# Patient Record
Sex: Female | Born: 1990 | Race: White | Hispanic: No | Smoking: Never smoker
Health system: Southern US, Community
[De-identification: ages and names within clinical notes are randomized; demographics above are authoritative.]

## PROBLEM LIST (undated history)

## (undated) DIAGNOSIS — F32A Depression, unspecified: Secondary | ICD-10-CM

## (undated) DIAGNOSIS — R0602 Shortness of breath: Secondary | ICD-10-CM

## (undated) DIAGNOSIS — E739 Lactose intolerance, unspecified: Secondary | ICD-10-CM

## (undated) DIAGNOSIS — F419 Anxiety disorder, unspecified: Secondary | ICD-10-CM

## (undated) DIAGNOSIS — G43909 Migraine, unspecified, not intractable, without status migrainosus: Secondary | ICD-10-CM

## (undated) DIAGNOSIS — G40909 Epilepsy, unspecified, not intractable, without status epilepticus: Secondary | ICD-10-CM

## (undated) DIAGNOSIS — F329 Major depressive disorder, single episode, unspecified: Secondary | ICD-10-CM

## (undated) DIAGNOSIS — B977 Papillomavirus as the cause of diseases classified elsewhere: Secondary | ICD-10-CM

## (undated) DIAGNOSIS — A0472 Enterocolitis due to Clostridium difficile, not specified as recurrent: Secondary | ICD-10-CM

## (undated) DIAGNOSIS — D069 Carcinoma in situ of cervix, unspecified: Secondary | ICD-10-CM

## (undated) DIAGNOSIS — E559 Vitamin D deficiency, unspecified: Secondary | ICD-10-CM

## (undated) DIAGNOSIS — Z23 Encounter for immunization: Secondary | ICD-10-CM

## (undated) DIAGNOSIS — R87619 Unspecified abnormal cytological findings in specimens from cervix uteri: Secondary | ICD-10-CM

## (undated) DIAGNOSIS — S42309A Unspecified fracture of shaft of humerus, unspecified arm, initial encounter for closed fracture: Secondary | ICD-10-CM

## (undated) DIAGNOSIS — E669 Obesity, unspecified: Secondary | ICD-10-CM

## (undated) HISTORY — DX: Epilepsy, unspecified, not intractable, without status epilepticus: G40.909

## (undated) HISTORY — DX: Encounter for immunization: Z23

## (undated) HISTORY — PX: NO PAST SURGERIES: SHX2092

## (undated) HISTORY — DX: Depression, unspecified: F32.A

## (undated) HISTORY — DX: Enterocolitis due to Clostridium difficile, not specified as recurrent: A04.72

## (undated) HISTORY — DX: Carcinoma in situ of cervix, unspecified: D06.9

## (undated) HISTORY — DX: Shortness of breath: R06.02

## (undated) HISTORY — DX: Unspecified fracture of shaft of humerus, unspecified arm, initial encounter for closed fracture: S42.309A

## (undated) HISTORY — DX: Anxiety disorder, unspecified: F41.9

## (undated) HISTORY — DX: Vitamin D deficiency, unspecified: E55.9

## (undated) HISTORY — DX: Unspecified abnormal cytological findings in specimens from cervix uteri: R87.619

## (undated) HISTORY — DX: Papillomavirus as the cause of diseases classified elsewhere: B97.7

## (undated) HISTORY — DX: Major depressive disorder, single episode, unspecified: F32.9

## (undated) HISTORY — DX: Lactose intolerance, unspecified: E73.9

---

## 2014-08-07 ENCOUNTER — Emergency Department (HOSPITAL_COMMUNITY)
Admission: EM | Admit: 2014-08-07 | Discharge: 2014-08-07 | Disposition: A | Discharge status: Home / Self Care | Payer: 59 | Attending: Emergency Medicine | Admitting: Emergency Medicine

## 2014-08-07 ENCOUNTER — Encounter (HOSPITAL_COMMUNITY): Payer: Self-pay | Admitting: Emergency Medicine

## 2014-08-07 DIAGNOSIS — R112 Nausea with vomiting, unspecified: Secondary | ICD-10-CM | POA: Diagnosis not present

## 2014-08-07 DIAGNOSIS — E669 Obesity, unspecified: Secondary | ICD-10-CM | POA: Insufficient documentation

## 2014-08-07 DIAGNOSIS — Z8679 Personal history of other diseases of the circulatory system: Secondary | ICD-10-CM | POA: Insufficient documentation

## 2014-08-07 DIAGNOSIS — R1084 Generalized abdominal pain: Secondary | ICD-10-CM | POA: Diagnosis not present

## 2014-08-07 DIAGNOSIS — R197 Diarrhea, unspecified: Secondary | ICD-10-CM | POA: Insufficient documentation

## 2014-08-07 DIAGNOSIS — Z3202 Encounter for pregnancy test, result negative: Secondary | ICD-10-CM | POA: Insufficient documentation

## 2014-08-07 DIAGNOSIS — Z880 Allergy status to penicillin: Secondary | ICD-10-CM | POA: Insufficient documentation

## 2014-08-07 DIAGNOSIS — Z793 Long term (current) use of hormonal contraceptives: Secondary | ICD-10-CM | POA: Diagnosis not present

## 2014-08-07 DIAGNOSIS — Z8619 Personal history of other infectious and parasitic diseases: Secondary | ICD-10-CM | POA: Insufficient documentation

## 2014-08-07 DIAGNOSIS — R109 Unspecified abdominal pain: Secondary | ICD-10-CM | POA: Diagnosis present

## 2014-08-07 HISTORY — DX: Migraine, unspecified, not intractable, without status migrainosus: G43.909

## 2014-08-07 HISTORY — DX: Obesity, unspecified: E66.9

## 2014-08-07 LAB — URINALYSIS, ROUTINE W REFLEX MICROSCOPIC
BILIRUBIN URINE: NEGATIVE
Glucose, UA: NEGATIVE mg/dL
Hgb urine dipstick: NEGATIVE
KETONES UR: 15 mg/dL — AB
LEUKOCYTES UA: NEGATIVE
NITRITE: NEGATIVE
PH: 6.5 (ref 5.0–8.0)
PROTEIN: NEGATIVE mg/dL
Specific Gravity, Urine: 1.027 (ref 1.005–1.030)
UROBILINOGEN UA: 0.2 mg/dL (ref 0.0–1.0)

## 2014-08-07 LAB — CBC WITH DIFFERENTIAL/PLATELET
BASOS PCT: 0 % (ref 0–1)
Basophils Absolute: 0 10*3/uL (ref 0.0–0.1)
Eosinophils Absolute: 0.1 10*3/uL (ref 0.0–0.7)
Eosinophils Relative: 0 % (ref 0–5)
HEMATOCRIT: 40.8 % (ref 36.0–46.0)
HEMOGLOBIN: 13.5 g/dL (ref 12.0–15.0)
Lymphocytes Relative: 10 % — ABNORMAL LOW (ref 12–46)
Lymphs Abs: 1.2 10*3/uL (ref 0.7–4.0)
MCH: 28.5 pg (ref 26.0–34.0)
MCHC: 33.1 g/dL (ref 30.0–36.0)
MCV: 86.3 fL (ref 78.0–100.0)
MONO ABS: 0.5 10*3/uL (ref 0.1–1.0)
MONOS PCT: 4 % (ref 3–12)
Neutro Abs: 10.7 10*3/uL — ABNORMAL HIGH (ref 1.7–7.7)
Neutrophils Relative %: 86 % — ABNORMAL HIGH (ref 43–77)
Platelets: 238 10*3/uL (ref 150–400)
RBC: 4.73 MIL/uL (ref 3.87–5.11)
RDW: 13.1 % (ref 11.5–15.5)
WBC: 12.6 10*3/uL — ABNORMAL HIGH (ref 4.0–10.5)

## 2014-08-07 LAB — COMPREHENSIVE METABOLIC PANEL
ALK PHOS: 54 U/L (ref 38–126)
ALT: 16 U/L (ref 14–54)
AST: 32 U/L (ref 15–41)
Albumin: 3 g/dL — ABNORMAL LOW (ref 3.5–5.0)
Anion gap: 13 (ref 5–15)
BILIRUBIN TOTAL: 1 mg/dL (ref 0.3–1.2)
BUN: 16 mg/dL (ref 6–20)
CHLORIDE: 104 mmol/L (ref 101–111)
CO2: 21 mmol/L — ABNORMAL LOW (ref 22–32)
Calcium: 8.3 mg/dL — ABNORMAL LOW (ref 8.9–10.3)
Creatinine, Ser: 0.69 mg/dL (ref 0.44–1.00)
GFR calc Af Amer: >60 mL/min (ref >60)
GFR calc non Af Amer: >60 mL/min (ref >60)
Glucose, Bld: 89 mg/dL (ref 70–99)
POTASSIUM: 4.8 mmol/L (ref 3.5–5.1)
Sodium: 138 mmol/L (ref 135–145)
Total Protein: 6.2 g/dL — ABNORMAL LOW (ref 6.5–8.1)

## 2014-08-07 LAB — PREGNANCY, URINE: PREG TEST UR: NEGATIVE

## 2014-08-07 LAB — LIPASE, BLOOD: Lipase: 22 U/L (ref 22–51)

## 2014-08-07 MED ORDER — SODIUM CHLORIDE 0.9 % IV BOLUS (SEPSIS)
1000.0000 mL | Freq: Once | INTRAVENOUS | Status: AC
  Administered 2014-08-07: 1000 mL via INTRAVENOUS

## 2014-08-07 MED ORDER — HYDROCODONE-ACETAMINOPHEN 5-325 MG PO TABS: ORAL_TABLET | ORAL | Status: DC

## 2014-08-07 MED ORDER — ONDANSETRON HCL 4 MG/2ML IJ SOLN
4.0000 mg | Freq: Once | INTRAMUSCULAR | Status: AC
  Administered 2014-08-07: 4 mg via INTRAVENOUS
  Filled 2014-08-07: qty 2

## 2014-08-07 MED ORDER — ONDANSETRON 4 MG PO TBDP: 4.0000 mg | ORAL_TABLET | Freq: Three times a day (TID) | ORAL | Status: DC | PRN

## 2014-08-07 MED ORDER — METRONIDAZOLE 500 MG PO TABS: 500.0000 mg | ORAL_TABLET | Freq: Three times a day (TID) | ORAL | Status: DC

## 2014-08-07 NOTE — ED Notes (Signed)
Pt. reports low abdominal pain with emesis and diarrhea onset this morning , denies fever or chills.

## 2014-08-07 NOTE — Discharge Instructions (Signed)
Please read and follow all provided instructions.  Your diagnoses today include:  1. Nausea vomiting and diarrhea   2. Generalized abdominal pain     Tests performed today include:  Blood counts and electrolytes - slightly high white blood cell count  Blood tests to check liver and kidney function  Blood tests to check pancreas function  Urine test to look for infection and pregnancy (in women)  Vital signs. See below for your results today.   Medications prescribed:   Metronidazole - antibiotic, start taking this if you have more than 4 episodes of watery diarrhea in the next 24 hours  You have been prescribed an antibiotic medicine: take the entire course of medicine even if you are feeling better. Stopping early can cause the antibiotic not to work. Do not drink alcohol when taking this medication.    Zofran (ondansetron) - for nausea and vomiting   Vicodin (hydrocodone/acetaminophen) - narcotic pain medication  DO NOT drive or perform any activities that require you to be awake and alert because this medicine can make you drowsy. BE VERY CAREFUL not to take multiple medicines containing Tylenol (also called acetaminophen). Doing so can lead to an overdose which can damage your liver and cause liver failure and possibly death.  Take any prescribed medications only as directed.  Home care instructions:   Follow any educational materials contained in this packet.  Follow-up instructions: Please follow-up with your primary care provider in the next 3 days for further evaluation of your symptoms.    Return instructions:  SEEK IMMEDIATE MEDICAL ATTENTION IF:  The pain does not go away or becomes severe   A temperature above 101F develops   Repeated vomiting occurs (multiple episodes)   The pain becomes localized to portions of the abdomen. The right side could possibly be appendicitis. In an adult, the left lower portion of the abdomen could be colitis or  diverticulitis.   Blood is being passed in stools or vomit (bright red or black tarry stools)   You develop chest pain, difficulty breathing, dizziness or fainting, or become confused, poorly responsive, or inconsolable (young children)  If you have any other emergent concerns regarding your health  Additional Information: Abdominal (belly) pain can be caused by many things. Your caregiver performed an examination and possibly ordered blood/urine tests and imaging (CT scan, x-rays, ultrasound). Many cases can be observed and treated at home after initial evaluation in the emergency department. Even though you are being discharged home, abdominal pain can be unpredictable. Therefore, you need a repeated exam if your pain does not resolve, returns, or worsens. Most patients with abdominal pain don't have to be admitted to the hospital or have surgery, but serious problems like appendicitis and gallbladder attacks can start out as nonspecific pain. Many abdominal conditions cannot be diagnosed in one visit, so follow-up evaluations are very important.  Your vital signs today were: BP 95/58 mmHg   Pulse 81   Temp(Src) 98.6 F (37 C) (Oral)   Resp 16   Ht 5\' 1"  (1.549 m)   Wt 257 lb (116.574 kg)   BMI 48.58 kg/m2   SpO2 98% If your blood pressure (bp) was elevated above 135/85 this visit, please have this repeated by your doctor within one month. --------------

## 2014-08-07 NOTE — ED Notes (Signed)
Pt reports pain at IV site. No reddness, coolness, swelling noted. Warm pack placed on site. Pt informed to call if it does not help.

## 2014-08-07 NOTE — ED Provider Notes (Signed)
CSN: [id number]     Arrival date & time 08/07/14  [id number] History   First MD Initiated Contact with Patient 08/07/14 0601     Chief Complaint  Patient presents with  . Abdominal Pain     (Consider location/radiation/quality/duration/timing/severity/associated sxs/prior Treatment) HPI Comments: Patient with no past surgical history presents with complaint of abdominal pain, nausea, vomiting, and diarrhea. Patient was diagnosed with C. difficile colitis after similar symptoms starting 3-4 weeks ago. Patient was seen after about 2 weeks of symptoms. She was started on an antibiotic which she took for a week and completed the course 4 days ago. Patient states that she felt better after taking this medication. Her symptoms started again early this morning. Pain is described as sharp, intermittent, nonradiating. Patient states that she has more vomiting now. Patient works at [employer] and is around a lot of people. No treatments prior to arrival. Patient denies fevers, blood in stool, blood in vomit. No family history of Crohn's disease or ulcerative colitis. No history of colonoscopy or other GI workup. The onset of this condition was acute. Aggravating factors: none. Alleviating factors: none.     The history is provided by the patient.    Past Medical History  Diagnosis Date  . Migraine   . Obesity    History reviewed. No pertinent past surgical history. No family history on file. History  Substance Use Topics  . Smoking status: Never Smoker   . Smokeless tobacco: Not on file  . Alcohol Use: Yes   OB History    No data available     Review of Systems  Constitutional: Negative for fever.  HENT: Negative for rhinorrhea and sore throat.   Eyes: Negative for redness.  Respiratory: Negative for cough.   Cardiovascular: Negative for chest pain.  Gastrointestinal: Positive for nausea, vomiting, abdominal pain and diarrhea. Negative for blood in stool.  Genitourinary: Negative for dysuria.   Musculoskeletal: Negative for myalgias.  Skin: Negative for rash.  Neurological: Negative for headaches.      Allergies  Penicillins  Home Medications   Prior to Admission medications   Medication Sig Start Date End Date Taking? Authorizing Provider  [facility] Take 1 tablet by mouth daily.  07/30/14  Yes Historical Provider, MD  LORazepam (ATIVAN) 1 MG tablet Take 1 mg by mouth every 6 (six) hours as needed for anxiety.  07/16/14  Yes Historical Provider, MD   BP 103/64 mmHg  Pulse 92  Temp(Src) 98.6 F (37 C) (Oral)  Resp 18  Ht 5\' 1"  (1.549 m)  Wt 257 lb (116.574 kg)  BMI 48.58 kg/m2  SpO2 100% Physical Exam  Constitutional: She appears well-developed and well-nourished.  HENT:  Head: Normocephalic and atraumatic.  Eyes: Conjunctivae are normal. Right eye exhibits no discharge. Left eye exhibits no discharge.  Neck: Normal range of motion. Neck supple.  Cardiovascular: Normal rate, regular rhythm and normal heart sounds.   No murmur heard. Pulmonary/Chest: Effort normal and breath sounds normal. No respiratory distress. She has no wheezes. She has no rales.  Abdominal: Soft. Bowel sounds are normal. She exhibits no distension. There is no tenderness. There is no rebound.  Neurological: She is alert.  Skin: Skin is warm and dry.  Psychiatric: She has a normal mood and affect.  Nursing note and vitals reviewed.   ED Course  Procedures (including critical care time) Labs Review Labs Reviewed  CBC WITH DIFFERENTIAL/PLATELET - Abnormal; Notable for the following:    WBC 12.6 (*)  Neutrophils Relative % 86 (*)    Neutro Abs 10.7 (*)    Lymphocytes Relative 10 (*)    All other components within normal limits  COMPREHENSIVE METABOLIC PANEL - Abnormal; Notable for the following:    CO2 21 (*)    Calcium 8.3 (*)    Total Protein 6.2 (*)    Albumin 3.0 (*)    All other components within normal limits  URINALYSIS, ROUTINE W REFLEX MICROSCOPIC - Abnormal;  Notable for the following:    Ketones, ur 15 (*)    All other components within normal limits  CLOSTRIDIUM DIFFICILE BY PCR  LIPASE, BLOOD  PREGNANCY, URINE    Imaging Review No results found.   EKG Interpretation None       6:36 AM Patient seen and examined. Work-up initiated. Medications ordered.   Vital signs reviewed and are as follows: BP 103/64 mmHg  Pulse 92  Temp(Src) 98.6 F (37 C) (Oral)  Resp 18  Ht 5\' 1"  (1.549 m)  Wt 257 lb (116.574 kg)  BMI 48.58 kg/m2  SpO2 100%  10:13 AM Patient's workup is unremarkable. She has not had any further bowel movements. No further vomiting. Patient states that she is feeling much better. We will discharge her to home. I will give her a prescription for Flagyl to take if she goes home and has greater than 4 episodes of watery diarrhea the next 24 hours. Counseled on clear liquid diet.   The patient was urged to return to the Emergency Department immediately with worsening of current symptoms, worsening abdominal pain, persistent vomiting, blood noted in stools, fever, or any other concerns. The patient verbalized understanding.   Patient counseled on use of narcotic pain medications. Counseled not to combine these medications with others containing tylenol. Urged not to drink alcohol, drive, or perform any other activities that requires focus while taking these medications. The patient verbalizes understanding and agrees with the plan.  Encouraged PCP follow-up for recheck in 3 days.    MDM   Final diagnoses:  Nausea vomiting and diarrhea  Generalized abdominal pain   Patient with recent diagnosis of C. difficile colitis, treated with likely Flagyl. Patient had returning symptoms this morning with abdominal pain, vomiting, diarrhea. The symptoms are well controlled in emergency department and the patient is feeling better now. She has had 0 episodes of diarrhea while in the ED. No concern for sepsis. Patient appears well,  nontoxic. Abdomen is soft and nontender at time of discharge.  It is unclear at this point if this represents recurrent C. difficile or viral gastroenteritis. I feel this will likely take more time to declare itself. I provided the patient with a prescription for Flagyl. She is instructed to take this if she has more than 4 watery stools over the next 24 hours. Otherwise, conservative management with clear liquids, brat diet, PCP follow-up. I have a low suspicion for a new diagnosis of inflammatory bowel disease however it is on the differential patients age. Patient seems reliable to return if worsening.  No dangerous or life-threatening conditions suspected or identified by history, physical exam, and by work-up. No indications for hospitalization identified.      Renne CriglerJoshua Carollee Nussbaumer, PA-C 08/07/14 1022  Layla MawKristen N Ward, DO 08/07/14 1446

## 2016-02-14 ENCOUNTER — Emergency Department: Payer: 59

## 2016-02-14 ENCOUNTER — Encounter: Payer: Self-pay | Admitting: Emergency Medicine

## 2016-02-14 ENCOUNTER — Emergency Department
Admission: EM | Admit: 2016-02-14 | Discharge: 2016-02-14 | Disposition: A | Discharge status: Home / Self Care | Payer: 59 | Attending: Student in an Organized Health Care Education/Training Program | Admitting: Student in an Organized Health Care Education/Training Program

## 2016-02-14 DIAGNOSIS — Y92410 Unspecified street and highway as the place of occurrence of the external cause: Secondary | ICD-10-CM | POA: Insufficient documentation

## 2016-02-14 DIAGNOSIS — Y9389 Activity, other specified: Secondary | ICD-10-CM | POA: Insufficient documentation

## 2016-02-14 DIAGNOSIS — R51 Headache: Secondary | ICD-10-CM | POA: Insufficient documentation

## 2016-02-14 DIAGNOSIS — Z79899 Other long term (current) drug therapy: Secondary | ICD-10-CM | POA: Insufficient documentation

## 2016-02-14 DIAGNOSIS — Y999 Unspecified external cause status: Secondary | ICD-10-CM | POA: Insufficient documentation

## 2016-02-14 LAB — POCT PREGNANCY, URINE: PREG TEST UR: NEGATIVE

## 2016-02-14 MED ORDER — NAPROXEN 500 MG PO TBEC: 500.0000 mg | DELAYED_RELEASE_TABLET | Freq: Two times a day (BID) | ORAL | 0 refills | Status: DC

## 2016-02-14 MED ORDER — CYCLOBENZAPRINE HCL 5 MG PO TABS: 5.0000 mg | ORAL_TABLET | Freq: Three times a day (TID) | ORAL | 0 refills | Status: DC | PRN

## 2016-02-14 MED ORDER — ONDANSETRON 8 MG PO TBDP
8.0000 mg | ORAL_TABLET | Freq: Once | ORAL | Status: AC
  Administered 2016-02-14: 8 mg via ORAL
  Filled 2016-02-14: qty 1

## 2016-02-14 MED ORDER — KETOROLAC TROMETHAMINE 60 MG/2ML IM SOLN
60.0000 mg | Freq: Once | INTRAMUSCULAR | Status: AC
  Administered 2016-02-14: 60 mg via INTRAMUSCULAR
  Filled 2016-02-14: qty 2

## 2016-02-14 NOTE — ED Notes (Signed)
Pt verbalized understanding of discharge instructions. NAD at this time. 
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
## 2016-02-17 MED ORDER — ONDANSETRON 4 MG PO TBDP
ORAL_TABLET | ORAL | Status: AC
  Filled 2016-02-17: qty 1
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
## 2016-07-14 ENCOUNTER — Ambulatory Visit (INDEPENDENT_AMBULATORY_CARE_PROVIDER_SITE_OTHER): Payer: 59 | Admitting: Family

## 2016-07-14 ENCOUNTER — Encounter: Payer: Self-pay | Admitting: Family

## 2016-07-14 VITALS — BP 110/90 | HR 96 | Temp 98.2°F | Ht 63.0 in | Wt 290.8 lb

## 2016-07-14 DIAGNOSIS — R109 Unspecified abdominal pain: Secondary | ICD-10-CM | POA: Diagnosis not present

## 2016-07-14 DIAGNOSIS — G43109 Migraine with aura, not intractable, without status migrainosus: Secondary | ICD-10-CM

## 2016-07-14 DIAGNOSIS — F419 Anxiety disorder, unspecified: Secondary | ICD-10-CM | POA: Diagnosis not present

## 2016-07-14 DIAGNOSIS — G43909 Migraine, unspecified, not intractable, without status migrainosus: Secondary | ICD-10-CM | POA: Insufficient documentation

## 2016-07-14 MED ORDER — DICYCLOMINE HCL 10 MG PO CAPS: 10.0000 mg | ORAL_CAPSULE | Freq: Two times a day (BID) | ORAL | 0 refills | Status: DC | PRN

## 2016-07-14 NOTE — Patient Instructions (Addendum)
Return for pap, and physical and fasting labs.   Pleasure seeing you

## 2016-07-14 NOTE — Progress Notes (Signed)
Pre visit review using our clinic review tool, if applicable. No additional management support is needed unless otherwise documented below in the visit note. 

## 2016-07-15 ENCOUNTER — Encounter: Payer: Self-pay | Admitting: Family

## 2016-07-15 NOTE — Assessment & Plan Note (Signed)
Unchanged. Discussed using Ativan 1-3 tablets once every 2 weeks as a bench mark for anxiety. We agreed that if anxiety were to worsen, again we would try other safer agents. Discussed long-term risks of benzodiazepines. A new controlled substance contract was signed.

## 2016-07-15 NOTE — Assessment & Plan Note (Signed)
Stable. Intermittent. Resolved with bentyl which she has been on for some time. Refilled. Will continue to monitor.

## 2016-07-15 NOTE — Assessment & Plan Note (Addendum)
Complex history unusual presentation for migraines including blurry vision at this time. HA's are similar to HA's in the past. Offered MRI today in patient politely declined; she would like to see neurologist first. We jointly agreed that a more formal evaluation of epilepsy which she was told she had several years ago is appropriate. We also discussed my concern of being on oral contraceptive pills and discuss alternative options including progesterone only, mirena or copper IUD. Referral placed to GYN for consideration of mirena.

## 2016-07-20 ENCOUNTER — Telehealth: Payer: Self-pay | Admitting: Obstetrics and Gynecology

## 2016-07-20 NOTE — Telephone Encounter (Signed)
Pt is being referred from [facility] (internal) for Interested in mirena as migraine with aura; concern for OCP.Marland Kitchen Lvm for pt call to be schedule

## 2016-07-21 NOTE — Telephone Encounter (Signed)
Called pt and left voicemail to call back to be schedule.x2

## 2016-07-22 NOTE — Telephone Encounter (Signed)
Letter to be sent out. Will contact [facility]

## 2016-07-22 NOTE — Telephone Encounter (Signed)
LVm for pt to call back to be schedule. x3

## 2016-08-11 ENCOUNTER — Encounter: Payer: 59 | Admitting: Obstetrics and Gynecology

## 2016-08-17 ENCOUNTER — Encounter: Payer: Self-pay | Admitting: Neurology

## 2016-08-17 ENCOUNTER — Ambulatory Visit (INDEPENDENT_AMBULATORY_CARE_PROVIDER_SITE_OTHER): Payer: 59 | Admitting: Neurology

## 2016-08-17 VITALS — BP 117/83 | HR 74 | Ht 63.0 in | Wt 290.8 lb

## 2016-08-17 DIAGNOSIS — G509 Disorder of trigeminal nerve, unspecified: Secondary | ICD-10-CM | POA: Diagnosis not present

## 2016-08-17 DIAGNOSIS — M792 Neuralgia and neuritis, unspecified: Secondary | ICD-10-CM | POA: Diagnosis not present
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
## 2016-08-17 NOTE — Patient Instructions (Addendum)
Remember to drink plenty of fluid, eat healthy meals and do not skip any meals. Try to eat protein with a every meal and eat a healthy snack such as fruit or nuts in between meals. Try to keep a regular sleep-wake schedule and try to exercise daily, particularly in the form of walking, 20-30 minutes a day, if you can.   As far as your medications are concerned, I would like to suggest:  Topiramate  As far as diagnostic testing: MRI brain  I would like to see you back in 6 months, sooner if we need to. Please call us with any interim questions, concerns, problems, updates or refill requests.   Our phone number is [phone]. We also have an after hours call service for urgent matters and there is a physician on-call for urgent questions. For any emergencies you know to call 911 or go to the nearest emergency room  Topiramate tablets What is this medicine? TOPIRAMATE (toe PYRE a mate) is used to treat seizures in adults or children with epilepsy. It is also used for the prevention of migraine headaches. This medicine may be used for other purposes; ask your health care provider or pharmacist if you have questions. COMMON BRAND NAME(S): Topamax, Topiragen What should I tell my health care provider before I take this medicine? They need to know if you have any of these conditions: -bleeding disorders -cirrhosis of the liver or liver disease -diarrhea -glaucoma -kidney stones or kidney disease -low blood counts, like low white cell, platelet, or red cell counts -lung disease like asthma, obstructive pulmonary disease, emphysema -metabolic acidosis -on a ketogenic diet -schedule for surgery or a procedure -suicidal thoughts, plans, or attempt; a previous suicide attempt by you or a family member -an unusual or allergic reaction to topiramate, other medicines, foods, dyes, or preservatives -pregnant or trying to get pregnant -breast-feeding How should I use this medicine? Take this  medicine by mouth with a glass of water. Follow the directions on the prescription label. Do not crush or chew. You may take this medicine with meals. Take your medicine at regular intervals. Do not take it more often than directed. Talk to your pediatrician regarding the use of this medicine in children. Special care may be needed. While this drug may be prescribed for children as young as 36 years of age for selected conditions, precautions do apply. Overdosage: If you think you have taken too much of this medicine contact a poison control center or emergency room at once. NOTE: This medicine is only for you. Do not share this medicine with others. What if I miss a dose? If you miss a dose, take it as soon as you can. If your next dose is to be taken in less than 6 hours, then do not take the missed dose. Take the next dose at your regular time. Do not take double or extra doses. What may interact with this medicine? Do not take this medicine with any of the following medications: -probenecid This medicine may also interact with the following medications: -acetazolamide -alcohol -amitriptyline -aspirin and aspirin-like medicines -birth control pills -certain medicines for depression -certain medicines for seizures -certain medicines that treat or prevent blood clots like warfarin, enoxaparin, dalteparin, apixaban, dabigatran, and rivaroxaban -digoxin -hydrochlorothiazide -lithium -medicines for pain, sleep, or muscle relaxation -metformin -methazolamide -NSAIDS, medicines for pain and inflammation, like ibuprofen or naproxen -pioglitazone -risperidone This list may not describe all possible interactions. Give your health care provider a list of all the  medicines, herbs, non-prescription drugs, or dietary supplements you use. Also tell them if you smoke, drink alcohol, or use illegal drugs. Some items may interact with your medicine. What should I watch for while using this  medicine? Visit your doctor or health care professional for regular checks on your progress. Do not stop taking this medicine suddenly. This increases the risk of seizures if you are using this medicine to control epilepsy. Wear a medical identification bracelet or chain to say you have epilepsy or seizures, and carry a card that lists all your medicines. This medicine can decrease sweating and increase your body temperature. Watch for signs of deceased sweating or fever, especially in children. Avoid extreme heat, hot baths, and saunas. Be careful about exercising, especially in hot weather. Contact your health care provider right away if you notice a fever or decrease in sweating. You should drink plenty of fluids while taking this medicine. If you have had kidney stones in the past, this will help to reduce your chances of forming kidney stones. If you have stomach pain, with nausea or vomiting and yellowing of your eyes or skin, call your doctor immediately. You may get drowsy, dizzy, or have blurred vision. Do not drive, use machinery, or do anything that needs mental alertness until you know how this medicine affects you. To reduce dizziness, do not sit or stand up quickly, especially if you are an older patient. Alcohol can increase drowsiness and dizziness. Avoid alcoholic drinks. If you notice blurred vision, eye pain, or other eye problems, seek medical attention at once for an eye exam. The use of this medicine may increase the chance of suicidal thoughts or actions. Pay special attention to how you are responding while on this medicine. Any worsening of mood, or thoughts of suicide or dying should be reported to your health care professional right away. This medicine may increase the chance of developing metabolic acidosis. If left untreated, this can cause kidney stones, bone disease, or slowed growth in children. Symptoms include breathing fast, fatigue, loss of appetite, irregular heartbeat,  or loss of consciousness. Call your doctor immediately if you experience any of these side effects. Also, tell your doctor about any surgery you plan on having while taking this medicine since this may increase your risk for metabolic acidosis. Birth control pills may not work properly while you are taking this medicine. Talk to your doctor about using an extra method of birth control. Women who become pregnant while using this medicine may enroll in the [place] American Antiepileptic Drug Pregnancy Registry by calling [phone]. This registry collects information about the safety of antiepileptic drug use during pregnancy. What side effects may I notice from receiving this medicine? Side effects that you should report to your doctor or health care professional as soon as possible: -allergic reactions like skin rash, itching or hives, swelling of the face, lips, or tongue -decreased sweating and/or rise in body temperature -depression -difficulty breathing, fast or irregular breathing patterns -difficulty speaking -difficulty walking or controlling muscle movements -hearing impairment -redness, blistering, peeling or loosening of the skin, including inside the mouth -tingling, pain or numbness in the hands or feet -unusual bleeding or bruising -unusually weak or tired -worsening of mood, thoughts or actions of suicide or dying Side effects that usually do not require medical attention (report to your doctor or health care professional if they continue or are bothersome): -altered taste -back pain, joint or muscle aches and pains -diarrhea, or constipation -headache -loss of appetite -  nausea -stomach upset, indigestion -tremors This list may not describe all possible side effects. Call your doctor for medical advice about side effects. You may report side effects to FDA at 1-800-FDA-1088. Where should I keep my medicine? Keep out of the reach of children. Store at room temperature  between 15 and 30 degrees C (59 and 86 degrees F) in a tightly closed container. Protect from moisture. Throw away any unused medicine after the expiration date. NOTE: This sheet is a summary. It may not cover all possible information. If you have questions about this medicine, talk to your doctor, pharmacist, or health care provider.  2018 Elsevier/Gold Standard (2013-03-19 23:17:57)

## 2016-08-18 ENCOUNTER — Encounter: Payer: Self-pay | Admitting: Neurology

## 2016-09-14 ENCOUNTER — Encounter: Payer: Self-pay | Admitting: Obstetrics and Gynecology

## 2016-09-14 ENCOUNTER — Ambulatory Visit (INDEPENDENT_AMBULATORY_CARE_PROVIDER_SITE_OTHER): Payer: 59 | Admitting: Obstetrics and Gynecology

## 2016-09-14 VITALS — BP 112/80 | HR 99 | Ht 62.0 in | Wt 287.0 lb

## 2016-09-14 DIAGNOSIS — Z3009 Encounter for other general counseling and advice on contraception: Secondary | ICD-10-CM | POA: Diagnosis not present

## 2016-09-14 NOTE — Progress Notes (Signed)
Obstetrics & Gynecology Office Visit   Chief Complaint:  Chief Complaint  Patient presents with  . Contraception    Discuss IUD    History of Present Illness: Patient is a 26 y.o. G0P0000 presenting for contraception consult.  She is currently on OCP (estrogen/progesterone) and desiring to start discuss Mirena vs other progestin only options.  She has a past medical history significant for migraine without aura.  She specifically denies a history of migraine with aura, chronic hypertension, history of DVT/PE and smoking.  Reported No LMP recorded. Patient is not currently having periods (Reason: Oral contraceptives).. Currently doing continuous dosing of OCP for cycle supression.   Review of Systems: 10 point review of systems negative unless otherwise noted in HPI  Past Medical History:  Past Medical History:  Diagnosis Date  . Arm fracture   . C. difficile colitis   . Depression   . HPV in female   . Migraine   . Obesity   . Vitamin D deficiency     Past Surgical History:  Past Surgical History:  Procedure Laterality Date  . NO PAST SURGERIES      Gynecologic History: No LMP recorded. Patient is not currently having periods (Reason: Oral contraceptives).  Obstetric History: G0P0000  Family History:  Family History  Problem Relation Age of Onset  . Arthritis Mother   . Mental illness Mother   . Cancer Mother        skin   . Migraines Mother   . Alcohol abuse Father   . Hypertension Father   . Mental illness Father   . Cancer Maternal Aunt        Bone  . Arthritis Maternal Grandmother   . Stroke Maternal Grandmother   . Mental illness Maternal Grandmother   . Skin cancer Maternal Grandmother   . Cancer Maternal Grandfather        lung  . Mental illness Maternal Grandfather   . Cancer Paternal Grandmother        Skin    Social History:  Social History   Social History  . Marital status: Married    Spouse name: N/A  . Number of children: 0  .  Years of education: 12   Occupational History  . [employer]    Social History Main Topics  . Smoking status: Never Smoker  . Smokeless tobacco: Never Used  . Alcohol use 0.6 oz/week    1 Standard drinks or equivalent per week  . Drug use: No  . Sexual activity: Yes    Birth control/ protection: Pill   Other Topics Concern  . Not on file   Social History Narrative   Married   Works at [employer], [employer]   Right-handed   Caffeine: tea, soda       Allergies:  Allergies  Allergen Reactions  . Culturelle [Lactobacillus] Rash  . Penicillins Rash    Medications: Prior to Admission medications   Medication Sig Start Date End Date Taking? Authorizing Provider  augmented betamethasone dipropionate (DIPROLENE-AF) 0.05 % ointment Apply topically 2 (two) times daily.   Yes [provider]  [facility] Take 1 tablet by mouth daily.  07/30/14  Yes [provider]  dicyclomine (BENTYL) 10 MG capsule Take 1 capsule (10 mg total) by mouth 2 (two) times daily as needed for spasms. 07/14/16  Yes Arnett, Lyn RecordsMargaret G, FNP  LORazepam (ATIVAN) 1 MG tablet Take 1 mg by mouth every 6 (six) hours as needed for anxiety.  07/16/14  Yes [provider]    Physical Exam Vitals:  Vitals:   09/14/16 1429  BP: 112/80  Pulse: 99   No LMP recorded. Patient is not currently having periods (Reason: Oral contraceptives).  General: NAD HEENT: normocephalic, anicteric Pulmonary: No increased work of breathing Extremities: no edema, erythema, or tenderness Neurologic: Grossly intact Psychiatric: mood appropriate, affect full  Female chaperone present for pelvic and breast  portions of the physical exam  Assessment: 26 y.o. G0P0000 contraception consults  Plan: Problem List Items Addressed This Visit    None     Reviewed all forms of birth control options available including abstinence; over the counter/barrier methods; hormonal contraceptive medication including pill,  patch, ring, injection,contraceptive implant; hormonal and nonhormonal IUDs; permanent sterilization options including vasectomy and the various tubal sterilization modalities. Risks and benefits reviewed.  Questions were answered.  Information was given to patient to review.   We discussed the WHO/CDC contraceptive guidelines.  We do know that estrogen is associated with worsening of migraines.  This is likely dose dependent.  She is currently on a pill, we discussed switching to representing a 1/3 dose reduction.  Also discussed that estrogen exposure with unintended pregnancy is several fold higher than what she is exposed to with OCP.  We did discuss IUD, depo provera, or progestin only pills.  She is interested in conceiving within the next year which year just  clsoed on house  Samples of loloestrin given.  Follow up 3 months to assess response  A total of 15 minutes were spent in face-to-face contact with the patient during this encounter with over half of that time devoted to counseling and coordination of care.

## 2016-09-14 NOTE — Patient Instructions (Signed)
Oral Contraception Information Oral contraceptive pills (OCPs) are medicines taken to prevent pregnancy. OCPs work by preventing the ovaries from releasing eggs. The hormones in OCPs also cause the cervical mucus to thicken, preventing the sperm from entering the uterus. The hormones also cause the uterine lining to become thin, not allowing a fertilized egg to attach to the inside of the uterus. OCPs are highly effective when taken exactly as prescribed. However, OCPs do not prevent sexually transmitted diseases (STDs). Safe sex practices, such as using condoms along with the pill, can help prevent STDs. Before taking the pill, you may have a physical exam and Pap test. Your health care provider may order blood tests. The health care provider will make sure you are a good candidate for oral contraception. Discuss with your health care provider the possible side effects of the OCP you may be prescribed. When starting an OCP, it can take 2 to 3 months for the body to adjust to the changes in hormone levels in your body. Types of oral contraception  The combination pill-This pill contains estrogen and progestin (synthetic progesterone) hormones. The combination pill comes in 21-day, 28-day, or 91-day packs. Some types of combination pills are meant to be taken continuously (365-day pills). With 21-day packs, you do not take pills for 7 days after the last pill. With 28-day packs, the pill is taken every day. The last 7 pills are without hormones. Certain types of pills have more than 21 hormone-containing pills. With 91-day packs, the first 84 pills contain both hormones, and the last 7 pills contain no hormones or contain estrogen only.  The minipill-This pill contains the progesterone hormone only. The pill is taken every day continuously. It is very important to take the pill at the same time each day. The minipill comes in packs of 28 pills. All 28 pills contain the hormone. Advantages of oral  contraceptive pills  Decreases premenstrual symptoms.  Treats menstrual period cramps.  Regulates the menstrual cycle.  Decreases a heavy menstrual flow.  May treatacne, depending on the type of pill.  Treats abnormal uterine bleeding.  Treats polycystic ovarian syndrome.  Treats endometriosis.  Can be used as emergency contraception. Things that can make oral contraceptive pills less effective OCPs can be less effective if:  You forget to take the pill at the same time every day.  You have a stomach or intestinal disease that lessens the absorption of the pill.  You take OCPs with other medicines that make OCPs less effective, such as antibiotics, certain HIV medicines, and some seizure medicines.  You take expired OCPs.  You forget to restart the pill on day 7, when using the packs of 21 pills.  Risks associated with oral contraceptive pills Oral contraceptive pills can sometimes cause side effects, such as:  Headache.  Nausea.  Breast tenderness.  Irregular bleeding or spotting.  Combination pills are also associated with a small increased risk of:  Blood clots.  Heart attack.  Stroke.  This information is not intended to replace advice given to you by your health care provider. Make sure you discuss any questions you have with your health care provider. Document Released: 06/05/2002 Document Revised: 08/21/2015 Document Reviewed: 09/03/2012 Elsevier Interactive Patient Education  2018 [employer] Inc.  

## 2016-12-14 ENCOUNTER — Telehealth: Payer: Self-pay

## 2016-12-14 ENCOUNTER — Other Ambulatory Visit: Payer: Self-pay | Admitting: Obstetrics and Gynecology

## 2016-12-14 MED ORDER — NORETHIN-ETH ESTRAD-FE BIPHAS 1 MG-10 MCG / 10 MCG PO TABS: 1.0000 | ORAL_TABLET | Freq: Every day | ORAL | 3 refills | Status: DC

## 2016-12-14 NOTE — Telephone Encounter (Signed)
Pt was given sample pack of bc and told to call with the one she likes and [facility] would call in rx for her.  Pt likes the Lo Loestrin FE and would like it called in to [facility].  [phone]

## 2016-12-14 NOTE — Telephone Encounter (Signed)
Please advise 

## 2016-12-14 NOTE — Telephone Encounter (Signed)
Has been sent.

## 2017-02-14 NOTE — Progress Notes (Signed)
Subjective:    Patient ID: Lisa Logan, female    DOB: [birth year], 26 y.o.   MRN: [id number]  HPI:  [name] presents to establish as a new pt.  She is a very pleasant 26 year old female.  PMH: Anxiety, Migraines, Obesity, and lactose intolerance.  She estimates to have 3-4 migraines/day, treated with OTC Advil Migraine.  She is followed by Neurology and they have not recommended triptans or any other meds to treat HAs. She has suffered from anxiety since highshool and has been on SSRIs and benzodiazepines in the past-tolerated well. She denies tobacco/execessive ETOH use.  She estimates to drink 20 ox water a day and doesn't follow a heart healthy diet.  She is married and they recently purchased a home and she is taking a sabbatical from work. Of Note:  Her husband has suffered from severe anxiety/depression with suicide attempts in the past.  Patient Care Team    Relationship Specialty Notifications Start End  Lisa Logan, Lisa D, NP PCP - General Family Medicine  01/26/17   Lisa Logan, Lisa K, MD Consulting Physician Neurology  02/15/17   Lisa Logan, Lisa Logan    02/15/17 02/15/17  Lisa Logan, Lisa D, MD Attending Physician Obstetrics and Gynecology  02/15/17     Patient Active Problem List   Diagnosis Date Noted  . Migraine 07/14/2016  . Abdominal cramping 07/14/2016  . Anxiety 07/14/2016     Past Medical History:  Diagnosis Date  . Arm fracture   . C. difficile colitis   . Depression   . HPV in female   . Migraine   . Obesity   . Vitamin Logan deficiency      Past Surgical History:  Procedure Laterality Date  . NO PAST SURGERIES       Family History  Problem Relation Age of Onset  . Arthritis Mother   . Mental illness Mother   . Cancer Mother        skin   . Migraines Mother   . Depression Mother   . Alcohol abuse Father   . Hypertension Father   . Mental illness Father   . Depression Father   . Cancer Maternal Aunt        Bone  . Depression Maternal Aunt   .  Arthritis Maternal Grandmother   . Stroke Maternal Grandmother   . Mental illness Maternal Grandmother   . Skin cancer Maternal Grandmother   . Cancer Maternal Grandmother        skin  . Cancer Maternal Grandfather        lung  . Mental illness Maternal Grandfather   . Cancer Paternal Grandmother        Skin  . Depression Sister   . Depression Brother   . Cancer Maternal Uncle        skin  . Depression Maternal Uncle      Social History   Substance and Sexual Activity  Drug Use No     Social History   Substance and Sexual Activity  Alcohol Use Yes   Comment: occassional     Social History   Tobacco Use  Smoking Status Never Smoker  Smokeless Tobacco Never Used     Outpatient Encounter Medications as of 02/15/2017  Medication Sig Note  . augmented betamethasone dipropionate (DIPROLENE-AF) 0.05 % ointment Apply topically 2 (two) times daily.   . Cholecalciferol (VITAMIN D3) 10000 units TABS Take 2 Cans daily by mouth.   . dicyclomine (BENTYL) 10 MG capsule Take 1  capsule (10 mg total) by mouth 2 (two) times daily as needed for spasms.   Marland Kitchen. LORazepam (ATIVAN) 1 MG tablet 1 tablet by mouth every 8 hrs as needed for acute anxiety   . Norethindrone-Ethinyl Estradiol-Fe Biphas (LO LOESTRIN FE) 1 MG-10 MCG / 10 MCG tablet Take 1 tablet by mouth daily.   . [DISCONTINUED] LORazepam (ATIVAN) 1 MG tablet Take 1 mg by mouth every 6 (six) hours as needed for anxiety.  08/07/2014: ....  . [DISCONTINUED] LORazepam (ATIVAN) 1 MG tablet Take 2 mg every 8 (eight) hours by mouth.   Griffith Citron. CYCLAFEM 1/35 tablet Take 1 tablet by mouth daily.  08/07/2014: ....  . sertraline (ZOLOFT) 50 MG tablet Take 1 tablet (50 mg total) daily by mouth.    No facility-administered encounter medications on file as of 02/15/2017.     Allergies: Culturelle [lactobacillus] and Penicillins  Body mass index is 54.93 kg/m.  Blood pressure 131/63, pulse 86, height 5\' 2"  (1.575 m), weight (!) 300 lb 4.8 oz  (136.2 kg).     Review of Systems  Constitutional: Positive for fatigue. Negative for activity change, appetite change, chills, diaphoresis, fever and unexpected weight change.  Eyes: Negative for visual disturbance.  Respiratory: Negative for cough, chest tightness, shortness of breath, wheezing and stridor.   Cardiovascular: Negative for chest pain, palpitations and leg swelling.  Gastrointestinal: Negative for abdominal distention, abdominal pain, blood in stool, constipation, diarrhea and rectal pain.  Endocrine: Negative for cold intolerance, heat intolerance, polydipsia, polyphagia and polyuria.  Genitourinary: Negative for difficulty urinating, flank pain and hematuria.  Musculoskeletal: Negative for arthralgias, back pain, gait problem, joint swelling, myalgias, neck pain and neck stiffness.  Skin: Negative for color change, pallor, rash and wound.  Neurological: Positive for headaches. Negative for dizziness, tremors and weakness.  Hematological: Does not bruise/bleed easily.  Psychiatric/Behavioral: Positive for sleep disturbance. Negative for decreased concentration, dysphoric mood, self-injury and suicidal ideas. The patient is nervous/anxious. The patient is not hyperactive.        Objective:   Physical Exam  Constitutional: She is oriented to person, place, and time. She appears well-developed and well-nourished. No distress.  Cardiovascular: Normal rate, regular rhythm, normal heart sounds and intact distal pulses.  No murmur heard. Pulmonary/Chest: Effort normal and breath sounds normal. No respiratory distress. She has no wheezes. She has no rales. She exhibits no tenderness.  Neurological: She is alert and oriented to person, place, and time.  Skin: Skin is warm and dry. No rash noted. She is not diaphoretic. No erythema. No pallor.  Psychiatric: She has a normal mood and affect. Her behavior is normal. Judgment and thought content normal.  Nursing note and vitals  reviewed.         Assessment & Plan:   1. Anxiety   2. Healthcare maintenance   3. Morbid obesity (HCC)   4. Migraine with aura and without status migrainosus, not intractable     Migraine Estimates 3-4 migraines/month, treated with OTC Advil Migraine. Increase water. Continue with Neurology as directed.   Healthcare maintenance Please take all medications as directed. Mental health referral placed. Increase water intake, strive for at least gallon/day.   Follow Heart Healthy diet Increase regular exercise.  Recommend at least 30 minutes daily, 5 days per week of walking, jogging, biking, swimming, YouTube/Pinterest workout videos. Please follow-up in 4 weeks to determine how the medications are working. Recommend a full physical this winter.  Anxiety Started [place] Sertraline 50mg  daily [place]  reviewed- no contraindications noted. Lorazepam 1mg  Q8H PRN acute anxiety provided. Recommend regular exercise to help reduce overall stress/anxiety  Morbid obesity (HCC) Increase water intake, strive for at least gallon/day.   Follow Heart Healthy diet Increase regular exercise.  Recommend at least 30 minutes daily, 5 days per week of walking, jogging, biking, swimming, YouTube/Pinterest workout videos. Discussed at length the importance of reducing wt to prevent chronic med conditions- diabetes, heart disease, HTN, joint pain.    FOLLOW-UP:  Return in about 4 weeks (around 03/15/2017) for Regular Follow Up, Evaluate Medication Effectiveness, Anxiety.

## 2017-02-15 ENCOUNTER — Ambulatory Visit: Payer: 59 | Admitting: Adult Health

## 2017-02-15 ENCOUNTER — Encounter: Payer: Self-pay | Admitting: Adult Health

## 2017-02-15 VITALS — BP 131/63 | HR 86 | Ht 62.0 in | Wt 300.3 lb

## 2017-02-15 DIAGNOSIS — F419 Anxiety disorder, unspecified: Secondary | ICD-10-CM

## 2017-02-15 DIAGNOSIS — G43109 Migraine with aura, not intractable, without status migrainosus: Secondary | ICD-10-CM

## 2017-02-15 DIAGNOSIS — Z0000 Encounter for general adult medical examination without abnormal findings: Secondary | ICD-10-CM | POA: Diagnosis not present

## 2017-02-15 DIAGNOSIS — E66813 Obesity, class 3: Secondary | ICD-10-CM | POA: Insufficient documentation

## 2017-02-15 MED ORDER — LORAZEPAM 1 MG PO TABS: ORAL_TABLET | ORAL | 0 refills | Status: DC

## 2017-02-15 MED ORDER — SERTRALINE HCL 50 MG PO TABS: 50.0000 mg | ORAL_TABLET | Freq: Every day | ORAL | 0 refills | Status: DC

## 2017-02-15 NOTE — Assessment & Plan Note (Signed)
Please take all medications as directed. Mental health referral placed. Increase water intake, strive for at least gallon/day.   Follow Heart Healthy diet Increase regular exercise.  Recommend at least 30 minutes daily, 5 days per week of walking, jogging, biking, swimming, YouTube/Pinterest workout videos. Please follow-up in 4 weeks to determine how the medications are working. Recommend a full physical this winter.

## 2017-02-15 NOTE — Assessment & Plan Note (Signed)
Started on Sertraline 50mg  daily [place]- no contraindications noted. Lorazepam 1mg  Q8H PRN acute anxiety provided. Recommend regular exercise to help reduce overall stress/anxiety

## 2017-02-15 NOTE — Patient Instructions (Signed)
Heart-Healthy Eating Plan Many factors influence your heart health, including eating and exercise habits. Heart (coronary) risk increases with abnormal blood fat (lipid) levels. Heart-healthy meal planning includes limiting unhealthy fats, increasing healthy fats, and making other small dietary changes. This includes maintaining a healthy body weight to help keep lipid levels within a normal range. What is my plan? Your health care provider recommends that you:  Get no more than __25___% of the total calories in your daily diet from fat.  Limit your intake of saturated fat to less than ___5__% of your total calories each day.  Limit the amount of cholesterol in your diet to less than ___300___ mg per day.  What types of fat should I choose?  Choose healthy fats more often. Choose monounsaturated and polyunsaturated fats, such as olive oil and canola oil, flaxseeds, walnuts, almonds, and seeds.  Eat more omega-3 fats. Good choices include salmon, mackerel, sardines, tuna, flaxseed oil, and ground flaxseeds. Aim to eat fish at least two times each week.  Limit saturated fats. Saturated fats are primarily found in animal products, such as meats, butter, and cream. Plant sources of saturated fats include palm oil, palm kernel oil, and coconut oil.  Avoid foods with partially hydrogenated oils in them. These contain trans fats. Examples of foods that contain trans fats are stick margarine, some tub margarines, cookies, crackers, and other baked goods. What general guidelines do I need to follow?  Check food labels carefully to identify foods with trans fats or high amounts of saturated fat.  Fill one half of your plate with vegetables and green salads. Eat 4-5 servings of vegetables per day. A serving of vegetables equals 1 cup of raw leafy vegetables,  cup of raw or cooked cut-up vegetables, or  cup of vegetable juice.  Fill one fourth of your plate with whole grains. Look for the word  "whole" as the first word in the ingredient list.  Fill one fourth of your plate with lean protein foods.  Eat 4-5 servings of fruit per day. A serving of fruit equals one medium whole fruit,  cup of dried fruit,  cup of fresh, frozen, or canned fruit, or  cup of 100% fruit juice.  Eat more foods that contain soluble fiber. Examples of foods that contain this type of fiber are apples, broccoli, carrots, beans, peas, and barley. Aim to get 20-30 g of fiber per day.  Eat more home-cooked food and less restaurant, buffet, and fast food.  Limit or avoid alcohol.  Limit foods that are high in starch and sugar.  Avoid fried foods.  Cook foods by using methods other than frying. Baking, boiling, grilling, and broiling are all great options. Other fat-reducing suggestions include: ? Removing the skin from poultry. ? Removing all visible fats from meats. ? Skimming the fat off of stews, soups, and gravies before serving them. ? Steaming vegetables in water or broth.  Lose weight if you are overweight. Losing just 5-10% of your initial body weight can help your overall health and prevent diseases such as diabetes and heart disease.  Increase your consumption of nuts, legumes, and seeds to 4-5 servings per week. One serving of dried beans or legumes equals  cup after being cooked, one serving of nuts equals 1 ounces, and one serving of seeds equals  ounce or 1 tablespoon.  You may need to monitor your salt (sodium) intake, especially if you have high blood pressure. Talk with your health care provider or dietitian to get  more information about reducing sodium. What foods can I eat? Grains  Breads, including French, white, pita, wheat, raisin, rye, oatmeal, and Italian. Tortillas that are neither fried nor made with lard or trans fat. Low-fat rolls, including hotdog and hamburger buns and English muffins. Biscuits. Muffins. Waffles. Pancakes. Light popcorn. Whole-grain cereals. Flatbread.  Melba toast. Pretzels. Breadsticks. Rusks. Low-fat snacks and crackers, including oyster, saltine, matzo, graham, animal, and rye. Rice and pasta, including brown rice and those that are made with whole wheat. Vegetables All vegetables. Fruits All fruits, but limit coconut. Meats and Other Protein Sources Lean, well-trimmed beef, veal, pork, and lamb. Chicken and turkey without skin. All fish and shellfish. Wild duck, rabbit, pheasant, and venison. Egg whites or low-cholesterol egg substitutes. Dried beans, peas, lentils, and tofu.Seeds and most nuts. Dairy Low-fat or nonfat cheeses, including ricotta, string, and mozzarella. Skim or 1% milk that is liquid, powdered, or evaporated. Buttermilk that is made with low-fat milk. Nonfat or low-fat yogurt. Beverages Mineral water. Diet carbonated beverages. Sweets and Desserts Sherbets and fruit ices. Honey, jam, marmalade, jelly, and syrups. Meringues and gelatins. Pure sugar candy, such as hard candy, jelly beans, gumdrops, mints, marshmallows, and small amounts of dark chocolate. Angel food cake. Eat all sweets and desserts in moderation. Fats and Oils Nonhydrogenated (trans-free) margarines. Vegetable oils, including soybean, sesame, sunflower, olive, peanut, safflower, corn, canola, and cottonseed. Salad dressings or mayonnaise that are made with a vegetable oil. Limit added fats and oils that you use for cooking, baking, salads, and as spreads. Other Cocoa powder. Coffee and tea. All seasonings and condiments. The items listed above may not be a complete list of recommended foods or beverages. Contact your dietitian for more options. What foods are not recommended? Grains Breads that are made with saturated or trans fats, oils, or whole milk. Croissants. Butter rolls. Cheese breads. Sweet rolls. Donuts. Buttered popcorn. Chow mein noodles. High-fat crackers, such as cheese or butter crackers. Meats and Other Protein Sources Fatty meats, such  as hotdogs, short ribs, sausage, spareribs, bacon, ribeye roast or steak, and mutton. High-fat deli meats, such as salami and bologna. Caviar. Domestic duck and goose. Organ meats, such as kidney, liver, sweetbreads, brains, gizzard, chitterlings, and heart. Dairy Cream, sour cream, cream cheese, and creamed cottage cheese. Whole milk cheeses, including blue (bleu), Monterey Jack, Brie, [place], American, Havarti, Swiss, cheddar, Camembert, and [place]. Whole or 2% milk that is liquid, evaporated, or condensed. Whole buttermilk. Cream sauce or high-fat cheese sauce. Yogurt that is made from whole milk. Beverages Regular sodas and drinks with added sugar. Sweets and Desserts Frosting. Pudding. Cookies. Cakes other than angel food cake. Candy that has milk chocolate or white chocolate, hydrogenated fat, butter, coconut, or unknown ingredients. Buttered syrups. Full-fat ice cream or ice cream drinks. Fats and Oils Gravy that has suet, meat fat, or shortening. Cocoa butter, hydrogenated oils, palm oil, coconut oil, palm kernel oil. These can often be found in baked products, candy, fried foods, nondairy creamers, and whipped toppings. Solid fats and shortenings, including bacon fat, salt pork, lard, and butter. Nondairy cream substitutes, such as coffee creamers and sour cream substitutes. Salad dressings that are made of unknown oils, cheese, or sour cream. The items listed above may not be a complete list of foods and beverages to avoid. Contact your dietitian for more information. This information is not intended to replace advice given to you by your health care provider. Make sure you discuss any questions you have with your health care   provider. Document Released: 12/23/2007 Document Revised: 10/03/2015 Document Reviewed: 09/06/2013 Elsevier Interactive Patient Education  2017 [place].   Generalized Anxiety Disorder, Adult Generalized anxiety disorder (GAD) is a mental health disorder. People  with this condition constantly worry about everyday events. Unlike normal anxiety, worry related to GAD is not triggered by a specific event. These worries also do not fade or get better with time. GAD interferes with life functions, including relationships, work, and school. GAD can vary from mild to severe. People with severe GAD can have intense waves of anxiety with physical symptoms (panic attacks). What are the causes? The exact cause of GAD is not known. What increases the risk? This condition is more likely to develop in:  Women.  People who have a family history of anxiety disorders.  People who are very shy.  People who experience very stressful life events, such as the death of a loved one.  People who have a very stressful family environment.  What are the signs or symptoms? People with GAD often worry excessively about many things in their lives, such as their health and family. They may also be overly concerned about:  Doing well at work.  Being on time.  Natural disasters.  Friendships.  Physical symptoms of GAD include:  Fatigue.  Muscle tension or having muscle twitches.  Trembling or feeling shaky.  Being easily startled.  Feeling like your heart is pounding or racing.  Feeling out of breath or like you cannot take a deep breath.  Having trouble falling asleep or staying asleep.  Sweating.  Nausea, diarrhea, or irritable bowel syndrome (IBS).  Headaches.  Trouble concentrating or remembering facts.  Restlessness.  Irritability.  How is this diagnosed? Your health care provider can diagnose GAD based on your symptoms and medical history. You will also have a physical exam. The health care provider will ask specific questions about your symptoms, including how severe they are, when they started, and if they come and go. Your health care provider may ask you about your use of alcohol or drugs, including prescription medicines. Your health care  provider may refer you to a mental health specialist for further evaluation. Your health care provider will do a thorough examination and may perform additional tests to rule out other possible causes of your symptoms. To be diagnosed with GAD, a person must have anxiety that:  Is out of his or her control.  Affects several different aspects of his or her life, such as work and relationships.  Causes distress that makes him or her unable to take part in normal activities.  Includes at least three physical symptoms of GAD, such as restlessness, fatigue, trouble concentrating, irritability, muscle tension, or sleep problems.  Before your health care provider can confirm a diagnosis of GAD, these symptoms must be present more days than they are not, and they must last for six months or longer. How is this treated? The following therapies are usually used to treat GAD:  Medicine. Antidepressant medicine is usually prescribed for long-term daily control. Antianxiety medicines may be added in severe cases, especially when panic attacks occur.  Talk therapy (psychotherapy). Certain types of talk therapy can be helpful in treating GAD by providing support, education, and guidance. Options include: ? Cognitive behavioral therapy (CBT). People learn coping skills and techniques to ease their anxiety. They learn to identify unrealistic or negative thoughts and behaviors and to replace them with positive ones. ? Acceptance and commitment therapy (ACT). This treatment teaches people  how to be mindful as a way to cope with unwanted thoughts and feelings. ? Biofeedback. This process trains you to manage your body's response (physiological response) through breathing techniques and relaxation methods. You will work with a therapist while machines are used to monitor your physical symptoms.  Stress management techniques. These include yoga, meditation, and exercise.  A mental health specialist can help  determine which treatment is best for you. Some people see improvement with one type of therapy. However, other people require a combination of therapies. Follow these instructions at home:  Take over-the-counter and prescription medicines only as told by your health care provider.  Try to maintain a normal routine.  Try to anticipate stressful situations and allow extra time to manage them.  Practice any stress management or self-calming techniques as taught by your health care provider.  Do not punish yourself for setbacks or for not making progress.  Try to recognize your accomplishments, even if they are small.  Keep all follow-up visits as told by your health care provider. This is important. Contact a health care provider if:  Your symptoms do not get better.  Your symptoms get worse.  You have signs of depression, such as: ? A persistently sad, cranky, or irritable mood. ? Loss of enjoyment in activities that used to bring you joy. ? Change in weight or eating. ? Changes in sleeping habits. ? Avoiding friends or family members. ? Loss of energy for normal tasks. ? Feelings of guilt or worthlessness. Get help right away if:  You have serious thoughts about hurting yourself or others. If you ever feel like you may hurt yourself or others, or have thoughts about taking your own life, get help right away. You can go to your nearest emergency department or call:  Your local emergency services (911 in the [place]  A suicide crisis helpline, such as the National Suicide Prevention Lifeline at (818)202-36981-564-614-9829. This is open 24 hours a day.  Summary  Generalized anxiety disorder (GAD) is a mental health disorder that involves worry that is not triggered by a specific event.  People with GAD often worry excessively about many things in their lives, such as their health and family.  GAD may cause physical symptoms such as restlessness, trouble concentrating, sleep problems,  frequent sweating, nausea, diarrhea, headaches, and trembling or muscle twitching.  A mental health specialist can help determine which treatment is best for you. Some people see improvement with one type of therapy. However, other people require a combination of therapies. This information is not intended to replace advice given to you by your health care provider. Make sure you discuss any questions you have with your health care provider. Document Released: 07/10/2012 Document Revised: 02/03/2016 Document Reviewed: 02/03/2016 Elsevier Interactive Patient Education  [employer]2018 [employer] Inc.  Please take all medications as directed. Mental health referral placed. Increase water intake, strive for at least gallon/day.   Follow Heart Healthy diet Increase regular exercise.  Recommend at least 30 minutes daily, 5 days per week of walking, jogging, biking, swimming, YouTube/Pinterest workout videos. Please follow-up in 4 weeks to determine how the medications are working. Recommend a full physical this winter. WELCOME TO THE PRACTICE!

## 2017-02-15 NOTE — Assessment & Plan Note (Signed)
Increase water intake, strive for at least gallon/day.   Follow Heart Healthy diet Increase regular exercise.  Recommend at least 30 minutes daily, 5 days per week of walking, jogging, biking, swimming, YouTube/Pinterest workout videos. Discussed at length the importance of reducing wt to prevent chronic med conditions- diabetes, heart disease, HTN, joint pain.

## 2017-02-15 NOTE — Assessment & Plan Note (Signed)
Estimates 3-4 migraines/month, treated with OTC Advil Migraine. Increase water. Continue with Neurology as directed.

## 2017-02-24 ENCOUNTER — Telehealth: Payer: Self-pay

## 2017-02-24 NOTE — Telephone Encounter (Signed)
Pt states she was given Lo Loestrin to try & it seems like her headaches are getting worse. She wishes to be switched back to her old birth control. [phone]

## 2017-02-25 NOTE — Telephone Encounter (Signed)
Notified pt AMS out of office til Monday. She has 2 wks left of pills. Ok to wait til Monday for new rx to be send. She was previously on 35 mcg pill (Nortrel). [facility] [place]

## 2017-02-28 ENCOUNTER — Other Ambulatory Visit: Payer: Self-pay | Admitting: Obstetrics and Gynecology

## 2017-02-28 MED ORDER — NORETHINDRONE-ETH ESTRADIOL 1-35 MG-MCG PO TABS: 1.0000 | ORAL_TABLET | Freq: Every day | ORAL | 11 refills | Status: DC

## 2017-02-28 NOTE — Progress Notes (Signed)
Wants to go back on pill, discussed that I'd expect headaches to worsen but patient is aware of risk and would like to trial as she had not headaches on that particular birth control

## 2017-03-14 NOTE — Progress Notes (Deleted)
Subjective:    Patient ID: Lisa Logan, female    DOB: 1990/09/27, 26 y.o.   MRN: [id number]  HPI:  02/15/17 OV: Lisa Logan presents to establish as a new pt.  She is a very pleasant 26 year old female.  PMH: Anxiety, Migraines, Obesity, and lactose intolerance.  She estimates to have 3-4 migraines/day, treated with OTC Advil Migraine.  She is followed by Neurology and they have not recommended triptans or any other meds to treat HAs. She has suffered from anxiety since highshool and has been on SSRIs and benzodiazepines in the past-tolerated well. She denies tobacco/execessive ETOH use.  She estimates to drink 20 ox water a day and doesn't follow a heart healthy diet.  She is married and they recently purchased a home and she is taking a sabbatical from work. Of Note:  Her husband has suffered from severe anxiety/depression with suicide attempts in the past.   03/15/17 OV: Lisa Logan is here for f/u: anxiety.  She has been taking Sertraline 50mg   Patient Care Team    Relationship Specialty Notifications Start End  William Hamburgeranford, Alesa Echevarria D, NP PCP - General Family Medicine  01/26/17   York SpanielWillis, [name], MD Consulting Physician Neurology  02/15/17   Conard NovakJackson, Stephen D, MD Attending Physician Obstetrics and Gynecology  02/15/17     Patient Active Problem List   Diagnosis Date Noted  . Morbid obesity (HCC) 02/15/2017  . Healthcare maintenance 02/15/2017  . Migraine 07/14/2016  . Abdominal cramping 07/14/2016  . Anxiety 07/14/2016     Past Medical History:  Diagnosis Date  . Arm fracture   . C. difficile colitis   . Depression   . HPV in female   . Migraine   . Obesity   . Vitamin D deficiency      Past Surgical History:  Procedure Laterality Date  . NO PAST SURGERIES       Family History  Problem Relation Age of Onset  . Arthritis Mother   . Mental illness Mother   . Cancer Mother        skin   . Migraines Mother   . Depression Mother   . Alcohol abuse Father   .  Hypertension Father   . Mental illness Father   . Depression Father   . Cancer Maternal Aunt        Bone  . Depression Maternal Aunt   . Arthritis Maternal Grandmother   . Stroke Maternal Grandmother   . Mental illness Maternal Grandmother   . Skin cancer Maternal Grandmother   . Cancer Maternal Grandmother        skin  . Cancer Maternal Grandfather        lung  . Mental illness Maternal Grandfather   . Cancer Paternal Grandmother        Skin  . Depression Sister   . Depression Brother   . Cancer Maternal Uncle        skin  . Depression Maternal Uncle      Social History   Substance and Sexual Activity  Drug Use No     Social History   Substance and Sexual Activity  Alcohol Use Yes   Comment: occassional     Social History   Tobacco Use  Smoking Status Never Smoker  Smokeless Tobacco Never Used     Outpatient Encounter Medications as of 03/15/2017  Medication Sig Note  . augmented betamethasone dipropionate (DIPROLENE-AF) 0.05 % ointment Apply topically 2 (two) times daily.   .Marland Kitchen  Cholecalciferol (VITAMIN D3) 10000 units TABS Take 2 Cans daily by mouth.   Griffith Citron. CYCLAFEM 1/35 tablet Take 1 tablet by mouth daily.  08/07/2014: ....  . dicyclomine (BENTYL) 10 MG capsule Take 1 capsule (10 mg total) by mouth 2 (two) times daily as needed for spasms.   Marland Kitchen. LORazepam (ATIVAN) 1 MG tablet 1 tablet by mouth every 8 hrs as needed for acute anxiety   . norethindrone-ethinyl estradiol 1/35 (ORTHO-NOVUM 1/35, 28,) tablet Take 1 tablet by mouth daily.   . sertraline (ZOLOFT) 50 MG tablet Take 1 tablet (50 mg total) daily by mouth.    No facility-administered encounter medications on file as of 03/15/2017.     Allergies: Culturelle [lactobacillus] and Penicillins  There is no height or weight on file to calculate BMI.  There were no vitals taken for this visit.     Review of Systems  Constitutional: Positive for fatigue. Negative for activity change, appetite change,  chills, diaphoresis, fever and unexpected weight change.  Eyes: Negative for visual disturbance.  Respiratory: Negative for cough, chest tightness, shortness of breath, wheezing and stridor.   Cardiovascular: Negative for chest pain, palpitations and leg swelling.  Gastrointestinal: Negative for abdominal distention, abdominal pain, blood in stool, constipation, diarrhea and rectal pain.  Endocrine: Negative for cold intolerance, heat intolerance, polydipsia, polyphagia and polyuria.  Genitourinary: Negative for difficulty urinating, flank pain and hematuria.  Musculoskeletal: Negative for arthralgias, back pain, gait problem, joint swelling, myalgias, neck pain and neck stiffness.  Skin: Negative for color change, pallor, rash and wound.  Neurological: Positive for headaches. Negative for dizziness, tremors and weakness.  Hematological: Does not bruise/bleed easily.  Psychiatric/Behavioral: Positive for sleep disturbance. Negative for decreased concentration, dysphoric mood, self-injury and suicidal ideas. The patient is nervous/anxious. The patient is not hyperactive.        Objective:   Physical Exam  Constitutional: She is oriented to person, place, and time. She appears well-developed and well-nourished. No distress.  Cardiovascular: Normal rate, regular rhythm, normal heart sounds and intact distal pulses.  No murmur heard. Pulmonary/Chest: Effort normal and breath sounds normal. No respiratory distress. She has no wheezes. She has no rales. She exhibits no tenderness.  Neurological: She is alert and oriented to person, place, and time.  Skin: Skin is warm and dry. No rash noted. She is not diaphoretic. No erythema. No pallor.  Psychiatric: She has a normal mood and affect. Her behavior is normal. Judgment and thought content normal.  Nursing note and vitals reviewed.         Assessment & Plan:   No diagnosis found.  No problem-specific Assessment & Plan notes found for this  encounter.    FOLLOW-UP:  No Follow-up on file.

## 2017-03-15 ENCOUNTER — Ambulatory Visit: Payer: 59 | Admitting: Adult Health

## 2017-03-29 HISTORY — PX: LEEP: SHX91

## 2017-03-30 ENCOUNTER — Encounter: Payer: Self-pay | Admitting: Adult Health

## 2017-03-30 ENCOUNTER — Ambulatory Visit: Payer: 59 | Admitting: Adult Health

## 2017-03-30 ENCOUNTER — Other Ambulatory Visit: Payer: Self-pay | Admitting: Adult Health

## 2017-03-30 VITALS — BP 104/75 | HR 92 | Ht 62.0 in | Wt 301.1 lb

## 2017-03-30 DIAGNOSIS — F329 Major depressive disorder, single episode, unspecified: Secondary | ICD-10-CM

## 2017-03-30 DIAGNOSIS — Z0000 Encounter for general adult medical examination without abnormal findings: Secondary | ICD-10-CM | POA: Diagnosis not present

## 2017-03-30 DIAGNOSIS — F419 Anxiety disorder, unspecified: Secondary | ICD-10-CM

## 2017-03-30 DIAGNOSIS — F32A Depression, unspecified: Secondary | ICD-10-CM

## 2017-03-30 MED ORDER — LORAZEPAM 1 MG PO TABS: ORAL_TABLET | ORAL | 0 refills | Status: DC

## 2017-03-30 MED ORDER — VENLAFAXINE HCL 75 MG PO TABS: ORAL_TABLET | ORAL | 0 refills | Status: DC

## 2017-03-30 NOTE — Progress Notes (Signed)
Subjective:    Patient ID: Lisa Logan, female    DOB: 09-25-1990, 27 y.o.   MRN: [id number]  HPI: 02/15/17 OV: Lisa Logan presents to establish as a new pt.  She is a very pleasant 27 year old female.  PMH: Anxiety, Migraines, Obesity, and lactose intolerance.  She estimates to have 3-4 migraines/day, treated with OTC Advil Migraine.  She is followed by Neurology and they have not recommended triptans or any other meds to treat HAs. She has suffered from anxiety since highshool and has been on SSRIs and benzodiazepines in the past-tolerated well. She denies tobacco/execessive ETOH use.  She estimates to drink 20 ox water a day and doesn't follow a heart healthy diet.  She is married and they recently purchased a home and she is taking a sabbatical from work. Of Note:  Her husband has suffered from severe anxiety/depression with suicide attempts in the past.   03/30/17 OV: Lisa Logan is here for f/u: anxiety/depression. She took entire rx of Sertraline, however missed f/u appt and ran out of meds, last dose >2 weeks ago.  She reports that the "medication really didn't help".  She has been on Fluoxetine in the past and reports it was too sedating.  She denies thoughts of harming herself/others, however reports sig amount of stress/anxiety r/t home/family- Her husband works Teacher, English as a foreign languageT at [employer], he suffers from severe anxiety/depression, however no recent suicidal thoughts/attempts.  Lisa Logan runs household and all family financial matters and cannot discuss with her husband due to increasing his mental health sx's. Her 26 yr old sister with have c-section to deliver her 3rd child in a few weeks and Lisa Logan will move in her to help care for her and her nieces. She recently started swimming program-GREAT! She is trying to eat more regular, heart heathy meals and continues to abstain from tobacco/excssive tobacco use. She remains on sabbatical from work and has not immediate plans to resume work outside of  the house. Patient Care Team    Relationship Specialty Notifications Start End  Lisa Logan, Lisa Frith D, NP PCP - General Family Medicine  01/26/17   York SpanielWillis, Lisa K, MD Consulting Physician Neurology  02/15/17   Lisa NovakJackson, Lisa D, MD Attending Physician Obstetrics and Gynecology  02/15/17     Patient Active Problem List   Diagnosis Date Noted  . Depression 03/30/2017  . Morbid obesity (HCC) 02/15/2017  . Healthcare maintenance 02/15/2017  . Migraine 07/14/2016  . Abdominal cramping 07/14/2016  . Anxiety 07/14/2016     Past Medical History:  Diagnosis Date  . Arm fracture   . C. difficile colitis   . Depression   . HPV in female   . Migraine   . Obesity   . Vitamin Logan deficiency      Past Surgical History:  Procedure Laterality Date  . NO PAST SURGERIES       Family History  Problem Relation Age of Onset  . Arthritis Mother   . Mental illness Mother   . Cancer Mother        skin   . Migraines Mother   . Depression Mother   . Alcohol abuse Father   . Hypertension Father   . Mental illness Father   . Depression Father   . Cancer Maternal Aunt        Bone  . Depression Maternal Aunt   . Arthritis Maternal Grandmother   . Stroke Maternal Grandmother   . Mental illness Maternal Grandmother   .  Skin cancer Maternal Grandmother   . Cancer Maternal Grandmother        skin  . Cancer Maternal Grandfather        lung  . Mental illness Maternal Grandfather   . Cancer Paternal Grandmother        Skin  . Depression Sister   . Depression Brother   . Cancer Maternal Uncle        skin  . Depression Maternal Uncle      Social History   Substance and Sexual Activity  Drug Use No     Social History   Substance and Sexual Activity  Alcohol Use Yes   Comment: occassional     Social History   Tobacco Use  Smoking Status Never Smoker  Smokeless Tobacco Never Used     Outpatient Encounter Medications as of 03/30/2017  Medication Sig Note  . augmented  betamethasone dipropionate (DIPROLENE-AF) 0.05 % ointment Apply topically 2 (two) times daily.   . Cholecalciferol (VITAMIN D3) 10000 units TABS Take 2 capsules by mouth daily.    Marland Kitchen dicyclomine (BENTYL) 10 MG capsule Take 1 capsule (10 mg total) by mouth 2 (two) times daily as needed for spasms.   Marland Kitchen LORazepam (ATIVAN) 1 MG tablet 1 tablet by mouth every 8 hrs as needed for acute anxiety   . norethindrone-ethinyl estradiol 1/35 (ORTHO-NOVUM 1/35, 28,) tablet Take 1 tablet by mouth daily.   . [DISCONTINUED] LORazepam (ATIVAN) 1 MG tablet 1 tablet by mouth every 8 hrs as needed for acute anxiety   . venlafaxine (EFFEXOR) 75 MG tablet 1 tablet by mouth daily for first two weeks, then increase to one tablet by mouth every 12 hrs.   . [DISCONTINUED] CYCLAFEM 1/35 tablet Take 1 tablet by mouth daily.  08/07/2014: ....  . [DISCONTINUED] sertraline (ZOLOFT) 50 MG tablet Take 1 tablet (50 mg total) daily by mouth.    No facility-administered encounter medications on file as of 03/30/2017.     Allergies: Culturelle [lactobacillus] and Penicillins  Body mass index is 55.07 kg/m.  Blood pressure 104/75, pulse 92, height 5\' 2"  (1.575 m), weight (!) 301 lb 1.6 oz (136.6 kg).     Review of Systems  Constitutional: Positive for fatigue. Negative for activity change, appetite change, chills, diaphoresis, fever and unexpected weight change.  Eyes: Negative for visual disturbance.  Respiratory: Negative for cough, chest tightness, shortness of breath, wheezing and stridor.   Cardiovascular: Negative for chest pain, palpitations and leg swelling.  Gastrointestinal: Negative for abdominal distention, abdominal pain, blood in stool, constipation, diarrhea and rectal pain.  Endocrine: Negative for cold intolerance, heat intolerance, polydipsia, polyphagia and polyuria.  Genitourinary: Negative for difficulty urinating, flank pain and hematuria.  Musculoskeletal: Negative for arthralgias, back pain, gait  problem, joint swelling, myalgias, neck pain and neck stiffness.  Skin: Negative for color change, pallor, rash and wound.  Neurological: Positive for headaches. Negative for dizziness, tremors and weakness.  Hematological: Does not bruise/bleed easily.  Psychiatric/Behavioral: Positive for sleep disturbance. Negative for decreased concentration, dysphoric mood, self-injury and suicidal ideas. The patient is nervous/anxious. The patient is not hyperactive.        Objective:   Physical Exam  Constitutional: She is oriented to person, place, and time. She appears well-developed and well-nourished. No distress.  HENT:  Head: Normocephalic and atraumatic.  Right Ear: External ear normal.  Left Ear: External ear normal.  Eyes: Conjunctivae are normal. Pupils are equal, round, and reactive to light.  Cardiovascular: Normal rate, regular rhythm,  normal heart sounds and intact distal pulses.  No murmur heard. Pulmonary/Chest: Effort normal and breath sounds normal. No respiratory distress. She has no wheezes. She has no rales. She exhibits no tenderness.  Neurological: She is alert and oriented to person, place, and time.  Skin: Skin is warm and dry. No rash noted. She is not diaphoretic. No erythema. No pallor.  Psychiatric: She has a normal mood and affect. Her behavior is normal. Judgment and thought content normal.  Nursing note and vitals reviewed.         Assessment & Plan:   1. Healthcare maintenance   2. Morbid obesity (HCC)   3. Anxiety   4. Depression, unspecified depression type     Healthcare maintenance Increase swimming program- GREAT! Follow heart healthy diet and increase water intake, strive for at least 150 oz/day. Please re-schedule your therapy appt ASAP. Follow-up in 6 months for complete physical with fasting labs.  Anxiety [facility] Controlled Substance Database reviewed- no contraindications noted. Discussed that Lorazepam should not be long tx for  acute anxiety. Remain off Sertraline and start Venlafaxine 75mg - once daily for 2 weeks then increase to twice daily.   Use Lorazepam 1mg  sparingly.  Contact clinic via MyChart Message in 4 weeks to evaluate medication effectiveness.  Depression Remain off Sertraline and start Venlafaxine 75mg - once daily for 2 weeks then increase to twice daily.   Use Lorazepam 1mg  sparingly.  Contact clinic via MyChart Message in 4 weeks to evaluate medication effectiveness. Increase regular swimming and follow heart healthy diet.    FOLLOW-UP:  Return in about 6 months (around 09/27/2017) for CPE, Fasting Labs.

## 2017-03-30 NOTE — Patient Instructions (Signed)
Generalized Anxiety Disorder, Adult Generalized anxiety disorder (GAD) is a mental health disorder. People with this condition constantly worry about everyday events. Unlike normal anxiety, worry related to GAD is not triggered by a specific event. These worries also do not fade or get better with time. GAD interferes with life functions, including relationships, work, and school. GAD can vary from mild to severe. People with severe GAD can have intense waves of anxiety with physical symptoms (panic attacks). What are the causes? The exact cause of GAD is not known. What increases the risk? This condition is more likely to develop in:  Women.  People who have a family history of anxiety disorders.  People who are very shy.  People who experience very stressful life events, such as the death of a loved one.  People who have a very stressful family environment.  What are the signs or symptoms? People with GAD often worry excessively about many things in their lives, such as their health and family. They may also be overly concerned about:  Doing well at work.  Being on time.  Natural disasters.  Friendships.  Physical symptoms of GAD include:  Fatigue.  Muscle tension or having muscle twitches.  Trembling or feeling shaky.  Being easily startled.  Feeling like your heart is pounding or racing.  Feeling out of breath or like you cannot take a deep breath.  Having trouble falling asleep or staying asleep.  Sweating.  Nausea, diarrhea, or irritable bowel syndrome (IBS).  Headaches.  Trouble concentrating or remembering facts.  Restlessness.  Irritability.  How is this diagnosed? Your health care provider can diagnose GAD based on your symptoms and medical history. You will also have a physical exam. The health care provider will ask specific questions about your symptoms, including how severe they are, when they started, and if they come and go. Your health care  provider may ask you about your use of alcohol or drugs, including prescription medicines. Your health care provider may refer you to a mental health specialist for further evaluation. Your health care provider will do a thorough examination and may perform additional tests to rule out other possible causes of your symptoms. To be diagnosed with GAD, a person must have anxiety that:  Is out of his or her control.  Affects several different aspects of his or her life, such as work and relationships.  Causes distress that makes him or her unable to take part in normal activities.  Includes at least three physical symptoms of GAD, such as restlessness, fatigue, trouble concentrating, irritability, muscle tension, or sleep problems.  Before your health care provider can confirm a diagnosis of GAD, these symptoms must be present more days than they are not, and they must last for six months or longer. How is this treated? The following therapies are usually used to treat GAD:  Medicine. Antidepressant medicine is usually prescribed for long-term daily control. Antianxiety medicines may be added in severe cases, especially when panic attacks occur.  Talk therapy (psychotherapy). Certain types of talk therapy can be helpful in treating GAD by providing support, education, and guidance. Options include: ? Cognitive behavioral therapy (CBT). People learn coping skills and techniques to ease their anxiety. They learn to identify unrealistic or negative thoughts and behaviors and to replace them with positive ones. ? Acceptance and commitment therapy (ACT). This treatment teaches people how to be mindful as a way to cope with unwanted thoughts and feelings. ? Biofeedback. This process trains you to   manage your body's response (physiological response) through breathing techniques and relaxation methods. You will work with a therapist while machines are used to monitor your physical symptoms.  Stress  management techniques. These include yoga, meditation, and exercise.  A mental health specialist can help determine which treatment is best for you. Some people see improvement with one type of therapy. However, other people require a combination of therapies. Follow these instructions at home:  Take over-the-counter and prescription medicines only as told by your health care provider.  Try to maintain a normal routine.  Try to anticipate stressful situations and allow extra time to manage them.  Practice any stress management or self-calming techniques as taught by your health care provider.  Do not punish yourself for setbacks or for not making progress.  Try to recognize your accomplishments, even if they are small.  Keep all follow-up visits as told by your health care provider. This is important. Contact a health care provider if:  Your symptoms do not get better.  Your symptoms get worse.  You have signs of depression, such as: ? A persistently sad, cranky, or irritable mood. ? Loss of enjoyment in activities that used to bring you joy. ? Change in weight or eating. ? Changes in sleeping habits. ? Avoiding friends or family members. ? Loss of energy for normal tasks. ? Feelings of guilt or worthlessness. Get help right away if:  You have serious thoughts about hurting yourself or others. If you ever feel like you may hurt yourself or others, or have thoughts about taking your own life, get help right away. You can go to your nearest emergency department or call:  Your local emergency services (911 in the [place]  A suicide crisis helpline, such as the National Suicide Prevention Lifeline at (612) 382-9601. This is open 24 hours a day.  Summary  Generalized anxiety disorder (GAD) is a mental health disorder that involves worry that is not triggered by a specific event.  People with GAD often worry excessively about many things in their lives, such as their health and  family.  GAD may cause physical symptoms such as restlessness, trouble concentrating, sleep problems, frequent sweating, nausea, diarrhea, headaches, and trembling or muscle twitching.  A mental health specialist can help determine which treatment is best for you. Some people see improvement with one type of therapy. However, other people require a combination of therapies. This information is not intended to replace advice given to you by your health care provider. Make sure you discuss any questions you have with your health care provider. Document Released: 07/10/2012 Document Revised: 02/03/2016 Document Reviewed: 02/03/2016 Elsevier Interactive Patient Education  2018 [employer].   Major Depressive Disorder, Adult Major depressive disorder (MDD) is a mental health condition. MDD often makes you feel sad, hopeless, or helpless. MDD can also cause symptoms in your body. MDD can affect your:  Work.  School.  Relationships.  Other normal activities.  MDD can range from mild to very bad. It may occur once (single episode MDD). It can also occur many times (recurrent MDD). The main symptoms of MDD often include:  Feeling sad, depressed, or irritable most of the time.  Loss of interest.  MDD symptoms also include:  Sleeping too much or too little.  Eating too much or too little.  A change in your weight.  Feeling tired (fatigue) or having low energy.  Feeling worthless.  Feeling guilty.  Trouble making decisions.  Trouble thinking clearly.  Thoughts of suicide  or harming others.  Feeling weak.  Feeling agitated.  Keeping yourself from being around other people (isolation).  Follow these instructions at home: Activity  Do these things as told by your doctor: ? Go back to your normal activities. ? Exercise regularly. ? Spend time outdoors. Alcohol  Talk with your doctor about how alcohol can affect your antidepressant medicines.  Do not drink alcohol.  Or, limit how much alcohol you drink. ? This means no more than 1 drink a day for nonpregnant women and 2 drinks a day for men. One drink equals one of these:  12 oz of beer.  5 oz of wine.  1 oz of hard liquor. General instructions  Take over-the-counter and prescription medicines only as told by your doctor.  Eat a healthy diet.  Get plenty of sleep.  Find activities that you enjoy. Make time to do them.  Think about joining a support group. Your doctor may be able to suggest a group for you.  Keep all follow-up visits as told by your doctor. This is important. Where to find more information:  The [employer] on Mental Illness: ? www.nami.org  [employer] of Mental Health: ? http://www.maynard.net/www.nimh.nih.gov  National Suicide Prevention Lifeline: ? 30816696941-415 884 5904. This is free, 24-hour help. Contact a doctor if:  Your symptoms get worse.  You have new symptoms. Get help right away if:  You self-harm.  You see, hear, taste, smell, or feel things that are not present (hallucinate). If you ever feel like you may hurt yourself or others, or have thoughts about taking your own life, get help right away. You can go to your nearest emergency department or call:  Your local emergency services (911 in the [place]  A suicide crisis helpline, such as the National Suicide Prevention Lifeline: ? 608-097-51821-415 884 5904. This is open 24 hours a day.  This information is not intended to replace advice given to you by your health care provider. Make sure you discuss any questions you have with your health care provider. Document Released: 02/24/2015 Document Revised: 11/30/2015 Document Reviewed: 11/30/2015 Elsevier Interactive Patient Education  2017 [employer].  Remain off Sertraline and start Venlafaxine 75mg - once daily for 2 weeks then increase to twice daily.   Use Lorazepam 1mg  sparingly.  Contact clinic via MyChart Message in 4 weeks to evaluate medication  effectiveness. Increase swimming program- GREAT! Follow heart healthy diet and increase water intake, strive for at least 150 oz/day. Please re-schedule your therapy appt ASAP. Follow-up in 6 months for complete physical with fasting labs. NICE TO SEE YOU!

## 2017-03-30 NOTE — Assessment & Plan Note (Signed)
Remain off Sertraline and start Venlafaxine 75mg - once daily for 2 weeks then increase to twice daily.   Use Lorazepam 1mg  sparingly.  Contact clinic via MyChart Message in 4 weeks to evaluate medication effectiveness. Increase regular swimming and follow heart healthy diet.

## 2017-03-30 NOTE — Assessment & Plan Note (Signed)
Increase swimming program- GREAT! Follow heart healthy diet and increase water intake, strive for at least 150 oz/day. Please re-schedule your therapy appt ASAP. Follow-up in 6 months for complete physical with fasting labs.

## 2017-03-30 NOTE — Assessment & Plan Note (Signed)
[place] Controlled Substance Database reviewed- no contraindications noted. Discussed that Lorazepam should not be long tx for acute anxiety. Remain off Sertraline and start Venlafaxine 75mg - once daily for 2 weeks then increase to twice daily.   Use Lorazepam 1mg  sparingly.  Contact clinic via MyChart Message in 4 weeks to evaluate medication effectiveness.

## 2017-04-06 ENCOUNTER — Other Ambulatory Visit: Payer: Self-pay

## 2017-04-06 ENCOUNTER — Emergency Department (HOSPITAL_COMMUNITY)
Admission: EM | Admit: 2017-04-06 | Discharge: 2017-04-07 | Disposition: A | Discharge status: Home / Self Care | Payer: 59 | Attending: Emergency Medicine | Admitting: Emergency Medicine

## 2017-04-06 ENCOUNTER — Emergency Department (HOSPITAL_COMMUNITY): Payer: 59

## 2017-04-06 ENCOUNTER — Encounter (HOSPITAL_COMMUNITY): Payer: Self-pay | Admitting: Emergency Medicine

## 2017-04-06 DIAGNOSIS — Y999 Unspecified external cause status: Secondary | ICD-10-CM | POA: Diagnosis not present

## 2017-04-06 DIAGNOSIS — Y92410 Unspecified street and highway as the place of occurrence of the external cause: Secondary | ICD-10-CM | POA: Insufficient documentation

## 2017-04-06 DIAGNOSIS — S6991XA Unspecified injury of right wrist, hand and finger(s), initial encounter: Secondary | ICD-10-CM | POA: Diagnosis not present

## 2017-04-06 DIAGNOSIS — Y9389 Activity, other specified: Secondary | ICD-10-CM | POA: Diagnosis not present

## 2017-04-06 DIAGNOSIS — M7989 Other specified soft tissue disorders: Secondary | ICD-10-CM | POA: Diagnosis not present

## 2017-04-06 DIAGNOSIS — S3991XA Unspecified injury of abdomen, initial encounter: Secondary | ICD-10-CM | POA: Diagnosis not present

## 2017-04-06 DIAGNOSIS — M542 Cervicalgia: Secondary | ICD-10-CM | POA: Diagnosis not present

## 2017-04-06 DIAGNOSIS — R519 Headache, unspecified: Secondary | ICD-10-CM

## 2017-04-06 DIAGNOSIS — R1011 Right upper quadrant pain: Secondary | ICD-10-CM | POA: Insufficient documentation

## 2017-04-06 DIAGNOSIS — M79641 Pain in right hand: Secondary | ICD-10-CM | POA: Diagnosis not present

## 2017-04-06 DIAGNOSIS — G43909 Migraine, unspecified, not intractable, without status migrainosus: Secondary | ICD-10-CM | POA: Diagnosis not present

## 2017-04-06 DIAGNOSIS — S299XXA Unspecified injury of thorax, initial encounter: Secondary | ICD-10-CM | POA: Diagnosis not present

## 2017-04-06 DIAGNOSIS — S0990XA Unspecified injury of head, initial encounter: Secondary | ICD-10-CM | POA: Diagnosis not present

## 2017-04-06 DIAGNOSIS — R0789 Other chest pain: Secondary | ICD-10-CM | POA: Insufficient documentation

## 2017-04-06 DIAGNOSIS — Z79899 Other long term (current) drug therapy: Secondary | ICD-10-CM | POA: Insufficient documentation

## 2017-04-06 DIAGNOSIS — R51 Headache: Secondary | ICD-10-CM | POA: Insufficient documentation

## 2017-04-06 DIAGNOSIS — S199XXA Unspecified injury of neck, initial encounter: Secondary | ICD-10-CM | POA: Diagnosis not present

## 2017-04-06 LAB — BASIC METABOLIC PANEL
ANION GAP: 10 (ref 5–15)
BUN: 10 mg/dL (ref 6–20)
CHLORIDE: 104 mmol/L (ref 101–111)
CO2: 21 mmol/L — AB (ref 22–32)
Calcium: 9.4 mg/dL (ref 8.9–10.3)
Creatinine, Ser: 0.7 mg/dL (ref 0.44–1.00)
GFR calc non Af Amer: >60 mL/min (ref >60)
GLUCOSE: 84 mg/dL (ref 65–99)
Potassium: 5.6 mmol/L — ABNORMAL HIGH (ref 3.5–5.1)
Sodium: 135 mmol/L (ref 135–145)

## 2017-04-06 LAB — I-STAT TROPONIN, ED: Troponin i, poc: 0 ng/mL (ref 0.00–0.08)

## 2017-04-06 LAB — CBC
HEMATOCRIT: 45 % (ref 36.0–46.0)
HEMOGLOBIN: 14.8 g/dL (ref 12.0–15.0)
MCH: 27.9 pg (ref 26.0–34.0)
MCHC: 32.9 g/dL (ref 30.0–36.0)
MCV: 84.7 fL (ref 78.0–100.0)
Platelets: 285 10*3/uL (ref 150–400)
RBC: 5.31 MIL/uL — ABNORMAL HIGH (ref 3.87–5.11)
RDW: 13.2 % (ref 11.5–15.5)
WBC: 13.8 10*3/uL — AB (ref 4.0–10.5)

## 2017-04-06 LAB — I-STAT BETA HCG BLOOD, ED (MC, WL, AP ONLY): HCG, QUANTITATIVE: 6.7 m[IU]/mL — AB (ref <5)

## 2017-04-06 MED ORDER — ONDANSETRON HCL 4 MG/2ML IJ SOLN
4.0000 mg | Freq: Once | INTRAMUSCULAR | Status: AC
  Administered 2017-04-06: 4 mg via INTRAVENOUS
  Filled 2017-04-06: qty 2

## 2017-04-06 MED ORDER — IOPAMIDOL (ISOVUE-300) INJECTION 61%
INTRAVENOUS | Status: AC
  Administered 2017-04-06: 100 mL
  Filled 2017-04-06: qty 100

## 2017-04-06 MED ORDER — IOPAMIDOL (ISOVUE-300) INJECTION 61%
INTRAVENOUS | Status: AC
  Filled 2017-04-06: qty 100
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
## 2017-04-07 ENCOUNTER — Emergency Department (HOSPITAL_COMMUNITY): Payer: 59

## 2017-04-07 DIAGNOSIS — R51 Headache: Secondary | ICD-10-CM | POA: Diagnosis not present

## 2017-04-07 DIAGNOSIS — M7989 Other specified soft tissue disorders: Secondary | ICD-10-CM | POA: Diagnosis not present

## 2017-04-07 DIAGNOSIS — S6991XA Unspecified injury of right wrist, hand and finger(s), initial encounter: Secondary | ICD-10-CM | POA: Diagnosis not present

## 2017-04-07 DIAGNOSIS — M79641 Pain in right hand: Secondary | ICD-10-CM | POA: Diagnosis not present

## 2017-04-07 MED ORDER — DEXAMETHASONE SODIUM PHOSPHATE 10 MG/ML IJ SOLN
10.0000 mg | Freq: Once | INTRAMUSCULAR | Status: AC
  Administered 2017-04-07: 10 mg via INTRAVENOUS
  Filled 2017-04-07: qty 1

## 2017-04-07 MED ORDER — NAPROXEN 500 MG PO TABS: 500.0000 mg | ORAL_TABLET | Freq: Two times a day (BID) | ORAL | 0 refills | Status: DC

## 2017-04-07 MED ORDER — DIPHENHYDRAMINE HCL 50 MG/ML IJ SOLN
25.0000 mg | Freq: Once | INTRAMUSCULAR | Status: AC
  Administered 2017-04-07: 25 mg via INTRAVENOUS
  Filled 2017-04-07: qty 1

## 2017-04-07 MED ORDER — KETOROLAC TROMETHAMINE 15 MG/ML IJ SOLN
15.0000 mg | Freq: Once | INTRAMUSCULAR | Status: AC
  Administered 2017-04-07: 15 mg via INTRAVENOUS
  Filled 2017-04-07: qty 1

## 2017-04-07 MED ORDER — CYCLOBENZAPRINE HCL 5 MG PO TABS: 5.0000 mg | ORAL_TABLET | Freq: Every evening | ORAL | 0 refills | Status: DC | PRN

## 2017-04-07 MED ORDER — PROCHLORPERAZINE EDISYLATE 5 MG/ML IJ SOLN
10.0000 mg | Freq: Once | INTRAMUSCULAR | Status: AC
Start: 2017-04-07 — End: 2017-04-07
  Administered 2017-04-07: 10 mg via INTRAVENOUS
  Filled 2017-04-07: qty 2

## 2017-04-07 NOTE — Discharge Instructions (Signed)
Take naproxen 2 times a day with meals.  Do not take other anti-inflammatories at the same time open (Advil, Motrin, ibuprofen, Aleve). You may supplement with Tylenol if you need further pain control. Use Flexeril as needed for muscle stiffness or soreness.  Have caution, as this may make you tired or groggy.  Do not drive or operate heavy machinery while taking this medicine. Use ice packs or heating pads if this helps control your pain. You likely have continued muscle stiffness and soreness over the next couple days.  Follow-up with primary care in 1 week if your symptoms are not improving. Return to the emergency room if you develop vision changes, vomiting, slurred speech, numbness, loss of bowel or bladder control, or any new or worsening symptoms.
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
## 2017-04-08 ENCOUNTER — Telehealth: Payer: Self-pay | Admitting: Adult Health

## 2017-04-11 ENCOUNTER — Encounter: Payer: Self-pay | Admitting: Adult Health

## 2017-04-11 ENCOUNTER — Ambulatory Visit (INDEPENDENT_AMBULATORY_CARE_PROVIDER_SITE_OTHER): Payer: 59 | Admitting: Adult Health

## 2017-04-11 VITALS — BP 105/75 | HR 100 | Ht 62.0 in | Wt 301.1 lb

## 2017-04-11 DIAGNOSIS — Z09 Encounter for follow-up examination after completed treatment for conditions other than malignant neoplasm: Secondary | ICD-10-CM | POA: Diagnosis not present

## 2017-04-11 DIAGNOSIS — S63501D Unspecified sprain of right wrist, subsequent encounter: Secondary | ICD-10-CM | POA: Diagnosis not present

## 2017-04-11 DIAGNOSIS — N912 Amenorrhea, unspecified: Secondary | ICD-10-CM | POA: Diagnosis not present

## 2017-04-11 LAB — POCT URINE PREGNANCY: Preg Test, Ur: NEGATIVE

## 2017-04-11 MED ORDER — ONDANSETRON 8 MG PO TBDP: 8.0000 mg | ORAL_TABLET | Freq: Three times a day (TID) | ORAL | 1 refills | Status: DC | PRN
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
## 2017-04-11 NOTE — Assessment & Plan Note (Signed)
Pregnancy test- negative. Continue Ortho/Novum consistently daily.

## 2017-04-11 NOTE — Addendum Note (Signed)
Addended by: Stan HeadNELSON, TONYA S on: 04/11/2017 11:02 AM   Modules accepted: Orders

## 2017-04-11 NOTE — Patient Instructions (Signed)
Contusion A contusion is a deep bruise. Contusions are the result of a blunt injury to tissues and muscle fibers under the skin. The injury causes bleeding under the skin. The skin overlying the contusion may turn blue, purple, or yellow. Minor injuries will give you a painless contusion, but more severe contusions may stay painful and swollen for a few weeks. What are the causes? This condition is usually caused by a blow, trauma, or direct force to an area of the body. What are the signs or symptoms? Symptoms of this condition include:  Swelling of the injured area.  Pain and tenderness in the injured area.  Discoloration. The area may have redness and then turn blue, purple, or yellow.  How is this diagnosed? This condition is diagnosed based on a physical exam and medical history. An X-ray, CT scan, or MRI may be needed to determine if there are any associated injuries, such as broken bones (fractures). How is this treated? Specific treatment for this condition depends on what area of the body was injured. In general, the best treatment for a contusion is resting, icing, applying pressure to (compression), and elevating the injured area. This is often called the RICE strategy. Over-the-counter anti-inflammatory medicines may also be recommended for pain control. Follow these instructions at home:  Rest the injured area.  If directed, apply ice to the injured area: ? Put ice in a plastic bag. ? Place a towel between your skin and the bag. ? Leave the ice on for 20 minutes, 2-3 times per day.  If directed, apply light compression to the injured area using an elastic bandage. Make sure the bandage is not wrapped too tightly. Remove and reapply the bandage as directed by your health care provider.  If possible, raise (elevate) the injured area above the level of your heart while you are sitting or lying down.  Take over-the-counter and prescription medicines only as told by your health  care provider. Contact a health care provider if:  Your symptoms do not improve after several days of treatment.  Your symptoms get worse.  You have difficulty moving the injured area. Get help right away if:  You have severe pain.  You have numbness in a hand or foot.  Your hand or foot turns pale or cold. This information is not intended to replace advice given to you by your health care provider. Make sure you discuss any questions you have with your health care provider. Document Released: 12/23/2004 Document Revised: 07/24/2015 Document Reviewed: 07/31/2014 Elsevier Interactive Patient Education  [employer]2018 [employer]   If symptoms persist >1 month, please call clinic. Apply ice to areas that are swollen/sore for 20 mins several times a day. Use ace wrap as needed. Use zofran as needed. Pregnancy test- negative. FEEL BETTER!

## 2017-04-11 NOTE — Assessment & Plan Note (Signed)
If symptoms persist >1 month, please call clinic. Apply ice to areas that are swollen/sore for 20 mins several times a day. Use ace wrap as needed. Use zofran as needed.

## 2017-04-12 ENCOUNTER — Ambulatory Visit (HOSPITAL_COMMUNITY): Payer: 59 | Admitting: Psychiatry

## 2017-04-26 ENCOUNTER — Encounter: Payer: Self-pay | Admitting: Adult Health

## 2017-04-27 ENCOUNTER — Other Ambulatory Visit: Payer: Self-pay | Admitting: Adult Health

## 2017-04-27 MED ORDER — VENLAFAXINE HCL 75 MG PO TABS: ORAL_TABLET | ORAL | 2 refills | Status: DC

## 2017-05-31 ENCOUNTER — Encounter: Payer: Self-pay | Admitting: Adult Health

## 2017-06-14 ENCOUNTER — Ambulatory Visit: Payer: Self-pay | Admitting: Adult Health

## 2017-06-22 NOTE — Progress Notes (Signed)
Subjective:    Patient ID: Lisa Logan, female    DOB: [birth year], 27 y.o.   MRN: [id number]  HPI;  Lisa Logan presents with nausea assoc with Venlafaxine .  She was started on beginning of Jan and titrated up to  BID. She reports nausea developed after dose around late feb/early march.  She reports vomiting twice after medication.  She stopped Venlafaxine, last dose > 2 weeks ago. She continues to struggle with anxiety and stress r/t to the following: 1) She is not working and finances are strained 2) Her husband's sig hx of depression 3) Her husband's addiction to porn, he can spend >3 hrs/day on porn internet sites 4) Lack of sexual intimacy with husband, she estimates they have intercourse once every few months 5) Her husband wants to start a family in the next few months 6) She is unable to share what is really going on in her marriage with friends/family She has been swimming once week and plans on starting a regular hiking regime [place]. She continues to avoid tobacco/ETOH She has been trying to follow heart healthy diet.  She denies thoughts of harming herself/others. She denies CP/dyspnea/palpitations. She been using Lorazepam  1-2 tabs at least once week to handle flare ups of acute stress  Patient Care Team    Relationship Specialty Notifications Start End  William Hamburger D, NP PCP - General Family Medicine  01/26/17   York Spaniel, MD Consulting Physician Neurology  02/15/17   Conard Novak, MD Attending Physician Obstetrics and Gynecology  02/15/17     Patient Active Problem List   Diagnosis Date Noted  . Amenorrhea 04/11/2017  . Encounter for examination following treatment at hospital 04/11/2017  . Depression 03/30/2017  . Morbid obesity (HCC) 02/15/2017  . Healthcare maintenance 02/15/2017  . Migraine 07/14/2016  . Abdominal cramping 07/14/2016  . Anxiety 07/14/2016     Past Medical History:  Diagnosis Date  . Arm fracture    . C. difficile colitis   . Depression   . HPV in female   . Migraine   . Obesity   . Vitamin D deficiency      Past Surgical History:  Procedure Laterality Date  . NO PAST SURGERIES       Family History  Problem Relation Age of Onset  . Arthritis Mother   . Mental illness Mother   . Cancer Mother        skin   . Migraines Mother   . Depression Mother   . Alcohol abuse Father   . Hypertension Father   . Mental illness Father   . Depression Father   . Cancer Maternal Aunt        Bone  . Depression Maternal Aunt   . Arthritis Maternal Grandmother   . Stroke Maternal Grandmother   . Mental illness Maternal Grandmother   . Skin cancer Maternal Grandmother   . Cancer Maternal Grandmother        skin  . Cancer Maternal Grandfather        lung  . Mental illness Maternal Grandfather   . Cancer Paternal Grandmother        Skin  . Depression Sister   . Depression Brother   . Cancer Maternal Uncle        skin  . Depression Maternal Uncle      Social History   Substance and Sexual Activity  Drug Use No     Social History  Substance and Sexual Activity  Alcohol Use Yes   Comment: occassional     Social History   Tobacco Use  Smoking Status Never Smoker  Smokeless Tobacco Never Used     Outpatient Encounter Medications as of 06/23/2017  Medication Sig  . cyclobenzaprine (FLEXERIL) 5 MG tablet Take 1 tablet (5 mg total) by mouth at bedtime as needed for muscle spasms.  Marland Kitchen dicyclomine (BENTYL) 10 MG capsule Take 1 capsule (10 mg total) by mouth 2 (two) times daily as needed for spasms.  Marland Kitchen LORazepam (ATIVAN) 1 MG tablet 1 tablet by mouth every 8 hrs as needed for acute anxiety  . norethindrone-ethinyl estradiol 1/35 (ORTHO-NOVUM 1/35, 28,) tablet Take 1 tablet by mouth daily.  . ondansetron (ZOFRAN-ODT) 8 MG disintegrating tablet Take 1 tablet (8 mg total) by mouth every 8 (eight) hours as needed for nausea or vomiting.  . [DISCONTINUED] LORazepam  (ATIVAN) 1 MG tablet 1 tablet by mouth every 8 hrs as needed for acute anxiety  . [DISCONTINUED] venlafaxine (EFFEXOR) 75 MG tablet 1 tab by mouth every 12 hrs  . sertraline (ZOLOFT) 25 MG tablet 1 tab each am week one.  2 tabs each am week two   No facility-administered encounter medications on file as of 06/23/2017.     Allergies: Culturelle [lactobacillus] and Penicillins  Body mass index is 54.98 kg/m.  Blood pressure 117/82, pulse 88, height  (1.575 m), weight (!) 300 lb 9.6 oz (136.4 kg), SpO2 97 %.  Review of Systems  Constitutional: Positive for fatigue. Negative for activity change, appetite change, chills, diaphoresis, fever and unexpected weight change.  Eyes: Negative for visual disturbance.  Respiratory: Negative for cough, chest tightness, shortness of breath, wheezing and stridor.   Cardiovascular: Negative for chest pain, palpitations and leg swelling.  Gastrointestinal: Negative for abdominal distention, abdominal pain, blood in stool, constipation, diarrhea, nausea and vomiting.  Genitourinary: Negative for difficulty urinating and flank pain.  Neurological: Negative for dizziness, weakness, light-headedness and headaches.  Hematological: Does not bruise/bleed easily.  Psychiatric/Behavioral: Positive for sleep disturbance. Negative for behavioral problems, confusion, decreased concentration, dysphoric mood, hallucinations, self-injury and suicidal ideas. The patient is nervous/anxious. The patient is not hyperactive.        Objective:   Physical Exam  Constitutional: She is oriented to person, place, and time. She appears well-developed and well-nourished. She appears distressed.  HENT:  Head: Normocephalic and atraumatic.  Right Ear: External ear normal.  Left Ear: External ear normal.  Eyes: Pupils are equal, round, and reactive to light. Conjunctivae are normal.  Cardiovascular: Normal rate, regular rhythm, normal heart sounds and intact distal pulses.   No murmur heard. Pulmonary/Chest: Effort normal and breath sounds normal. No respiratory distress. She has no wheezes. She has no rales. She exhibits no tenderness.  Neurological: She is alert and oriented to person, place, and time.  Skin: Skin is warm and dry. No rash noted. She is not diaphoretic. No erythema. No pallor.  Psychiatric: Her speech is normal and behavior is normal. Judgment and thought content normal. Her mood appears anxious. Cognition and memory are normal.  Very well groomed Tearful at times, although did smile and laugh during OV       Assessment & Plan:   1. Anxiety   2. Healthcare maintenance     Anxiety Please re-start with therapist! Recommend couples therapy as well. Please remain off Venlafaxine. Please start Sertraline (Zoloft) - Week one-one tab each am Week two- two tabs each am Please call  clinic in 2 weeks or send MyChart message and let us know how you are feeling- we will adjust medication as needed.  One more refill on Lorazepam provided, will need controlled substance contract before anymore refills. Increase water intake, strive for at least 100 ounces/day.   Follow Heart Healthy diet Increase regular exercise.  Recommend at least 30 minutes daily, 5 days per week of walking, jogging, biking, swimming, YouTube/Pinterest workout videos.  Healthcare maintenance CPE with fasting labs this summer  Pt was in the office today for 40+ minutes, with over 50% time spent in face to face counseling of various medical concerns and in coordination of care  FOLLOW-UP:  Return in about 3 months (around 09/23/2017) for CPE, Fasting Labs.

## 2017-06-23 ENCOUNTER — Encounter: Payer: Self-pay | Admitting: Adult Health

## 2017-06-23 ENCOUNTER — Ambulatory Visit: Payer: 59 | Admitting: Adult Health

## 2017-06-23 VITALS — BP 117/82 | HR 88 | Ht 62.0 in | Wt 300.6 lb

## 2017-06-23 DIAGNOSIS — F419 Anxiety disorder, unspecified: Secondary | ICD-10-CM | POA: Diagnosis not present

## 2017-06-23 DIAGNOSIS — Z0000 Encounter for general adult medical examination without abnormal findings: Secondary | ICD-10-CM

## 2017-06-23 MED ORDER — SERTRALINE HCL 25 MG PO TABS: ORAL_TABLET | ORAL | 0 refills | Status: DC

## 2017-06-23 MED ORDER — LORAZEPAM 1 MG PO TABS: ORAL_TABLET | ORAL | 0 refills | Status: DC

## 2017-06-23 NOTE — Assessment & Plan Note (Addendum)
Please re-start with therapist! Recommend couples therapy as well. Please remain off Venlafaxine. Please start Sertraline (Zoloft) 25mg - Week one-one tab each am Week two- two tabs each am Please call clinic in 2 weeks or send MyChart message and let us know how you are feeling- we will adjust medication as needed.  One more refill on Lorazepam provided, will need controlled substance contract before anymore refills. Increase water intake, strive for at least 100 ounces/day.   Follow Heart Healthy diet Increase regular exercise.  Recommend at least 30 minutes daily, 5 days per week of walking, jogging, biking, swimming, YouTube/Pinterest workout videos.

## 2017-06-23 NOTE — Patient Instructions (Addendum)
Generalized Anxiety Disorder, Adult Generalized anxiety disorder (GAD) is a mental health disorder. People with this condition constantly worry about everyday events. Unlike normal anxiety, worry related to GAD is not triggered by a specific event. These worries also do not fade or get better with time. GAD interferes with life functions, including relationships, work, and school. GAD can vary from mild to severe. People with severe GAD can have intense waves of anxiety with physical symptoms (panic attacks). What are the causes? The exact cause of GAD is not known. What increases the risk? This condition is more likely to develop in:  Women.  People who have a family history of anxiety disorders.  People who are very shy.  People who experience very stressful life events, such as the death of a loved one.  People who have a very stressful family environment.  What are the signs or symptoms? People with GAD often worry excessively about many things in their lives, such as their health and family. They may also be overly concerned about:  Doing well at work.  Being on time.  Natural disasters.  Friendships.  Physical symptoms of GAD include:  Fatigue.  Muscle tension or having muscle twitches.  Trembling or feeling shaky.  Being easily startled.  Feeling like your heart is pounding or racing.  Feeling out of breath or like you cannot take a deep breath.  Having trouble falling asleep or staying asleep.  Sweating.  Nausea, diarrhea, or irritable bowel syndrome (IBS).  Headaches.  Trouble concentrating or remembering facts.  Restlessness.  Irritability.  How is this diagnosed? Your health care provider can diagnose GAD based on your symptoms and medical history. You will also have a physical exam. The health care provider will ask specific questions about your symptoms, including how severe they are, when they started, and if they come and go. Your health care  provider may ask you about your use of alcohol or drugs, including prescription medicines. Your health care provider may refer you to a mental health specialist for further evaluation. Your health care provider will do a thorough examination and may perform additional tests to rule out other possible causes of your symptoms. To be diagnosed with GAD, a person must have anxiety that:  Is out of his or her control.  Affects several different aspects of his or her life, such as work and relationships.  Causes distress that makes him or her unable to take part in normal activities.  Includes at least three physical symptoms of GAD, such as restlessness, fatigue, trouble concentrating, irritability, muscle tension, or sleep problems.  Before your health care provider can confirm a diagnosis of GAD, these symptoms must be present more days than they are not, and they must last for six months or longer. How is this treated? The following therapies are usually used to treat GAD:  Medicine. Antidepressant medicine is usually prescribed for long-term daily control. Antianxiety medicines may be added in severe cases, especially when panic attacks occur.  Talk therapy (psychotherapy). Certain types of talk therapy can be helpful in treating GAD by providing support, education, and guidance. Options include: ? Cognitive behavioral therapy (CBT). People learn coping skills and techniques to ease their anxiety. They learn to identify unrealistic or negative thoughts and behaviors and to replace them with positive ones. ? Acceptance and commitment therapy (ACT). This treatment teaches people how to be mindful as a way to cope with unwanted thoughts and feelings. ? Biofeedback. This process trains you to   manage your body's response (physiological response) through breathing techniques and relaxation methods. You will work with a therapist while machines are used to monitor your physical symptoms.  Stress  management techniques. These include yoga, meditation, and exercise.  A mental health specialist can help determine which treatment is best for you. Some people see improvement with one type of therapy. However, other people require a combination of therapies. Follow these instructions at home:  Take over-the-counter and prescription medicines only as told by your health care provider.  Try to maintain a normal routine.  Try to anticipate stressful situations and allow extra time to manage them.  Practice any stress management or self-calming techniques as taught by your health care provider.  Do not punish yourself for setbacks or for not making progress.  Try to recognize your accomplishments, even if they are small.  Keep all follow-up visits as told by your health care provider. This is important. Contact a health care provider if:  Your symptoms do not get better.  Your symptoms get worse.  You have signs of depression, such as: ? A persistently sad, cranky, or irritable mood. ? Loss of enjoyment in activities that used to bring you joy. ? Change in weight or eating. ? Changes in sleeping habits. ? Avoiding friends or family members. ? Loss of energy for normal tasks. ? Feelings of guilt or worthlessness. Get help right away if:  You have serious thoughts about hurting yourself or others. If you ever feel like you may hurt yourself or others, or have thoughts about taking your own life, get help right away. You can go to your nearest emergency department or call:  Your local emergency services (911 in the [place]  A suicide crisis helpline, such as the [facility] at 9172735199. This is open 24 hours a day.  Summary  Generalized anxiety disorder (GAD) is a mental health disorder that involves worry that is not triggered by a specific event.  People with GAD often worry excessively about many things in their lives, such as their health and  family.  GAD may cause physical symptoms such as restlessness, trouble concentrating, sleep problems, frequent sweating, nausea, diarrhea, headaches, and trembling or muscle twitching.  A mental health specialist can help determine which treatment is best for you. Some people see improvement with one type of therapy. However, other people require a combination of therapies. This information is not intended to replace advice given to you by your health care provider. Make sure you discuss any questions you have with your health care provider. Document Released: 07/10/2012 Document Revised: 02/03/2016 Document Reviewed: 02/03/2016 Elsevier Interactive Patient Education  2018 [place] Stress Reduction Mindfulness-based stress reduction (MBSR) is a program that helps people learn to practice mindfulness. Mindfulness is the practice of intentionally paying attention to the present moment. It can be learned and practiced through techniques such as education, breathing exercises, meditation, and yoga. MBSR includes several mindfulness techniques in one program. MBSR works best when you understand the treatment, are willing to try new things, and can commit to spending time practicing what you learn. MBSR training may include learning about:  How your emotions, thoughts, and reactions affect your body.  New ways to respond to things that cause negative thoughts to start (triggers).  How to notice your thoughts and let go of them.  Practicing awareness of everyday things that you normally do without thinking.  The techniques and goals of different types of meditation.  What are the benefits of MBSR? MBSR can have many benefits, which include helping you to:  Develop self-awareness. This refers to knowing and understanding yourself.  Learn skills and attitudes that help you to participate in your own health care.  Learn new ways to care for yourself.  Be more  accepting about how things are, and let things go.  Be less judgmental and approach things with an open mind.  Be patient with yourself and trust yourself more.  MBSR has also been shown to:  Reduce negative emotions, such as depression and anxiety.  Improve memory and focus.  Change how you sense and approach pain.  Boost your body's ability to fight infections.  Help you connect better with other people.  Improve your sense of well-being.  Follow these instructions at home:  Find a local in-person or online MBSR program.  Set aside some time regularly for mindfulness practice.  Find a mindfulness practice that works best for you. This may include one or more of the following: ? Meditation. Meditation involves focusing your mind on a certain thought or activity. ? Breathing awareness exercises. These help you to stay present by focusing on your breath. ? Body scan. For this practice, you lie down and pay attention to each part of your body from head to toe. You can identify tension and soreness and intentionally relax parts of your body. ? Yoga. Yoga involves stretching and breathing, and it can improve your ability to move and be flexible. It can also provide an experience of testing your body's limits, which can help you release stress. ? Mindful eating. This way of eating involves focusing on the taste, texture, color, and smell of each bite of food. Because this slows down eating and helps you feel full sooner, it can be an important part of a weight-loss plan.  Find a podcast or recording that provides guidance for breathing awareness, body scan, or meditation exercises. You can listen to these any time when you have a free moment to rest without distractions.  Follow your treatment plan as told by your health care provider. This may include taking regular medicines and making changes to your diet or lifestyle as recommended. How to practice mindfulness To do a basic  awareness exercise:  Find a comfortable place to sit.  Pay attention to the present moment. Observe your thoughts, feelings, and surroundings just as they are.  Avoid placing judgment on yourself, your feelings, or your surroundings. Make note of any judgment that comes up, and let it go.  Your mind may wander, and that is okay. Make note of when your thoughts drift, and return your attention to the present moment.  To do basic mindfulness meditation:  Find a comfortable place to sit. This may include a stable chair or a firm floor cushion. ? Sit upright with your back straight. Let your arms fall next to your side with your hands resting on your legs. ? If sitting in a chair, rest your feet flat on the floor. ? If sitting on a cushion, cross your legs in front of you.  Keep your head in a neutral position with your chin dropped slightly. Relax your jaw and rest the tip of your tongue on the roof of your mouth. Drop your gaze to the floor. You can close your eyes if you like.  Breathe normally and pay attention to your breath. Feel the air moving in and out of your nose. Feel your belly expanding and relaxing  with each breath.  Your mind may wander, and that is okay. Make note of when your thoughts drift, and return your attention to your breath.  Avoid placing judgment on yourself, your feelings, or your surroundings. Make note of any judgment or feelings that come up, let them go, and bring your attention back to your breath.  When you are ready, lift your gaze or open your eyes. Pay attention to how your body feels after the meditation.  Where to find more information: You can find more information about MBSR from:  Your health care provider.  Community-based meditation centers or programs.  Programs offered near you.  Summary  Mindfulness-based stress reduction (MBSR) is a program that teaches you how to intentionally pay attention to the present moment. It is used with  other treatments to help you cope better with daily stress, emotions, and pain.  MBSR focuses on developing self-awareness, which allows you to respond to life stress without judgment or negative emotions.  MBSR programs may involve learning different mindfulness practices, such as breathing exercises, meditation, yoga, body scan, or mindful eating. Find a mindfulness practice that works best for you, and set aside time for it on a regular basis. This information is not intended to replace advice given to you by your health care provider. Make sure you discuss any questions you have with your health care provider. Document Released: 07/22/2016 Document Revised: 07/22/2016 Document Reviewed: 07/22/2016 Elsevier Interactive Patient Education  2018 [employer]  Please re-start with therapist! Recommend couples therapy as well. Please remain off Venlafaxine. Please start Sertraline (Zoloft) 25mg - Week one-one tab each am Week two- two tabs each am Please call clinic in 2 weeks or send MyChart message and let us know how you are feeling- we will adjust medication as needed.  One more refill on Lorazepam provided, will need controlled substance contract before anymore refills. Increase water intake, strive for at least 100 ounces/day.   Follow Heart Healthy diet Increase regular exercise.  Recommend at least 30 minutes daily, 5 days per week of walking, jogging, biking, swimming, YouTube/Pinterest workout videos. NICE TO SEE YOU!

## 2017-06-23 NOTE — Assessment & Plan Note (Signed)
CPE with fasting labs this summer

## 2017-07-13 ENCOUNTER — Encounter: Payer: Self-pay | Admitting: Adult Health

## 2017-07-25 ENCOUNTER — Other Ambulatory Visit: Payer: Self-pay | Admitting: Adult Health

## 2017-07-26 ENCOUNTER — Other Ambulatory Visit: Payer: Self-pay

## 2017-07-26 NOTE — Telephone Encounter (Signed)
Opened in error. T. Hadlie Gipson, CMA 

## 2017-08-29 ENCOUNTER — Other Ambulatory Visit: Payer: 59

## 2017-08-29 ENCOUNTER — Other Ambulatory Visit: Payer: Self-pay

## 2017-08-29 DIAGNOSIS — Z0000 Encounter for general adult medical examination without abnormal findings: Secondary | ICD-10-CM | POA: Diagnosis not present

## 2017-08-30 LAB — COMPREHENSIVE METABOLIC PANEL
ALK PHOS: 98 IU/L (ref 39–117)
ALT: 294 IU/L — AB (ref 0–32)
AST: 229 IU/L — AB (ref 0–40)
Albumin/Globulin Ratio: 1.4 (ref 1.2–2.2)
Albumin: 3.9 g/dL (ref 3.5–5.5)
BUN/Creatinine Ratio: 14 (ref 9–23)
BUN: 10 mg/dL (ref 6–20)
Bilirubin Total: 0.4 mg/dL (ref 0.0–1.2)
CHLORIDE: 106 mmol/L (ref 96–106)
CO2: 18 mmol/L — ABNORMAL LOW (ref 20–29)
CREATININE: 0.72 mg/dL (ref 0.57–1.00)
Calcium: 9 mg/dL (ref 8.7–10.2)
GFR calc Af Amer: 135 mL/min/{1.73_m2} (ref >59)
GFR calc non Af Amer: 117 mL/min/{1.73_m2} (ref >59)
GLUCOSE: 81 mg/dL (ref 65–99)
Globulin, Total: 2.7 g/dL (ref 1.5–4.5)
Potassium: 4.5 mmol/L (ref 3.5–5.2)
Sodium: 142 mmol/L (ref 134–144)
Total Protein: 6.6 g/dL (ref 6.0–8.5)

## 2017-08-30 LAB — CBC WITH DIFFERENTIAL/PLATELET
BASOS ABS: 0 10*3/uL (ref 0.0–0.2)
Basos: 0 %
EOS (ABSOLUTE): 0.2 10*3/uL (ref 0.0–0.4)
Eos: 1 %
HEMATOCRIT: 43.5 % (ref 34.0–46.6)
Hemoglobin: 14.2 g/dL (ref 11.1–15.9)
IMMATURE GRANULOCYTES: 0 %
Immature Grans (Abs): 0 10*3/uL (ref 0.0–0.1)
Lymphocytes Absolute: 3.4 10*3/uL — ABNORMAL HIGH (ref 0.7–3.1)
Lymphs: 33 %
MCH: 28 pg (ref 26.6–33.0)
MCHC: 32.6 g/dL (ref 31.5–35.7)
MCV: 86 fL (ref 79–97)
MONOCYTES: 7 %
Monocytes Absolute: 0.7 10*3/uL (ref 0.1–0.9)
NEUTROS PCT: 59 %
Neutrophils Absolute: 6 10*3/uL (ref 1.4–7.0)
Platelets: 244 10*3/uL (ref 150–450)
RBC: 5.07 x10E6/uL (ref 3.77–5.28)
RDW: 13.2 % (ref 12.3–15.4)
WBC: 10.4 10*3/uL (ref 3.4–10.8)

## 2017-08-30 LAB — HEMOGLOBIN A1C
ESTIMATED AVERAGE GLUCOSE: 100 mg/dL
HEMOGLOBIN A1C: 5.1 % (ref 4.8–5.6)

## 2017-08-30 LAB — LIPID PANEL
CHOL/HDL RATIO: 3.6 ratio (ref 0.0–4.4)
Cholesterol, Total: 203 mg/dL — ABNORMAL HIGH (ref 100–199)
HDL: 57 mg/dL (ref >39)
LDL Calculated: 118 mg/dL — ABNORMAL HIGH (ref 0–99)
TRIGLYCERIDES: 138 mg/dL (ref 0–149)
VLDL Cholesterol Cal: 28 mg/dL (ref 5–40)

## 2017-08-30 LAB — TSH: TSH: 3.57 u[IU]/mL (ref 0.450–4.500)

## 2017-09-13 NOTE — Progress Notes (Signed)
Subjective:    Patient ID: Lisa Logan, female    DOB: [birth year], 27 y.o.   MRN: [id number]  HPI:  Lisa Logan is here for CPE She reports taking Sertraline 50mg  QD and reports depression sx's only minimally improved. She denies thoughts if harming herself/others. He overall anxiety has decreased, she has not used lorazepam 1mg  since Jan 2019 Reviewed all recent lab, notable for elevated LFTs.  Hepatic panel drawn today. She reports taking OTC Acetaminophen, 2-3 tabs 2-3 times/week to treat HA sx's She also estimates to drink "bottle of wine/week" She reports brief, intermittent RUQ pain occurring since >5 years She denies hx of elevated LFTs or hepatic steatosis dx in past She has been swimming 1-2 days a week and been trying increase daily walking, however has gained 5 lbs since last OV March 2019 She has hx + HPV, she reports that her last PAP 2-3 yrs ago was neg [facility]  Healthcare Maintenance: PAP-complete today Mammogram-not indiacted Immunizations-UTD  Patient Care Team    Relationship Specialty Notifications Start End  KingstonDanford, Orpha BurKaty D, NP PCP - General Family Medicine  01/26/17   [place] SpanielWillis, [name], MD Consulting Physician Neurology  02/15/17   Conard NovakJackson, Stephen D, MD Attending Physician Obstetrics and Gynecology  02/15/17     Patient Active Problem List   Diagnosis Date Noted  . Elevated LFTs 09/14/2017  . 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 Amenorrhea 04/11/2017  . Encounter for examination following treatment at hospital 04/11/2017  . Depression 03/30/2017  . Morbid obesity (HCC) 02/15/2017  . Healthcare maintenance 02/15/2017  . Migraine 07/14/2016  . Abdominal cramping 07/14/2016  . Anxiety 07/14/2016     Past Medical History:  Diagnosis Date  . Arm fracture   . C. difficile colitis   . Depression   . HPV in female   . Migraine   . Obesity   . Vitamin D deficiency      Past Surgical History:  Procedure Laterality Date  . NO PAST  SURGERIES       Family History  Problem Relation Age of Onset  . Arthritis Mother   . Mental illness Mother   . Cancer Mother        skin   . Migraines Mother   . Depression Mother   . Alcohol abuse Father   . Hypertension Father   . Mental illness Father   . Depression Father   . Cancer Maternal Aunt        Bone  . Depression Maternal Aunt   . Arthritis Maternal Grandmother   . Stroke Maternal Grandmother   . Mental illness Maternal Grandmother   . Skin cancer Maternal Grandmother   . Cancer Maternal Grandmother        skin  . Cancer Maternal Grandfather        lung  . Mental illness Maternal Grandfather   . Cancer Paternal Grandmother        Skin  . Depression Sister   . Depression Brother   . Cancer Maternal Uncle        skin  . Depression Maternal Uncle      Social History   Substance and Sexual Activity  Drug Use No     Social History   Substance and Sexual Activity  Alcohol Use Yes   Comment: occassional     Social History   Tobacco Use  Smoking Status Never Smoker  Smokeless Tobacco Never Used     Outpatient  Encounter Medications as of 09/14/2017  Medication Sig  . cyclobenzaprine (FLEXERIL) 5 MG tablet Take 1 tablet (5 mg total) by mouth at bedtime as needed for muscle spasms.  Marland Kitchen dicyclomine (BENTYL) 10 MG capsule Take 1 capsule (10 mg total) by mouth 2 (two) times daily as needed for spasms.  Marland Kitchen LORazepam (ATIVAN) 1 MG tablet 1 tablet by mouth every 8 hrs as needed for acute anxiety  . norethindrone-ethinyl estradiol 1/35 (ORTHO-NOVUM 1/35, 28,) tablet Take 1 tablet by mouth daily.  . ondansetron (ZOFRAN-ODT) 8 MG disintegrating tablet Take 1 tablet (8 mg total) by mouth every 8 (eight) hours as needed for nausea or vomiting.  . sertraline (ZOLOFT) 100 MG tablet Take 1 tablet (100 mg total) by mouth daily.  . [DISCONTINUED] sertraline (ZOLOFT) 25 MG tablet Take 2 tablets (50 mg total) by mouth daily.   No facility-administered encounter  medications on file as of 09/14/2017.     Allergies: Culturelle [lactobacillus] and Penicillins  Body mass index is 55.77 kg/m.  Blood pressure 127/86, pulse 80, height 5' 2.01" (1.575 m), weight (!) 305 lb (138.3 kg), SpO2 99 %.  Review of Systems  Constitutional: Positive for fatigue. Negative for activity change, appetite change, chills, diaphoresis, fever and unexpected weight change.  HENT: Negative for congestion.   Eyes: Negative for visual disturbance.  Respiratory: Negative for cough, chest tightness, shortness of breath, wheezing and stridor.   Cardiovascular: Negative for chest pain, palpitations and leg swelling.  Gastrointestinal: Negative for abdominal distention, anal bleeding, blood in stool, constipation, diarrhea, nausea and vomiting.  Endocrine: Negative for cold intolerance, heat intolerance, polydipsia, polyphagia and polyuria.  Genitourinary: Negative for difficulty urinating, flank pain, hematuria and pelvic pain.  Musculoskeletal: Negative for arthralgias, back pain, gait problem, joint swelling, myalgias, neck pain and neck stiffness.  Skin: Negative for color change, pallor, rash and wound.  Neurological: Positive for headaches. Negative for dizziness.  Hematological: Does not bruise/bleed easily.  Psychiatric/Behavioral: Negative for decreased concentration, dysphoric mood, hallucinations, self-injury, sleep disturbance and suicidal ideas. The patient is not nervous/anxious and is not hyperactive.        Objective:   Physical Exam  Constitutional: She is oriented to person, place, and time. She appears well-developed and well-nourished. No distress.  HENT:  Head: Normocephalic and atraumatic.  Right Ear: Hearing, tympanic membrane, external ear and ear canal normal. Tympanic membrane is not erythematous and not bulging. No decreased hearing is noted.  Left Ear: Hearing, tympanic membrane, external ear and ear canal normal. Tympanic membrane is not  erythematous and not bulging. No decreased hearing is noted.  Mouth/Throat: Oropharynx is clear and moist.  Eyes: Pupils are equal, round, and reactive to light. Conjunctivae, EOM and lids are normal.  Neck: Normal range of motion. Neck supple.  Cardiovascular: Normal rate, regular rhythm, normal heart sounds and intact distal pulses.  No murmur heard. Pulmonary/Chest: Effort normal and breath sounds normal. No stridor. No respiratory distress. She has no decreased breath sounds. She has no wheezes. She has no rhonchi. She has no rales. She exhibits no tenderness. Right breast exhibits no inverted nipple, no mass, no nipple discharge, no skin change and no tenderness. Left breast exhibits no inverted nipple, no mass, no nipple discharge, no skin change and no tenderness.  Abdominal: Soft. Bowel sounds are normal. She exhibits no distension. There is no rebound, no guarding, no CVA tenderness, no tenderness at McBurney's point and negative Murphy's sign. No hernia.  Very difficult to palpate abdominal organ sue to  habitus  Genitourinary: Vagina normal and uterus normal. Pelvic exam was performed with patient supine. No vaginal discharge found.  Genitourinary Comments: Chaperone present during examination.  Musculoskeletal: Normal range of motion. She exhibits no edema or tenderness.  Lymphadenopathy:    She has no cervical adenopathy.  Neurological: She is alert and oriented to person, place, and time. Coordination normal.  Skin: Skin is warm and dry. Capillary refill takes less than 2 seconds. No rash noted. She is not diaphoretic. No erythema. No pallor.  Psychiatric: She has a normal mood and affect. Her behavior is normal. Judgment and thought content normal.  Nursing note and vitals reviewed.     Assessment & Plan:   1. Elevated LFTs   2. Depression, unspecified depression type   3. Morbid obesity (HCC)   4. Healthcare maintenance   5. Migraine with aura and without status migrainosus,  not intractable   6. HPV in female     Depression Denies thoughts of harming herself/others Depression sx's only minimally controlled Depression screen [facility] 2/9 09/14/2017 06/23/2017 04/11/2017  Decreased Interest 2 0 0  Down, Depressed, Hopeless 3 2 2   PHQ - 2 Score 5 2 2   Altered sleeping 3 3 3   Tired, decreased energy 2 2 3   Change in appetite 2 3 3   Feeling bad or failure about yourself  3 3 3   Trouble concentrating 1 0 1  Moving slowly or fidgety/restless 0 0 0  Suicidal thoughts 0 0 0  PHQ-9 Score 16 13 15   Difficult doing work/chores Somewhat difficult Somewhat difficult Not difficult at all   Increased Sertraline from 50mg  to 100mg  QD   Elevated LFTs Hepatic panel drawn today If still elevated will send for Abd [place], referral to GI if appropriate  Healthcare maintenance Sertraline dosage increased from 50mg  to 100mg  once daily. Continue all other medications as directed. Increase water intake, strive for at least 100 ounces/day.   Follow Heart Healthy diet Increase regular exercise.  Recommend at least 30 minutes daily, 5 days per week of walking, jogging, biking, swimming, YouTube/Pinterest workout videos. We will call you when lab and PAP results are available. Recommend follow-up in 6 months.  Migraine She denies aura with Migraine Advised to reduce OTC Acetaminophen use to treat HA sx's- elevated LFTs Recommend that you follow-up with Neurologist, re: migraine headache treatment.  Morbid obesity (HCC) Body mass index is 55.77 kg/m.  Current wt 305 Increase water intake, strive for at least 100 ounces/day.   Follow Heart Healthy diet Increase regular exercise.  Recommend at least 30 minutes daily, 5 days per week of walking, jogging, biking, swimming, YouTube/Pinterest workout videos.  HPV in female PAP completed today Referral to OB/GYN placed    FOLLOW-UP:  Return in about 6 months (around 03/16/2018).

## 2017-09-14 ENCOUNTER — Telehealth: Payer: Self-pay | Admitting: Obstetrics and Gynecology

## 2017-09-14 ENCOUNTER — Encounter: Payer: Self-pay | Admitting: Adult Health

## 2017-09-14 ENCOUNTER — Ambulatory Visit (INDEPENDENT_AMBULATORY_CARE_PROVIDER_SITE_OTHER): Payer: 59 | Admitting: Adult Health

## 2017-09-14 ENCOUNTER — Other Ambulatory Visit (HOSPITAL_COMMUNITY)
Admission: RE | Admit: 2017-09-14 | Discharge: 2017-09-14 | Disposition: A | Discharge status: Home / Self Care | Payer: 59 | Source: Ambulatory Visit | Attending: Adult Health | Admitting: Adult Health

## 2017-09-14 VITALS — BP 127/86 | HR 80 | Ht 62.01 in | Wt 305.0 lb

## 2017-09-14 DIAGNOSIS — R87610 Atypical squamous cells of undetermined significance on cytologic smear of cervix (ASC-US): Secondary | ICD-10-CM | POA: Diagnosis not present

## 2017-09-14 DIAGNOSIS — Z0000 Encounter for general adult medical examination without abnormal findings: Secondary | ICD-10-CM

## 2017-09-14 DIAGNOSIS — F329 Major depressive disorder, single episode, unspecified: Secondary | ICD-10-CM | POA: Diagnosis not present

## 2017-09-14 DIAGNOSIS — Z01419 Encounter for gynecological examination (general) (routine) without abnormal findings: Secondary | ICD-10-CM | POA: Diagnosis not present

## 2017-09-14 DIAGNOSIS — B977 Papillomavirus as the cause of diseases classified elsewhere: Secondary | ICD-10-CM | POA: Diagnosis present

## 2017-09-14 DIAGNOSIS — R945 Abnormal results of liver function studies: Secondary | ICD-10-CM

## 2017-09-14 DIAGNOSIS — F32A Depression, unspecified: Secondary | ICD-10-CM

## 2017-09-14 DIAGNOSIS — G43109 Migraine with aura, not intractable, without status migrainosus: Secondary | ICD-10-CM

## 2017-09-14 DIAGNOSIS — R7989 Other specified abnormal findings of blood chemistry: Secondary | ICD-10-CM | POA: Insufficient documentation

## 2017-09-14 MED ORDER — SERTRALINE HCL 100 MG PO TABS: 100.0000 mg | ORAL_TABLET | Freq: Every day | ORAL | 1 refills | Status: DC

## 2017-09-14 NOTE — Assessment & Plan Note (Signed)
Body mass index is 55.77 kg/m.  Current wt 305 Increase water intake, strive for at least 100 ounces/day.   Follow Heart Healthy diet Increase regular exercise.  Recommend at least 30 minutes daily, 5 days per week of walking, jogging, biking, swimming, YouTube/Pinterest workout videos.

## 2017-09-14 NOTE — Assessment & Plan Note (Addendum)
Hepatic panel drawn today If still elevated will send for Abd [place], referral to GI if appropriate

## 2017-09-14 NOTE — Assessment & Plan Note (Signed)
PAP completed today Referral to OB/GYN placed

## 2017-09-14 NOTE — Assessment & Plan Note (Signed)
Sertraline dosage increased from 50mg  to 100mg  once daily. Continue all other medications as directed. Increase water intake, strive for at least 100 ounces/day.   Follow Heart Healthy diet Increase regular exercise.  Recommend at least 30 minutes daily, 5 days per week of walking, jogging, biking, swimming, YouTube/Pinterest workout videos. We will call you when lab and PAP results are available. Recommend follow-up in 6 months.

## 2017-09-14 NOTE — Patient Instructions (Addendum)
Preventive Care for Adults, Female  A healthy lifestyle and preventive care can promote health and wellness. Preventive health guidelines for women include the following key practices.   A routine yearly physical is a good way to check with your health care provider about your health and preventive screening. It is a chance to share any concerns and updates on your health and to receive a thorough exam.   Visit your dentist for a routine exam and preventive care every 6 months. Brush your teeth twice a day and floss once a day. Good oral hygiene prevents tooth decay and gum disease.   The frequency of eye exams is based on your age, health, family medical history, use of contact lenses, and other factors. Follow your health care provider's recommendations for frequency of eye exams.   Eat a healthy diet. Foods like vegetables, fruits, whole grains, low-fat dairy products, and lean protein foods contain the nutrients you need without too many calories. Decrease your intake of foods high in solid fats, added sugars, and salt. Eat the right amount of calories for you.Get information about a proper diet from your health care provider, if necessary.   Regular physical exercise is one of the most important things you can do for your health. Most adults should get at least 150 minutes of moderate-intensity exercise (any activity that increases your heart rate and causes you to sweat) each week. In addition, most adults need muscle-strengthening exercises on 2 or more days a week.   Maintain a healthy weight. The body mass index (BMI) is a screening tool to identify possible weight problems. It provides an estimate of body fat based on height and weight. Your health care provider can find your BMI, and can help you achieve or maintain a healthy weight.For adults 20 years and older:   - A BMI below 18.5 is considered underweight.   - A BMI of 18.5 to 24.9 is normal.   - A BMI of 25 to 29.9 is  considered overweight.   - A BMI of 30 and above is considered obese.   Maintain normal blood lipids and cholesterol levels by exercising and minimizing your intake of trans and saturated fats.  Eat a balanced diet with plenty of fruit and vegetables. Blood tests for lipids and cholesterol should begin at age 20 and be repeated every 5 years minimum.  If your lipid or cholesterol levels are high, you are over 40, or you are at high risk for heart disease, you may need your cholesterol levels checked more frequently.Ongoing high lipid and cholesterol levels should be treated with medicines if diet and exercise are not working.   If you smoke, find out from your health care provider how to quit. If you do not use tobacco, do not start.   Lung cancer screening is recommended for adults aged 55-80 years who are at high risk for developing lung cancer because of a history of smoking. A yearly low-dose CT scan of the lungs is recommended for people who have at least a 30-pack-year history of smoking and are a current smoker or have quit within the past 15 years. A pack year of smoking is smoking an average of 1 pack of cigarettes a day for 1 year (for example: 1 pack a day for 30 years or 2 packs a day for 15 years). Yearly screening should continue until the smoker has stopped smoking for at least 15 years. Yearly screening should be stopped for people who develop a   health problem that would prevent them from having lung cancer treatment.   If you are pregnant, do not drink alcohol. If you are breastfeeding, be very cautious about drinking alcohol. If you are not pregnant and choose to drink alcohol, do not have more than 1 drink per day. One drink is considered to be 12 ounces (355 mL) of beer, 5 ounces (148 mL) of wine, or 1.5 ounces (44 mL) of liquor.   Avoid use of street drugs. Do not share needles with anyone. Ask for help if you need support or instructions about stopping the use of  drugs.   High blood pressure causes heart disease and increases the risk of stroke. Your blood pressure should be checked at least yearly.  Ongoing high blood pressure should be treated with medicines if weight loss and exercise do not work.   If you are 69-55 years old, ask your health care provider if you should take aspirin to prevent strokes.   Diabetes screening involves taking a blood sample to check your fasting blood sugar level. This should be done once every 3 years, after age 38, if you are within normal weight and without risk factors for diabetes. Testing should be considered at a younger age or be carried out more frequently if you are overweight and have at least 1 risk factor for diabetes.   Breast cancer screening is essential preventive care for women. You should practice "breast self-awareness."  This means understanding the normal appearance and feel of your breasts and may include breast self-examination.  Any changes detected, no matter how small, should be reported to a health care provider.  Women in their 80s and 30s should have a clinical breast exam (CBE) by a health care provider as part of a regular health exam every 1 to 3 years.  After age 66, women should have a CBE every year.  Starting at age 1, women should consider having a mammogram (breast X-ray test) every year.  Women who have a family history of breast cancer should talk to their health care provider about genetic screening.  Women at a high risk of breast cancer should talk to their health care providers about having an MRI and a mammogram every year.   -Breast cancer gene (BRCA)-related cancer risk assessment is recommended for women who have family members with BRCA-related cancers. BRCA-related cancers include breast, ovarian, tubal, and peritoneal cancers. Having family members with these cancers may be associated with an increased risk for harmful changes (mutations) in the breast cancer genes BRCA1 and  BRCA2. Results of the assessment will determine the need for genetic counseling and BRCA1 and BRCA2 testing.   The Pap test is a screening test for cervical cancer. A Pap test can show cell changes on the cervix that might become cervical cancer if left untreated. A Pap test is a procedure in which cells are obtained and examined from the lower end of the uterus (cervix).   - Women should have a Pap test starting at age 57.   - Between ages 90 and 70, Pap tests should be repeated every 2 years.   - Beginning at age 63, you should have a Pap test every 3 years as long as the past 3 Pap tests have been normal.   - Some women have medical problems that increase the chance of getting cervical cancer. Talk to your health care provider about these problems. It is especially important to talk to your health care provider if a  new problem develops soon after your last Pap test. In these cases, your health care provider may recommend more frequent screening and Pap tests.   - The above recommendations are the same for women who have or have not gotten the vaccine for human papillomavirus (HPV).   - If you had a hysterectomy for a problem that was not cancer or a condition that could lead to cancer, then you no longer need Pap tests. Even if you no longer need a Pap test, a regular exam is a good idea to make sure no other problems are starting.   - If you are between ages 36 and 66 years, and you have had normal Pap tests going back 10 years, you no longer need Pap tests. Even if you no longer need a Pap test, a regular exam is a good idea to make sure no other problems are starting.   - If you have had past treatment for cervical cancer or a condition that could lead to cancer, you need Pap tests and screening for cancer for at least 20 years after your treatment.   - If Pap tests have been discontinued, risk factors (such as a new sexual partner) need to be reassessed to determine if screening should  be resumed.   - The HPV test is an additional test that may be used for cervical cancer screening. The HPV test looks for the virus that can cause the cell changes on the cervix. The cells collected during the Pap test can be tested for HPV. The HPV test could be used to screen women aged 70 years and older, and should be used in women of any age who have unclear Pap test results. After the age of 67, women should have HPV testing at the same frequency as a Pap test.   Colorectal cancer can be detected and often prevented. Most routine colorectal cancer screening begins at the age of 57 years and continues through age 26 years. However, your health care provider may recommend screening at an earlier age if you have risk factors for colon cancer. On a yearly basis, your health care provider may provide home test kits to check for hidden blood in the stool.  Use of a small camera at the end of a tube, to directly examine the colon (sigmoidoscopy or colonoscopy), can detect the earliest forms of colorectal cancer. Talk to your health care provider about this at age 23, when routine screening begins. Direct exam of the colon should be repeated every 5 -10 years through age 49 years, unless early forms of pre-cancerous polyps or small growths are found.   People who are at an increased risk for hepatitis B should be screened for this virus. You are considered at high risk for hepatitis B if:  -You were born in a country where hepatitis B occurs often. Talk with your health care provider about which countries are considered high risk.  - Your parents were born in a high-risk country and you have not received a shot to protect against hepatitis B (hepatitis B vaccine).  - You have HIV or AIDS.  - You use needles to inject street drugs.  - You live with, or have sex with, someone who has Hepatitis B.  - You get hemodialysis treatment.  - You take certain medicines for conditions like cancer, organ  transplantation, and autoimmune conditions.   Hepatitis C blood testing is recommended for all people born from [birth year] through 1965 and any individual  with known risks for hepatitis C.   Practice safe sex. Use condoms and avoid high-risk sexual practices to reduce the spread of sexually transmitted infections (STIs). STIs include gonorrhea, chlamydia, syphilis, trichomonas, herpes, HPV, and human immunodeficiency virus (HIV). Herpes, HIV, and HPV are viral illnesses that have no cure. They can result in disability, cancer, and death. Sexually active women aged 25 years and younger should be checked for chlamydia. Older women with new or multiple partners should also be tested for chlamydia. Testing for other STIs is recommended if you are sexually active and at increased risk.   Osteoporosis is a disease in which the bones lose minerals and strength with aging. This can result in serious bone fractures or breaks. The risk of osteoporosis can be identified using a bone density scan. Women ages 65 years and over and women at risk for fractures or osteoporosis should discuss screening with their health care providers. Ask your health care provider whether you should take a calcium supplement or vitamin D to There are also several preventive steps women can take to avoid osteoporosis and resulting fractures or to keep osteoporosis from worsening. -->Recommendations include:  Eat a balanced diet high in fruits, vegetables, calcium, and vitamins.  Get enough calcium. The recommended total intake of is 1,200 mg daily; for best absorption, if taking supplements, divide doses into 250-500 mg doses throughout the day. Of the two types of calcium, calcium carbonate is best absorbed when taken with food but calcium citrate can be taken on an empty stomach.  Get enough vitamin D. NAMS and the National Osteoporosis Foundation recommend at least 1,000 IU per day for women age 50 and over who are at risk of vitamin D  deficiency. Vitamin D deficiency can be caused by inadequate sun exposure (for example, those who live in northern latitudes).  Avoid alcohol and smoking. Heavy alcohol intake (more than 7 drinks per week) increases the risk of falls and hip fracture and women smokers tend to lose bone more rapidly and have lower bone mass than nonsmokers. Stopping smoking is one of the most important changes women can make to improve their health and decrease risk for disease.  Be physically active every day. Weight-bearing exercise (for example, fast walking, hiking, jogging, and weight training) may strengthen bones or slow the rate of bone loss that comes with aging. Balancing and muscle-strengthening exercises can reduce the risk of falling and fracture.  Consider therapeutic medications. Currently, several types of effective drugs are available. Healthcare providers can recommend the type most appropriate for each woman.  Eliminate environmental factors that may contribute to accidents. Falls cause nearly 90% of all osteoporotic fractures, so reducing this risk is an important bone-health strategy. Measures include ample lighting, removing obstructions to walking, using nonskid rugs on floors, and placing mats and/or grab bars in showers.  Be aware of medication side effects. Some common medicines make bones weaker. These include a type of steroid drug called glucocorticoids used for arthritis and asthma, some antiseizure drugs, certain sleeping pills, treatments for endometriosis, and some cancer drugs. An overactive thyroid gland or using too much thyroid hormone for an underactive thyroid can also be a problem. If you are taking these medicines, talk to your doctor about what you can do to help protect your bones.reduce the rate of osteoporosis.    Menopause can be associated with physical symptoms and risks. Hormone replacement therapy is available to decrease symptoms and risks. You should talk to your  health care provider   about whether hormone replacement therapy is right for you.   Use sunscreen. Apply sunscreen liberally and repeatedly throughout the day. You should seek shade when your shadow is shorter than you. Protect yourself by wearing long sleeves, pants, a wide-brimmed hat, and sunglasses year round, whenever you are outdoors.   Once a month, do a whole body skin exam, using a mirror to look at the skin on your back. Tell your health care provider of new moles, moles that have irregular borders, moles that are larger than a pencil eraser, or moles that have changed in shape or color.   -Stay current with required vaccines (immunizations).   Influenza vaccine. All adults should be immunized every year.  Tetanus, diphtheria, and acellular pertussis (Td, Tdap) vaccine. Pregnant women should receive 1 dose of Tdap vaccine during each pregnancy. The dose should be obtained regardless of the length of time since the last dose. Immunization is preferred during the 27th 36th week of gestation. An adult who has not previously received Tdap or who does not know her vaccine status should receive 1 dose of Tdap. This initial dose should be followed by tetanus and diphtheria toxoids (Td) booster doses every 10 years. Adults with an unknown or incomplete history of completing a 3-dose immunization series with Td-containing vaccines should begin or complete a primary immunization series including a Tdap dose. Adults should receive a Td booster every 10 years.  Varicella vaccine. An adult without evidence of immunity to varicella should receive 2 doses or a second dose if she has previously received 1 dose. Pregnant females who do not have evidence of immunity should receive the first dose after pregnancy. This first dose should be obtained before leaving the health care facility. The second dose should be obtained 4 8 weeks after the first dose.  Human papillomavirus (HPV) vaccine. Females aged 13 26  years who have not received the vaccine previously should obtain the 3-dose series. The vaccine is not recommended for use in pregnant females. However, pregnancy testing is not needed before receiving a dose. If a female is found to be pregnant after receiving a dose, no treatment is needed. In that case, the remaining doses should be delayed until after the pregnancy. Immunization is recommended for any person with an immunocompromised condition through the age of 26 years if she did not get any or all doses earlier. During the 3-dose series, the second dose should be obtained 4 8 weeks after the first dose. The third dose should be obtained 24 weeks after the first dose and 16 weeks after the second dose.  Zoster vaccine. One dose is recommended for adults aged 60 years or older unless certain conditions are present.  Measles, mumps, and rubella (MMR) vaccine. Adults born before 1957 generally are considered immune to measles and mumps. Adults born in 1957 or later should have 1 or more doses of MMR vaccine unless there is a contraindication to the vaccine or there is laboratory evidence of immunity to each of the three diseases. A routine second dose of MMR vaccine should be obtained at least 28 days after the first dose for students attending postsecondary schools, health care workers, or international travelers. People who received inactivated measles vaccine or an unknown type of measles vaccine during 1963 1967 should receive 2 doses of MMR vaccine. People who received inactivated mumps vaccine or an unknown type of mumps vaccine before 1979 and are at high risk for mumps infection should consider immunization with 2 doses of   MMR vaccine. For females of childbearing age, rubella immunity should be determined. If there is no evidence of immunity, females who are not pregnant should be vaccinated. If there is no evidence of immunity, females who are pregnant should delay immunization until after pregnancy.  Unvaccinated health care workers born before [birth year] who lack laboratory evidence of measles, mumps, or rubella immunity or laboratory confirmation of disease should consider measles and mumps immunization with 2 doses of MMR vaccine or rubella immunization with 1 dose of MMR vaccine.  Pneumococcal 13-valent conjugate (PCV13) vaccine. When indicated, a person who is uncertain of her immunization history and has no record of immunization should receive the PCV13 vaccine. An adult aged 54 years or older who has certain medical conditions and has not been previously immunized should receive 1 dose of PCV13 vaccine. This PCV13 should be followed with a dose of pneumococcal polysaccharide (PPSV23) vaccine. The PPSV23 vaccine dose should be obtained at least 8 weeks after the dose of PCV13 vaccine. An adult aged 58 years or older who has certain medical conditions and previously received 1 or more doses of PPSV23 vaccine should receive 1 dose of PCV13. The PCV13 vaccine dose should be obtained 1 or more years after the last PPSV23 vaccine dose.  Pneumococcal polysaccharide (PPSV23) vaccine. When PCV13 is also indicated, PCV13 should be obtained first. All adults aged 58 years and older should be immunized. An adult younger than age 65 years who has certain medical conditions should be immunized. Any person who resides in a nursing home or long-term care facility should be immunized. An adult smoker should be immunized. People with an immunocompromised condition and certain other conditions should receive both PCV13 and PPSV23 vaccines. People with human immunodeficiency virus (HIV) infection should be immunized as soon as possible after diagnosis. Immunization during chemotherapy or radiation therapy should be avoided. Routine use of PPSV23 vaccine is not recommended for American Indians, [place] Natives, or people younger than 65 years unless there are medical conditions that require PPSV23 vaccine. When indicated,  people who have unknown immunization and have no record of immunization should receive PPSV23 vaccine. One-time revaccination 5 years after the first dose of PPSV23 is recommended for people aged 70 64 years who have chronic kidney failure, nephrotic syndrome, asplenia, or immunocompromised conditions. People who received 1 2 doses of PPSV23 before age 32 years should receive another dose of PPSV23 vaccine at age 96 years or later if at least 5 years have passed since the previous dose. Doses of PPSV23 are not needed for people immunized with PPSV23 at or after age 55 years.  Meningococcal vaccine. Adults with asplenia or persistent complement component deficiencies should receive 2 doses of quadrivalent meningococcal conjugate (MenACWY-D) vaccine. The doses should be obtained at least 2 months apart. Microbiologists working with certain meningococcal bacteria, [employer] recruits, people at risk during an outbreak, and people who travel to or live in countries with a high rate of meningitis should be immunized. A first-year college student up through age 58 years who is living in a residence hall should receive a dose if she did not receive a dose on or after her 16th birthday. Adults who have certain high-risk conditions should receive one or more doses of vaccine.  Hepatitis A vaccine. Adults who wish to be protected from this disease, have certain high-risk conditions, work with hepatitis A-infected animals, work in hepatitis A research labs, or travel to or work in countries with a high rate of hepatitis A should be  immunized. Adults who were previously unvaccinated and who anticipate close contact with an international adoptee during the first 60 days after arrival in the [place] from a country with a high rate of hepatitis A should be immunized.  Hepatitis B vaccine.  Adults who wish to be protected from this disease, have certain high-risk conditions, may be exposed to blood or other infectious  body fluids, are household contacts or sex partners of hepatitis B positive people, are clients or workers in certain care facilities, or travel to or work in countries with a high rate of hepatitis B should be immunized.  Haemophilus influenzae type b (Hib) vaccine. A previously unvaccinated person with asplenia or sickle cell disease or having a scheduled splenectomy should receive 1 dose of Hib vaccine. Regardless of previous immunization, a recipient of a hematopoietic stem cell transplant should receive a 3-dose series 6 12 months after her successful transplant. Hib vaccine is not recommended for adults with HIV infection.  Preventive Services / Frequency Ages 6 to 39years  Blood pressure check.** / Every 1 to 2 years.  Lipid and cholesterol check.** / Every 5 years beginning at age 39.  Clinical breast exam.** / Every 3 years for women in their 61s and 62s.  BRCA-related cancer risk assessment.** / For women who have family members with a BRCA-related cancer (breast, ovarian, tubal, or peritoneal cancers).  Pap test.** / Every 2 years from ages 47 through 85. Every 3 years starting at age 34 through age 12 or 74 with a history of 3 consecutive normal Pap tests.  HPV screening.** / Every 3 years from ages 46 through ages 43 to 54 with a history of 3 consecutive normal Pap tests.  Hepatitis C blood test.** / For any individual with known risks for hepatitis C.  Skin self-exam. / Monthly.  Influenza vaccine. / Every year.  Tetanus, diphtheria, and acellular pertussis (Tdap, Td) vaccine.** / Consult your health care provider. Pregnant women should receive 1 dose of Tdap vaccine during each pregnancy. 1 dose of Td every 10 years.  Varicella vaccine.** / Consult your health care provider. Pregnant females who do not have evidence of immunity should receive the first dose after pregnancy.  HPV vaccine. / 3 doses over 6 months, if 64 and younger. The vaccine is not recommended for use in  pregnant females. However, pregnancy testing is not needed before receiving a dose.  Measles, mumps, rubella (MMR) vaccine.** / You need at least 1 dose of MMR if you were born in 1957 or later. You may also need a 2nd dose. For females of childbearing age, rubella immunity should be determined. If there is no evidence of immunity, females who are not pregnant should be vaccinated. If there is no evidence of immunity, females who are pregnant should delay immunization until after pregnancy.  Pneumococcal 13-valent conjugate (PCV13) vaccine.** / Consult your health care provider.  Pneumococcal polysaccharide (PPSV23) vaccine.** / 1 to 2 doses if you smoke cigarettes or if you have certain conditions.  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 You may also need additional booster doses.  Hepatitis A vaccine.** / Consult your health care provider.  Hepatitis B vaccine.** / Consult your health care provider.  Haemophilus influenzae type b (Hib) vaccine.** / Consult your health care provider.  Ages 55 to 64years  Blood pressure check.** / Every 1 to 2 years.  Lipid and cholesterol check.** / Every 5 years beginning at age 20 years.  Lung cancer screening. / Every year if you are aged 55 80 years and have a 30-pack-year history of smoking and currently smoke or have quit within the past 15 years. Yearly screening is stopped once you have quit smoking for at least 15 years or develop a health problem that would prevent you from having lung cancer treatment.  Clinical breast exam.** / Every year after age 40 years.  BRCA-related cancer risk assessment.** / For women who have family members with a BRCA-related cancer (breast, ovarian, tubal, or peritoneal cancers).  Mammogram.** / Every year beginning at age 40  years and continuing for as long as you are in good health. Consult with your health care provider.  Pap test.** / Every 3 years starting at age 30 years through age 65 or 70 years with a history of 3 consecutive normal Pap tests.  HPV screening.** / Every 3 years from ages 30 years through ages 65 to 70 years with a history of 3 consecutive normal Pap tests.  Fecal occult blood test (FOBT) of stool. / Every year beginning at age 50 years and continuing until age 75 years. You may not need to do this test if you get a colonoscopy every 10 years.  Flexible sigmoidoscopy or colonoscopy.** / Every 5 years for a flexible sigmoidoscopy or every 10 years for a colonoscopy beginning at age 50 years and continuing until age 75 years.  Hepatitis C blood test.** / For all people born from 1945 through 1965 and any individual with known risks for hepatitis C.  Skin self-exam. / Monthly.  Influenza vaccine. / Every year.  Tetanus, diphtheria, and acellular pertussis (Tdap/Td) vaccine.** / Consult your health care provider. Pregnant women should receive 1 dose of Tdap vaccine during each pregnancy. 1 dose of Td every 10 years.  Varicella vaccine.** / Consult your health care provider. Pregnant females who do not have evidence of immunity should receive the first dose after pregnancy.  Zoster vaccine.** / 1 dose for adults aged 60 years or older.  Measles, mumps, rubella (MMR) vaccine.** / You need at least 1 dose of MMR if you were born in 1957 or later. You may also need a 2nd dose. For females of childbearing age, rubella immunity should be determined. If there is no evidence of immunity, females who are not pregnant should be vaccinated. If there is no evidence of immunity, females who are pregnant should delay immunization until after pregnancy.  Pneumococcal 13-valent conjugate (PCV13) vaccine.** / Consult your health care provider.  Pneumococcal polysaccharide (PPSV23) vaccine.** / 1 to 2 doses if  you smoke cigarettes or if you have certain conditions.  Meningococcal vaccine.** / Consult your health care provider.  Hepatitis A vaccine.** / Consult your health care provider.  Hepatitis B vaccine.** / Consult your health care provider.  Haemophilus influenzae type b (Hib) vaccine.** / Consult your health care provider.  Ages 65 years and over  Blood pressure check.** / Every 1 to 2 years.  Lipid and cholesterol check.** / Every 5 years beginning at age 20 years.  Lung cancer screening. / Every year if you are aged 55 80 years and have a 30-pack-year history of smoking and currently smoke or have quit within the past 15 years. Yearly screening is stopped once you have quit smoking for at least 15 years or develop a health problem that   would prevent you from having lung cancer treatment.  Clinical breast exam.** / Every year after age 103 years.  BRCA-related cancer risk assessment.** / For women who have family members with a BRCA-related cancer (breast, ovarian, tubal, or peritoneal cancers).  Mammogram.** / Every year beginning at age 36 years and continuing for as long as you are in good health. Consult with your health care provider.  Pap test.** / Every 3 years starting at age 5 years through age 85 or 10 years with 3 consecutive normal Pap tests. Testing can be stopped between 65 and 70 years with 3 consecutive normal Pap tests and no abnormal Pap or HPV tests in the past 10 years.  HPV screening.** / Every 3 years from ages 93 years through ages 70 or 45 years with a history of 3 consecutive normal Pap tests. Testing can be stopped between 65 and 70 years with 3 consecutive normal Pap tests and no abnormal Pap or HPV tests in the past 10 years.  Fecal occult blood test (FOBT) of stool. / Every year beginning at age 8 years and continuing until age 45 years. You may not need to do this test if you get a colonoscopy every 10 years.  Flexible sigmoidoscopy or colonoscopy.** /  Every 5 years for a flexible sigmoidoscopy or every 10 years for a colonoscopy beginning at age 69 years and continuing until age 68 years.  Hepatitis C blood test.** / For all people born from [birth year] through 1964 and any individual with known risks for hepatitis C.  Osteoporosis screening.** / A one-time screening for women ages 7 years and over and women at risk for fractures or osteoporosis.  Skin self-exam. / Monthly.  Influenza vaccine. / Every year.  Tetanus, diphtheria, and acellular pertussis (Tdap/Td) vaccine.** / 1 dose of Td every 10 years.  Varicella vaccine.** / Consult your health care provider.  Zoster vaccine.** / 1 dose for adults aged 5 years or older.  Pneumococcal 13-valent conjugate (PCV13) vaccine.** / Consult your health care provider.  Pneumococcal polysaccharide (PPSV23) vaccine.** / 1 dose for all adults aged 74 years and older.  Meningococcal vaccine.** / Consult your health care provider.  Hepatitis A vaccine.** / Consult your health care provider.  Hepatitis B vaccine.** / Consult your health care provider.  Haemophilus influenzae type b (Hib) vaccine.** / Consult your health care provider. ** Family history and personal history of risk and conditions may change your health care provider's recommendations. Document Released: 05/11/2001 Document Revised: 01/03/2013  [facility] Patient Information 2014 [place], [place].   EXERCISE AND DIET:  We recommended that you start or continue a regular exercise program for good health. Regular exercise means any activity that makes your heart beat faster and makes you sweat.  We recommend exercising at least 30 minutes per day at least 3 days a week, preferably 5.  We also recommend a diet low in fat and sugar / carbohydrates.  Inactivity, poor dietary choices and obesity can cause diabetes, heart attack, stroke, and kidney damage, among others.     ALCOHOL AND SMOKING:  Women should limit their alcohol intake to no  more than 7 drinks/beers/glasses of wine (combined, not each!) per week. Moderation of alcohol intake to this level decreases your risk of breast cancer and liver damage.  ( And of course, no recreational drugs are part of a healthy lifestyle.)  Also, you should not be smoking at all or even being exposed to second hand smoke. Most people know smoking can  cause cancer, and various heart and lung diseases, but did you know it also contributes to weakening of your bones?  Aging of your skin?  Yellowing of your teeth and nails?   CALCIUM AND VITAMIN D:  Adequate intake of calcium and Vitamin D are recommended.  The recommendations for exact amounts of these supplements seem to change often, but generally speaking 600 mg of calcium (either carbonate or citrate) and 800 units of Vitamin D per day seems prudent. Certain women may benefit from higher intake of Vitamin D.  If you are among these women, your doctor will have told you during your visit.     PAP SMEARS:  Pap smears, to check for cervical cancer or precancers,  have traditionally been done yearly, although recent scientific advances have shown that most women can have pap smears less often.  However, every woman still should have a physical exam from her gynecologist or primary care physician every year. It will include a breast check, inspection of the vulva and vagina to check for abnormal growths or skin changes, a visual exam of the cervix, and then an exam to evaluate the size and shape of the uterus and ovaries.  And after 26 years of age, a rectal exam is indicated to check for rectal cancers. We will also provide age appropriate advice regarding health maintenance, like when you should have certain vaccines, screening for sexually transmitted diseases, bone density testing, colonoscopy, mammograms, etc.    MAMMOGRAMS:  All women over 65 years old should have a yearly mammogram. Many facilities now offer a "3D" mammogram, which may cost  around $50 extra out of pocket. If possible,  we recommend you accept the option to have the 3D mammogram performed.  It both reduces the number of women who will be called back for extra views which then turn out to be normal, and it is better than the routine mammogram at detecting truly abnormal areas.     COLONOSCOPY:  Colonoscopy to screen for colon cancer is recommended for all women at age 26.  We know, you hate the idea of the prep.  We agree, BUT, having colon cancer and not knowing it is worse!!  Colon cancer so often starts as a polyp that can be seen and removed at colonscopy, which can quite literally save your life!  And if your first colonoscopy is normal and you have no family history of colon cancer, most women don't have to have it again for 10 years.  Once every ten years, you can do something that may end up saving your life, right?  We will be happy to help you get it scheduled when you are ready.  Be sure to check your insurance coverage so you understand how much it will cost.  It may be covered as a preventative service at no cost, but you should check your particular policy.    Mediterranean Diet A Mediterranean diet refers to food and lifestyle choices that are based on the traditions of countries located on the [employer]. This way of eating has been shown to help prevent certain conditions and improve outcomes for people who have chronic diseases, like kidney disease and heart disease. What are tips for following this plan? Lifestyle  Cook and eat meals together with your family, when possible.  Drink enough fluid to keep your urine clear or pale yellow.  Be physically active every day. This includes: ? Aerobic exercise like running or swimming. ? Leisure activities like  gardening, walking, or housework.  Get 7-8 hours of sleep each night.  If recommended by your health care provider, drink red wine in moderation. This means 1 glass a day for nonpregnant women  and 2 glasses a day for men. A glass of wine equals 5 oz (150 mL). Reading food labels  Check the serving size of packaged foods. For foods such as rice and pasta, the serving size refers to the amount of cooked product, not dry.  Check the total fat in packaged foods. Avoid foods that have saturated fat or trans fats.  Check the ingredients list for added sugars, such as corn syrup. Shopping  At the grocery store, buy most of your food from the areas near the walls of the store. This includes: ? Fresh fruits and vegetables (produce). ? Grains, beans, nuts, and seeds. Some of these may be available in unpackaged forms or large amounts (in bulk). ? Fresh seafood. ? Poultry and eggs. ? Low-fat dairy products.  Buy whole ingredients instead of prepackaged foods.  Buy fresh fruits and vegetables in-season from local farmers markets.  Buy frozen fruits and vegetables in resealable bags.  If you do not have access to quality fresh seafood, buy precooked frozen shrimp or canned fish, such as tuna, salmon, or sardines.  Buy small amounts of raw or cooked vegetables, salads, or olives from the deli or salad bar at your store.  Stock your pantry so you always have certain foods on hand, such as olive oil, canned tuna, canned tomatoes, rice, pasta, and beans. Cooking  Cook foods with extra-virgin olive oil instead of using butter or other vegetable oils.  Have meat as a side dish, and have vegetables or grains as your main dish. This means having meat in small portions or adding small amounts of meat to foods like pasta or stew.  Use beans or vegetables instead of meat in common dishes like chili or lasagna.  Experiment with different cooking methods. Try roasting or broiling vegetables instead of steaming or sauteing them.  Add frozen vegetables to soups, stews, pasta, or rice.  Add nuts or seeds for added healthy fat at each meal. You can add these to yogurt, salads, or vegetable  dishes.  Marinate fish or vegetables using olive oil, lemon juice, garlic, and fresh herbs. Meal planning  Plan to eat 1 vegetarian meal one day each week. Try to work up to 2 vegetarian meals, if possible.  Eat seafood 2 or more times a week.  Have healthy snacks readily available, such as: ? Vegetable sticks with hummus. ? [place] yogurt. ? Fruit and nut trail mix.  Eat balanced meals throughout the week. This includes: ? Fruit: 2-3 servings a day ? Vegetables: 4-5 servings a day ? Low-fat dairy: 2 servings a day ? Fish, poultry, or lean meat: 1 serving a day ? Beans and legumes: 2 or more servings a week ? Nuts and seeds: 1-2 servings a day ? Whole grains: 6-8 servings a day ? Extra-virgin olive oil: 3-4 servings a day  Limit red meat and sweets to only a few servings a month What are my food choices?  Mediterranean diet ? Recommended ? Grains: Whole-grain pasta. Brown rice. Bulgar wheat. Polenta. Couscous. Whole-wheat bread. [place]. ? Vegetables: Artichokes. Beets. Broccoli. Cabbage. Carrots. Eggplant. Green beans. Chard. Kale. Spinach. Onions. Leeks. Peas. Squash. Tomatoes. Peppers. Radishes. ? Fruits: Apples. Apricots. Avocado. Berries. Bananas. Cherries. Dates. Figs. Grapes. Lemons. Melon. Oranges. Peaches. Plums. Pomegranate. ? Meats and other protein  foods: Beans. Almonds. Sunflower seeds. Pine nuts. Peanuts. [place]. Salmon. Scallops. Shrimp. [place]. Tilapia. Clams. Oysters. Eggs. ? Dairy: Low-fat milk. Cheese. Greek yogurt. ? Beverages: Water. Red wine. Herbal tea. ? Fats and oils: Extra virgin olive oil. Avocado oil. Grape seed oil. ? Sweets and desserts: [place] yogurt with honey. Baked apples. Poached pears. Trail mix. ? Seasoning and other foods: Basil. Cilantro. Coriander. Cumin. Mint. Parsley. Sage. Rosemary. Tarragon. Garlic. Oregano. Thyme. Pepper. Balsalmic vinegar. Tahini. Hummus. Tomato sauce. Olives. Mushrooms. ? Limit these ? Grains: Prepackaged pasta or  rice dishes. Prepackaged cereal with added sugar. ? Vegetables: Deep fried potatoes (french fries). ? Fruits: Fruit canned in syrup. ? Meats and other protein foods: Beef. Pork. Lamb. Poultry with skin. Hot dogs. [place] Salines. ? Dairy: Ice cream. Sour cream. Whole milk. ? Beverages: Juice. Sugar-sweetened soft drinks. Beer. Liquor and spirits. ? Fats and oils: Butter. Canola oil. Vegetable oil. Beef fat (tallow). Lard. ? Sweets and desserts: Cookies. Cakes. Pies. Candy. ? Seasoning and other foods: Mayonnaise. Premade sauces and marinades. ? The items listed may not be a complete list. Talk with your dietitian about what dietary choices are right for you. Summary  The Mediterranean diet includes both food and lifestyle choices.  Eat a variety of fresh fruits and vegetables, beans, nuts, seeds, and whole grains.  Limit the amount of red meat and sweets that you eat.  Talk with your health care provider about whether it is safe for you to drink red wine in moderation. This means 1 glass a day for nonpregnant women and 2 glasses a day for men. A glass of wine equals 5 oz (150 mL). This information is not intended to replace advice given to you by your health care provider. Make sure you discuss any questions you have with your health care provider. Document Released: 11/06/2015 Document Revised: 12/09/2015 Document Reviewed: 11/06/2015 Elsevier Interactive Patient Education  2018 [employer].   Exercising to [employer] Exercising can help you to lose weight. In order to lose weight through exercise, you need to do vigorous-intensity exercise. You can tell that you are exercising with vigorous intensity if you are breathing very hard and fast and cannot hold a conversation while exercising. Moderate-intensity exercise helps to maintain your current weight. You can tell that you are exercising at a moderate level if you have a higher heart rate and faster breathing, but you are still able to hold a  conversation. How often should I exercise? Choose an activity that you enjoy and set realistic goals. Your health care provider can help you to make an activity plan that works for you. Exercise regularly as directed by your health care provider. This may include:  Doing resistance training twice each week, such as: ? Push-ups. ? Sit-ups. ? Lifting weights. ? Using resistance bands.  Doing a given intensity of exercise for a given amount of time. Choose from these options: ? 150 minutes of moderate-intensity exercise every week. ? 75 minutes of vigorous-intensity exercise every week. ? A mix of moderate-intensity and vigorous-intensity exercise every week.  Children, pregnant women, people who are out of shape, people who are overweight, and older adults may need to consult a health care provider for individual recommendations. If you have any sort of medical condition, be sure to consult your health care provider before starting a new exercise program. What are some activities that can help me to lose weight?  Walking at a rate of at least 4.5 miles an hour.  Jogging or  running at a rate of 5 miles per hour.  Biking at a rate of at least 10 miles per hour.  Lap swimming.  Roller-skating or in-line skating.  Cross-country skiing.  Vigorous competitive sports, such as football, basketball, and soccer.  Jumping rope.  Aerobic dancing. How can I be more active in my day-to-day activities?  Use the stairs instead of the elevator.  Take a walk during your lunch break.  If you drive, park your car farther away from work or school.  If you take public transportation, get off one stop early and walk the rest of the way.  Make all of your phone calls while standing up and walking around.  Get up, stretch, and walk around every 30 minutes throughout the day. What guidelines should I follow while exercising?  Do not exercise so much that you hurt yourself, feel dizzy, or get  very short of breath.  Consult your health care provider prior to starting a new exercise program.  Wear comfortable clothes and shoes with good support.  Drink plenty of water while you exercise to prevent dehydration or heat stroke. Body water is lost during exercise and must be replaced.  Work out until you breathe faster and your heart beats faster. This information is not intended to replace advice given to you by your health care provider. Make sure you discuss any questions you have with your health care provider. Document Released: 04/17/2010 Document Revised: 08/21/2015 Document Reviewed: 08/16/2013 Elsevier Interactive Patient Education  2018 [employer].  Sertraline dosage increased from '50mg'$  to '100mg'$  once daily. Continue all other medications as directed. Increase water intake, strive for at least 100 ounces/day.   Follow Heart Healthy diet Increase regular exercise.  Recommend at least 30 minutes daily, 5 days per week of walking, jogging, biking, swimming, YouTube/Pinterest workout videos. Referral to OB/GYN placed. Recommend that you follow-up with Neurologist, re: migraine headache treatment. We will call you when lab and PAP results are available. Recommend follow-up in 6 months. NICE TO SEE YOU!

## 2017-09-14 NOTE — Telephone Encounter (Signed)
Called and left a message for patient to call back to schedule a new patient doctor referral appointment with our office. °

## 2017-09-14 NOTE — Assessment & Plan Note (Signed)
Denies thoughts of harming herself/others Depression sx's only minimally controlled [facility] 09/14/2017 06/23/2017 04/11/2017  Decreased Interest 2 0 0  Down, Depressed, Hopeless 3 2 2   PHQ - 2 Score 5 2 2   Altered sleeping 3 3 3   Tired, decreased energy 2 2 3   Change in appetite 2 3 3   Feeling bad or failure about yourself  3 3 3   Trouble concentrating 1 0 1  Moving slowly or fidgety/restless 0 0 0  Suicidal thoughts 0 0 0  PHQ-9 Score 16 13 15   Difficult doing work/chores Somewhat difficult Somewhat difficult Not difficult at all   Increased Sertraline from 50mg  to 100mg  QD

## 2017-09-14 NOTE — Assessment & Plan Note (Signed)
She denies aura with Migraine Advised to reduce OTC Acetaminophen use to treat HA sx's- elevated LFTs Recommend that you follow-up with Neurologist, re: migraine headache treatment.

## 2017-09-15 LAB — HEPATIC FUNCTION PANEL
ALK PHOS: 104 IU/L (ref 39–117)
ALT: 318 IU/L — ABNORMAL HIGH (ref 0–32)
AST: 274 IU/L — AB (ref 0–40)
Albumin: 3.8 g/dL (ref 3.5–5.5)
Bilirubin Total: 0.4 mg/dL (ref 0.0–1.2)
Bilirubin, Direct: 0.11 mg/dL (ref 0.00–0.40)
Total Protein: 6.8 g/dL (ref 6.0–8.5)

## 2017-09-18 LAB — CYTOLOGY - PAP
Diagnosis: UNDETERMINED — AB
HPV: DETECTED — AB

## 2017-09-19 ENCOUNTER — Other Ambulatory Visit: Payer: Self-pay | Admitting: Adult Health

## 2017-09-19 DIAGNOSIS — R945 Abnormal results of liver function studies: Principal | ICD-10-CM

## 2017-09-19 DIAGNOSIS — R7989 Other specified abnormal findings of blood chemistry: Secondary | ICD-10-CM

## 2017-09-19 NOTE — Progress Notes (Signed)
LFts remain elevated- US ordered and referral to GI placed PAP- abnormal with + HPV, advised that she f/u with her established GI

## 2017-09-30 ENCOUNTER — Ambulatory Visit
Admission: RE | Admit: 2017-09-30 | Discharge: 2017-09-30 | Disposition: A | Discharge status: Home / Self Care | Payer: 59 | Source: Ambulatory Visit | Attending: Adult Health | Admitting: Adult Health

## 2017-09-30 DIAGNOSIS — R945 Abnormal results of liver function studies: Principal | ICD-10-CM

## 2017-09-30 DIAGNOSIS — R7989 Other specified abnormal findings of blood chemistry: Secondary | ICD-10-CM | POA: Diagnosis not present

## 2017-10-03 ENCOUNTER — Other Ambulatory Visit: Payer: Self-pay | Admitting: Adult Health

## 2017-10-03 DIAGNOSIS — R945 Abnormal results of liver function studies: Principal | ICD-10-CM

## 2017-10-03 DIAGNOSIS — R7989 Other specified abnormal findings of blood chemistry: Secondary | ICD-10-CM

## 2017-10-06 ENCOUNTER — Ambulatory Visit (INDEPENDENT_AMBULATORY_CARE_PROVIDER_SITE_OTHER): Payer: 59 | Admitting: Internal Medicine

## 2017-10-06 ENCOUNTER — Encounter: Payer: Self-pay | Admitting: Internal Medicine

## 2017-10-06 ENCOUNTER — Other Ambulatory Visit (INDEPENDENT_AMBULATORY_CARE_PROVIDER_SITE_OTHER): Payer: 59

## 2017-10-06 VITALS — BP 134/70 | HR 64 | Ht 63.0 in | Wt 298.0 lb

## 2017-10-06 DIAGNOSIS — R945 Abnormal results of liver function studies: Secondary | ICD-10-CM | POA: Diagnosis not present

## 2017-10-06 DIAGNOSIS — R7989 Other specified abnormal findings of blood chemistry: Secondary | ICD-10-CM

## 2017-10-06 LAB — IBC PANEL
IRON: 93 ug/dL (ref 42–145)
Saturation Ratios: 20.6 % (ref 20.0–50.0)
TRANSFERRIN: 322 mg/dL (ref 212.0–360.0)

## 2017-10-06 LAB — HEPATIC FUNCTION PANEL
ALT: 277 U/L — AB (ref 0–35)
AST: 225 U/L — ABNORMAL HIGH (ref 0–37)
Albumin: 4 g/dL (ref 3.5–5.2)
Alkaline Phosphatase: 96 U/L (ref 39–117)
BILIRUBIN DIRECT: 0.2 mg/dL (ref 0.0–0.3)
BILIRUBIN TOTAL: 0.5 mg/dL (ref 0.2–1.2)
Total Protein: 7.6 g/dL (ref 6.0–8.3)

## 2017-10-06 LAB — PROTIME-INR
INR: 1.1 ratio — AB (ref 0.8–1.0)
Prothrombin Time: 12.6 s (ref 9.6–13.1)

## 2017-10-06 LAB — FERRITIN: Ferritin: 236.5 ng/mL (ref 10.0–291.0)

## 2017-10-06 LAB — IRON: Iron: 93 ug/dL (ref 42–145)

## 2017-10-06 NOTE — Patient Instructions (Signed)
Your provider has requested that you go to the basement level for lab work before leaving today. Press "B" on the elevator. The lab is located at the first door on the left as you exit the elevator.  

## 2017-10-06 NOTE — Progress Notes (Signed)
HISTORY OF PRESENT ILLNESS:  Lisa Logan is a 27 y.o. 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 The patient had normal hepatic transaminases 3 and 2 years ago. No blood work since. Saw her primary care provider in June for routine evaluation. Was feeling well at that time. Routine blood work revealed elevated transaminases with AST 229 and ALT 294. Other liver tests were normal as was albumin and protein. Unremarkable CBC with normal MCV and platelets. Patient had follow-up liver tests 2 weeks later. These were essentially the same. Referral made. Patient denies a personal or family history of liver disease. Her alcohol consumption is minimal. Occasional Tylenol. No additional nonprescription agents. She was on no regular medications aside from birth control pill at time of her blood work. She has been on this for many years. She denies blood transfusion. She does have 2 tattoos one of which is not professional. She denies myalgias. She does report occasional rectal discomfort with hard bowel movement and bleeding. She will have discomfort and bleeding thereafter 4 week or so. No problems recently. She also has occasional loose stools which she attributes to "lactose intolerance". She has been quite obese for some time. Her weight 5 years ago was 240 pounds. Current weight is 300 pounds. Recently placed on Zoloft. No fevers or other complaints. No myalgias. Abdominal ultrasound was performed 09/30/2017. Exam was said to be suboptimal due to body habitus. Liver was said to show no focal lesion with normal parenchymal echogenicity. Additionally, she did have a CT scan of the abdomen and pelvis with contrast January 2019 after blunt abdominal trauma. The liver was unremarkable.Marland Kitchen  REVIEW OF SYSTEMS:  All non-GI ROS negative  unless stated in the history of present illness except for anxiety, headaches  Past Medical History:  Diagnosis Date  . Arm fracture   . C. difficile colitis   . Depression   . HPV in female   . Migraine   . Obesity   . Vitamin D deficiency     Past Surgical History:  Procedure Laterality Date  . NO PAST SURGERIES      Social History Lisa Logan  reports that she has never smoked. She has never used smokeless tobacco. She reports that she drinks alcohol. She reports that she does not use drugs.  family history includes Alcohol abuse in her father; Arthritis in her maternal grandmother and mother; Cancer in her maternal aunt, maternal grandfather, maternal grandmother, maternal uncle, mother, and paternal grandmother; Depression in her brother, father, maternal aunt, maternal uncle, mother, and sister; Hypertension in her father; Mental illness in her father, maternal grandfather, maternal grandmother, and mother; Migraines in her mother; Skin cancer in her maternal grandmother; Stroke in her maternal grandmother.  Allergies  Allergen Reactions  . Culturelle [Lactobacillus] Rash  . Penicillins Rash       PHYSICAL EXAMINATION: Vital signs: BP 134/70   Pulse 64   Ht 5\' 3"  (1.6 m)   Wt 298 lb (135.2 kg)   BMI 52.79 kg/m   Constitutional: pleasant, obese,generally well-appearing, no acute distress Psychiatric: alert and oriented x3, cooperative Eyes: extraocular movements intact, anicteric, conjunctiva pink Mouth: oral pharynx moist, no lesions Neck: supple no lymphadenopathy Cardiovascular: heart regular rate and rhythm, no murmur Lungs: clear to auscultation bilaterally Abdomen: soft,obese, nontender, nondistended, no obvious ascites, no peritoneal signs, normal bowel  sounds, no organomegaly Rectal:omitted Extremities: no clubbing, cyanosis, or lower extremity edema bilaterally Skin: tattoos.no additional lesions on visible extremities Neuro: No focal deficits. No  asterixis.  ASSESSMENT:  #1. Elevated hepatic transaminases. Rule out viral hepatitis. Rule out fatty liver/Nash.Rule out other #2. Morbid obesity. Concerning   PLAN:  #1. Acute and chronic viral studies #2. Multiple nonviral studies including iron studies #3. Exercise and weight loss #4. Office follow-up in 4-6 weeks  A copy of this consultation note has been sent to Lisa Logan

## 2017-10-07 ENCOUNTER — Ambulatory Visit: Payer: 59 | Admitting: Obstetrics and Gynecology

## 2017-10-07 ENCOUNTER — Encounter: Payer: Self-pay | Admitting: Obstetrics and Gynecology

## 2017-10-07 ENCOUNTER — Other Ambulatory Visit: Payer: Self-pay

## 2017-10-07 VITALS — BP 120/84 | HR 84 | Resp 14 | Ht 64.0 in | Wt 300.0 lb

## 2017-10-07 DIAGNOSIS — N941 Unspecified dyspareunia: Secondary | ICD-10-CM

## 2017-10-07 DIAGNOSIS — A630 Anogenital (venereal) warts: Secondary | ICD-10-CM

## 2017-10-07 DIAGNOSIS — Z0000 Encounter for general adult medical examination without abnormal findings: Secondary | ICD-10-CM

## 2017-10-07 DIAGNOSIS — R87610 Atypical squamous cells of undetermined significance on cytologic smear of cervix (ASC-US): Secondary | ICD-10-CM

## 2017-10-07 DIAGNOSIS — G43109 Migraine with aura, not intractable, without status migrainosus: Secondary | ICD-10-CM

## 2017-10-07 DIAGNOSIS — Z3009 Encounter for other general counseling and advice on contraception: Secondary | ICD-10-CM

## 2017-10-07 LAB — HEPATITIS PANEL, ACUTE
HEP B C IGM: NEGATIVE
Hep A IgM: NEGATIVE
Hepatitis B Surface Ag: NEGATIVE

## 2017-10-07 MED ORDER — LIDOCAINE 5 % EX OINT: TOPICAL_OINTMENT | CUTANEOUS | 0 refills | Status: DC

## 2017-10-07 NOTE — Progress Notes (Signed)
27 y.o. G0P0000 MarriedCaucasianF here for new patient consult from William HamburgerKaty Danford, NP for evaluation of an abnormal pap. Recent pap returned with ASCUS, +HPV.   She was told she had HPV on her pap when she was 19 and 20. No pap since then. No colposcopy.  Not currently sexually active since January, marital issues.  She does have issues with pain on entry with intercourse. If she uses lubricant and he enters slowly, that helps.   On continuous OCP's, no cycles. 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 She was on the mini-pill for a few months, migraines worsened and she had nausea.    No LMP recorded. (Menstrual status: Oral contraceptives).          Sexually active: Yes.    The current method of family planning is OCP (estrogen/progesterone).    Exercising: Yes.    swimming Smoker:  no  Health Maintenance: Pap:  09-14-17 ASCUS, HR HPV positive  History of abnormal Pap:  yes TDaP:  03/29/09 Gardasil: completed x3    reports that she has never smoked. She has never used smokeless tobacco. She reports that she drinks alcohol. She reports that she does not use drugs. Not currently drinking ETOH secondary to elevated LFT's. She is a Futures traderhomemaker.  Past Medical History:  Diagnosis Date  . Anxiety   . Arm fracture   . C. difficile colitis   . Depression   . HPV in female   . Migraine   . Obesity   . Vitamin D deficiency     Past Surgical History:  Procedure Laterality Date  . NO PAST SURGERIES      Current Outpatient Medications  Medication Sig Dispense Refill  . cyclobenzaprine (FLEXERIL) 5 MG tablet Take 1 tablet (5 mg total) by mouth at bedtime as needed for muscle spasms. 15 tablet 0  . dicyclomine (BENTYL) 10 MG capsule Take 1 capsule (10 mg total) by mouth 2 (two) times daily as needed for spasms. 60 capsule 0  . LORazepam (ATIVAN) 1 MG tablet 1 tablet by mouth every 8 hrs as needed for acute anxiety 30 tablet 0  . norethindrone-ethinyl estradiol 1/35 (ORTHO-NOVUM 1/35, 28,)  tablet Take 1 tablet by mouth daily. 1 Package 11  . ondansetron (ZOFRAN-ODT) 8 MG disintegrating tablet Take 1 tablet (8 mg total) by mouth every 8 (eight) hours as needed for nausea or vomiting. 20 tablet 1  . sertraline (ZOLOFT) 100 MG tablet Take 1 tablet (100 mg total) by mouth daily. 90 tablet 1   No current facility-administered medications for this visit.   The patient had a complicated migraine syndrome at 18, had stroke like symptoms. Since then she gets occasional auras. Her Neurologist is aware she is on OCP's  Family History  Problem Relation Age of Onset  . Arthritis Mother   . Mental illness Mother   . Cancer Mother        skin   . Migraines Mother   . Depression Mother   . Alcohol abuse Father   . Hypertension Father   . Mental illness Father   . Depression Father   . Cancer Maternal Aunt        Bone  . Depression Maternal Aunt   . Lung cancer Maternal Aunt   . Arthritis Maternal Grandmother   . Stroke Maternal Grandmother   . Mental illness Maternal Grandmother   . Skin cancer Maternal Grandmother   . Cancer Maternal Grandmother        skin  .  Cancer Maternal Grandfather        lung  . Mental illness Maternal Grandfather   . Cancer Paternal Grandmother        Skin  . Depression Sister   . Depression Brother   . Cancer Maternal Uncle        skin  . Depression Maternal Uncle     Review of Systems  Constitutional: Negative.   HENT: Negative.   Eyes: Negative.   Respiratory: Negative.   Cardiovascular: Negative.   Gastrointestinal: Negative.   Endocrine: Negative.   Genitourinary: Negative.   Musculoskeletal: Negative.   Skin: Negative.   Allergic/Immunologic: Negative.   Neurological: Negative.   Hematological: Negative.   Psychiatric/Behavioral: Negative.     Exam:   BP 120/84 (BP Location: Right Arm, Patient Position: Sitting, Cuff Size: Large)   Pulse 84   Resp 14   Ht 5\' 4"  (1.626 m)   Wt 300 lb (136.1 kg)   BMI 51.49 kg/m   Weight  change: @WEIGHTCHANGE @ Height:   Height: 5\' 4"  (162.6 cm)  Ht Readings from Last 3 Encounters:  10/07/17 5\' 4"  (1.626 m)  10/06/17 5\' 3"  (1.6 m)  09/14/17 5' 2.01" (1.575 m)    General appearance: alert, cooperative and appears stated age  A:  ASCUS, +HPV pap  Contraception, on continuous OCP's, has migraines with aura  Dyspareunia (not currently sexually active)  She also reports difficulty with pelvic exam  P:   Return for colposcopy, discussed abnormal pap and colposcopy  Will have her use lidocaine ointment at the introitus prior to her exam and she can use it prior to intercourse.   I discussed my concern of her being on OCP's with a h/o aura's. She reports hospitalization at 62 with stroke like symptoms from her migraines. The CDC and WHO consider migraines with aura a contraindication to OCP use. I would recommend another form of contraception. Information on the mirena IUD was given (she needs contraception, but had heavy, crampy cycles prior to OCP's). I told her to discuss this with her Neurologist and Primary.   CC: William Hamburger, NP, Naomie Dean, MD Note sent  ~25 minutes was spent face to face with the patient, over 75% in counseling

## 2017-10-07 NOTE — Patient Instructions (Signed)
Colposcopy  Colposcopy is a procedure to examine the lowest part of the uterus (cervix), the vagina, and the area around the vaginal opening (vulva) for abnormalities or signs of disease. The procedure is done using a lighted microscope or magnifying lens (colposcope). If any unusual cells are found during the procedure, your health care provider may remove a tissue sample for testing (biopsy). A colposcopy may be done if you:   Have an abnormal Pap test. A Pap test is a screening test that is used to check for signs of cancer or infection of the vagina, cervix, and uterus.   Have a Pap smear test in which you test positive for high-risk HPV (human papillomavirus).   Have a sore or lesion on your cervix.   Have genital warts on your vulva, vagina, or cervix.   Took certain medicines while pregnant, such as diethylstilbestrol (DES).   Have pain during sexual intercourse.   Have vaginal bleeding, especially after sexual intercourse.   Need to have a cervical polyp removed.   Need to have a lost intrauterine device (IUD) string located.    Let your health care provider know about:   Any allergies you have, including allergies to prescribed medicine, latex, or iodine.   All medicines you are taking, including vitamins, herbs, eye drops, creams, and over-the-counter medicines. Bring a list of all of your medicines to your appointment.   Any problems you or family members have had with anesthetic medicines.   Any blood disorders you have.   Any surgeries you have had.   Any medical conditions you have, such as pelvic inflammatory disease (PID) or endometrial disorder.   Any history of frequent fainting.   Your menstrual cycle and what form of birth control (contraception) you use.   Your medical history, including any prior cervical treatment.   Whether you are pregnant or may be pregnant.  What are the risks?  Generally, this is a safe procedure. However, problems may occur,  including:   Pain.   Infection, which may include a fever, bad-smelling discharge, or pelvic pain.   Bleeding or discharge.   Misdiagnosis.   Fainting and vasovagal reactions, but this is rare.   Allergic reactions to medicines.   Damage to other structures or organs.    What happens before the procedure?   If you have your menstrual period or will have it at the time of your procedure, tell your health care provider. A colposcopy typically is not done during menstruation.   Continue your contraceptive practices before and after the procedure.   For 24 hours before the colposcopy:  ? Do not douche.  ? Do not use tampons.  ? Do not use medicines, creams, or suppositories in the vagina.  ? Do not have sexual intercourse.   Ask your health care provider about:  ? Changing or stopping your regular medicines. This is especially important if you are taking diabetes medicines or blood thinners.  ? Taking medicines such as aspirin and ibuprofen. These medicines can thin your blood. Do not take these medicines before your procedure if your health care provider instructs you not to. It is likely that your health care provider will tell you to avoid taking aspirin or medicine that contains aspirin for 7 days before the procedure.   Follow instructions from your health care provider about eating or drinking restrictions. You will likely need to eat a regular diet the day of the procedure and not skip any meals.   You   may have an exam or testing. A pregnancy test will be taken on the day of the procedure.   You may have a blood or urine sample taken.   Plan to have someone take you home from the hospital or clinic.   If you will be going home right after the procedure, plan to have someone with you for 24 hours.  What happens during the procedure?   You will lie down on your back, with your feet in foot rests (stirrups).   A warmed and lubricated instrument (speculum) will be inserted into your vagina. The  speculum will be used to hold apart the walls of your vagina so your health care provider can see your cervix and the inside of your vagina.   A cotton swab will be used to place a small amount of liquid solution on the areas to be examined. This solution makes it easier to see abnormal cells. You may feel a slight burning during this part.   The colposcope will be used to scan the cervix with a bright white light. The colposcope will be held near your vulvaand will magnify your vulva, vagina, and cervix for easier examination.   Your health care provider may decide to take a biopsy. If so:  ? You may be given medicine to numb the area (local anesthetic).  ? Surgical instruments will be used to suck out mucus and cells through your vagina.  ? You may feel mild pain while the tissue sample is removed.  ? Bleeding may occur. A solution may be used to stop the bleeding.  ? If a sample of tissue is needed from the inside of the cervix, a different procedure called endocervical curettage (ECC) may be completed. During this procedure, a curved instrument (curette) will be used to scrape cells from your cervix or the top of your cervix (endocervix).   Your health care provider will record the location of any abnormalities.  The procedure may vary among health care providers and hospitals.  What happens after the procedure?   You will lie down and rest for a few minutes. You may be offered juice or cookies.   Your blood pressure, heart rate, breathing rate, and blood oxygen level will be monitored until any medicines you were given have worn off.   You may have to wear compression stockings. These stockings help to prevent blood clots and reduce swelling in your legs.   You may have some cramping in your abdomen. This should go away after a few minutes.  This information is not intended to replace advice given to you by your health care provider. Make sure you discuss any questions you have with your health care  provider.  Document Released: 06/05/2002 Document Revised: 11/11/2015 Document Reviewed: 10/20/2015  Elsevier Interactive Patient Education  2018 [employer]

## 2017-10-10 ENCOUNTER — Other Ambulatory Visit: Payer: 59

## 2017-10-10 LAB — HEPATITIS A ANTIBODY, TOTAL: HEPATITIS A AB,TOTAL: NONREACTIVE

## 2017-10-10 LAB — CERULOPLASMIN: CERULOPLASMIN: 58 mg/dL — AB (ref 18–53)

## 2017-10-10 LAB — HEPATITIS C ANTIBODY
Hepatitis C Ab: NONREACTIVE
SIGNAL TO CUT-OFF: 0.03 (ref <1.00)

## 2017-10-10 LAB — MITOCHONDRIAL ANTIBODIES

## 2017-10-10 LAB — ANA: ANA: NEGATIVE

## 2017-10-10 LAB — TISSUE TRANSGLUTAMINASE, IGA: (TTG) AB, IGA: 1 U/mL

## 2017-10-10 LAB — HEPATITIS B CORE ANTIBODY, IGM: Hep B C IgM: NONREACTIVE

## 2017-10-10 LAB — HEPATITIS B CORE ANTIBODY, TOTAL: HEP B C TOTAL AB: NONREACTIVE

## 2017-10-10 LAB — ANTI-SMOOTH MUSCLE ANTIBODY, IGG: Actin (Smooth Muscle) Antibody (IGG): <20 U (ref <20)

## 2017-10-10 LAB — HEPATITIS B SURFACE ANTIBODY,QUALITATIVE: Hep B S Ab: NONREACTIVE

## 2017-10-10 LAB — ALPHA-1-ANTITRYPSIN: A1 ANTITRYPSIN SER: 284 mg/dL — AB (ref 83–199)

## 2017-10-10 LAB — HEPATITIS B SURFACE ANTIGEN: Hepatitis B Surface Ag: NONREACTIVE

## 2017-10-12 ENCOUNTER — Telehealth: Payer: Self-pay | Admitting: Obstetrics & Gynecology

## 2017-10-12 ENCOUNTER — Telehealth: Payer: Self-pay | Admitting: Adult Health

## 2017-10-12 DIAGNOSIS — R87610 Atypical squamous cells of undetermined significance on cytologic smear of cervix (ASC-US): Secondary | ICD-10-CM

## 2017-10-12 DIAGNOSIS — A630 Anogenital (venereal) warts: Secondary | ICD-10-CM

## 2017-10-12 NOTE — Telephone Encounter (Signed)
Dr.Jertson is the provider chose Dr.Miller error.  Patient is ready to schedule her biopsy with Dr.Jertson.

## 2017-10-12 NOTE — Telephone Encounter (Signed)
Patient called states she had spoken to provider regarding the need for personal counseling but states now there is an urgency for marital counseling.  --forwarding request to medical assistant .  --Fausto Skillernglh

## 2017-10-13 NOTE — Telephone Encounter (Signed)
Spoke with patient, advised as seen below per Dr. Oscar LaJertson. Patient states she has decided she will bring ativan to office, will take after signing her consent. Will plan to bring driver with her day of appt.   Routing to provider for final review. Patient is agreeable to disposition. Will close encounter.

## 2017-10-13 NOTE — Telephone Encounter (Signed)
Good Morning Tonya, Can you please call Ms. Milby and share, these two counseling centers offer excellent marriage counseling services- 1) [facility] [address] [phone] 2) [facility] [address] [phone]  ALSO, I received a message from Dr. Chauncey FischerJertson/the GYN I sent her to for treatment of +HPV Dr. Oscar LaJertson recommends that she stop combo birth control pill due to hx of migraine with aura (this rx not prescibed from me). Recommended that she either use progesterone only pill or have IUD placed. Please ask her if she wants me to change her oral birth control or have her make appt with Dr. Oscar LaJertson for IUD.  Thanks! Orpha BurKaty

## 2017-10-13 NOTE — Telephone Encounter (Signed)
MyChart message sent to pt with counseling services information.  Pt states that she discussed with Dr. Oscar LaJertson in depth about both of these options.  However, pt does not wish to do an IUD and states that previously progesterone only OCPs made her headaches worse.  She has discussed with her neurologist staying on her current OCPs and he is comfortable with this option.  Pt wishes to continue her current OCPs and expresses understanding of risks/ benefits of continuing current regimen.  Tiajuana Amass. Tanga Gloor, CMA

## 2017-10-13 NOTE — Telephone Encounter (Signed)
I gave the patient a script for lidocaine ointment to use prior to the colposcopy for her discomfort with speculum insertion. I agree with the motrin, ativan is fine as long as she has a driver. I can try and put lidocaine gel on her cervix prior to the colposcopy.

## 2017-10-13 NOTE — Telephone Encounter (Signed)
Who would you recommend?  Tiajuana Amass. Nelson, CMA

## 2017-10-13 NOTE — Telephone Encounter (Signed)
Spoke with patient. Ready to schedule colpo. On continuous active OCP, no menses. Colpo scheduled for 10/19/17 at 9am with Dr. Oscar LaJertson. Order placed, patient aware will be called with benefits prior to procedure.   Patient is concerned with pain during colpo. Advised to take Motrin 800 mg with food and water one hour before procedure. Use lidocaine as previously instructed.   Patient has current RX for Ativan, asking if ok to take. Advised ok to take 1 hr prior, will need driver day of procedure and will need to come into the office before day of procedure to sign consent. Patient asking if any additional measures can be used for pain, advised will review with Dr. Oscar LaJertson and return call.   Dr. Oscar LaJertson -please review and advise on additonal pain management for colpo.    Cc: Harland DingwallSuzy Dixon, Sound[place] Pilonosa Davis

## 2017-10-18 NOTE — Progress Notes (Signed)
GYNECOLOGY  VISIT   HPI: 27 y.o.   Married  Caucasian  female   G0P0000 with No LMP recorded. (Menstrual status: Oral contraceptives).   here for  Colposcopy due to ASCUS positive HR HPV on 09/14/17.  At her last visit we discussed contraception. She has migraines with aura and I recommended she go off of OCP's. She hasn't tolerated the mini-pill, has a h/o severe cramps off of the pill. She declines the IUD.   GYNECOLOGIC HISTORY: No LMP recorded. (Menstrual status: Oral contraceptives). Contraception: OCP Menopausal hormone therapy: None        OB History    Gravida  0   Para  0   Term  0   Preterm  0   AB  0   Living  0     SAB  0   TAB  0   Ectopic  0   Multiple  0   Live Births  0              Patient Active Problem List   Diagnosis Date Noted  . Elevated LFTs 09/14/2017  . 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 Amenorrhea 04/11/2017  . Encounter for examination following treatment at hospital 04/11/2017  . Depression 03/30/2017  . Morbid obesity (HCC) 02/15/2017  . Healthcare maintenance 02/15/2017  . Migraine 07/14/2016  . Abdominal cramping 07/14/2016  . Anxiety 07/14/2016    Past Medical History:  Diagnosis Date  . Anxiety   . Arm fracture   . C. difficile colitis   . Depression   . HPV in female   . Migraine   . Obesity   . Vitamin D deficiency     Past Surgical History:  Procedure Laterality Date  . NO PAST SURGERIES      Current Outpatient Medications  Medication Sig Dispense Refill  . cyclobenzaprine (FLEXERIL) 5 MG tablet Take 1 tablet (5 mg total) by mouth at bedtime as needed for muscle spasms. 15 tablet 0  . dicyclomine (BENTYL) 10 MG capsule Take 1 capsule (10 mg total) by mouth 2 (two) times daily as needed for spasms. 60 capsule 0  . LORazepam (ATIVAN) 1 MG tablet 1 tablet by mouth every 8 hrs as needed for acute anxiety 30 tablet 0  . norethindrone-ethinyl estradiol 1/35 (ORTHO-NOVUM 1/35, 28,) tablet Take 1 tablet by mouth daily.  1 Package 11  . ondansetron (ZOFRAN-ODT) 8 MG disintegrating tablet Take 1 tablet (8 mg total) by mouth every 8 (eight) hours as needed for nausea or vomiting. 20 tablet 1  . sertraline (ZOLOFT) 100 MG tablet Take 1 tablet (100 mg total) by mouth daily. 90 tablet 1  . lidocaine (XYLOCAINE) 5 % ointment Apply a pea sized amount topically 20 minutes prior to intercourse, wipe off just prior to intercourse. (Patient not taking: Reported on 10/19/2017) 30 g 0   No current facility-administered medications for this visit.      ALLERGIES: Culturelle [lactobacillus] and Penicillins  Family History  Problem Relation Age of Onset  . Arthritis Mother   . Mental illness Mother   . Cancer Mother        skin   . Migraines Mother   . Depression Mother   . Alcohol abuse Father   . Hypertension Father   . Mental illness Father   . Depression Father   . Cancer Maternal Aunt        Bone  . Depression Maternal Aunt   . Lung cancer Maternal Aunt   .  Arthritis Maternal Grandmother   . Stroke Maternal Grandmother   . Mental illness Maternal Grandmother   . Skin cancer Maternal Grandmother   . Cancer Maternal Grandmother        skin  . Cancer Maternal Grandfather        lung  . Mental illness Maternal Grandfather   . Cancer Paternal Grandmother        Skin  . Depression Sister   . Depression Brother   . Cancer Maternal Uncle        skin  . Depression Maternal Uncle     Social History   Socioeconomic History  . Marital status: Married    Spouse name: Not on file  . Number of children: 0  . Years of education: 21  . Highest education level: Not on file  Occupational History  . Occupation: [employer]  Social Needs  . Financial resource strain: Not on file  . Food insecurity:    Worry: Not on file    Inability: Not on file  . Transportation needs:    Medical: Not on file    Non-medical: Not on file  Tobacco Use  . Smoking status: Never Smoker  . Smokeless tobacco: Never Used   Substance and Sexual Activity  . Alcohol use: Yes    Comment: occassional  . Drug use: No  . Sexual activity: Yes    Birth control/protection: Pill  Lifestyle  . Physical activity:    Days per week: Not on file    Minutes per session: Not on file  . Stress: Not on file  Relationships  . Social connections:    Talks on phone: Not on file    Gets together: Not on file    Attends religious service: Not on file    Active member of club or organization: Not on file    Attends meetings of clubs or organizations: Not on file    Relationship status: Not on file  . Intimate partner violence:    Fear of current or ex partner: Not on file    Emotionally abused: Not on file    Physically abused: Not on file    Forced sexual activity: Not on file  Other Topics Concern  . 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   HENT: Negative.   Eyes: Negative.   Respiratory: Negative.   Cardiovascular: Negative.   Gastrointestinal: Negative.   Genitourinary: Negative.   Musculoskeletal: Negative.   Skin: Negative.   Neurological: Negative.   Endo/Heme/Allergies: Negative.   Psychiatric/Behavioral: Negative.     PHYSICAL EXAMINATION:    BP 118/88 (BP Location: Right Arm, Patient Position: Sitting)   Pulse 80     General appearance: alert, cooperative and appears stated age  Pelvic: External genitalia:  no lesions              Urethra:  normal appearing urethra with no masses, tenderness or lesions              Bartholins and Skenes: normal                 Vagina: normal appearing vagina with normal color and discharge, no lesions              Cervix: no gross lesions  Colposcopy: satisfactory, mild aceto-white changes with punctation at 10 o'clock, biopsy taken (  think area was removed), ECC done. Hemostasis obtained with silver nitrate and monsels.  Negative lugols  examination of the upper vagina  Chaperone was present for exam.  ASSESSMENT ASCUS/HPV pap Contraception, she declines IUD, hasn't tolerated the mini-pill. She states that her Neurologist is okay with her being on OCP's even with her migraines with aura. I told her that I wasn't comfortable prescribing OCP's, but she could discuss it further with her primary    PLAN Colposcopy with biopsies and ECC done, further plan depending on results She will f/u with her primary in regards to contraception   An After Visit Summary was printed and given to the patient.  CC: William Hamburger, NP

## 2017-10-19 ENCOUNTER — Other Ambulatory Visit: Payer: Self-pay

## 2017-10-19 ENCOUNTER — Encounter: Payer: Self-pay | Admitting: Obstetrics and Gynecology

## 2017-10-19 ENCOUNTER — Ambulatory Visit (INDEPENDENT_AMBULATORY_CARE_PROVIDER_SITE_OTHER): Payer: 59 | Admitting: Obstetrics and Gynecology

## 2017-10-19 VITALS — BP 118/88 | HR 80

## 2017-10-19 DIAGNOSIS — Z01812 Encounter for preprocedural laboratory examination: Secondary | ICD-10-CM | POA: Diagnosis not present

## 2017-10-19 DIAGNOSIS — A630 Anogenital (venereal) warts: Secondary | ICD-10-CM

## 2017-10-19 DIAGNOSIS — R87610 Atypical squamous cells of undetermined significance on cytologic smear of cervix (ASC-US): Secondary | ICD-10-CM

## 2017-10-19 DIAGNOSIS — N871 Moderate cervical dysplasia: Secondary | ICD-10-CM | POA: Diagnosis not present

## 2017-10-19 DIAGNOSIS — Z3009 Encounter for other general counseling and advice on contraception: Secondary | ICD-10-CM

## 2017-10-19 LAB — POCT URINE PREGNANCY: PREG TEST UR: NEGATIVE

## 2017-10-19 NOTE — Patient Instructions (Signed)

## 2017-10-21 ENCOUNTER — Telehealth: Payer: Self-pay | Admitting: *Deleted

## 2017-10-21 DIAGNOSIS — D069 Carcinoma in situ of cervix, unspecified: Secondary | ICD-10-CM

## 2017-10-21 NOTE — Telephone Encounter (Signed)
Call to patient. Per DPR can leave message on voice mail.  Left message to call back to triage nurse.

## 2017-10-21 NOTE — Telephone Encounter (Signed)
Call to patient. Results and recommendations reviewed with patient. On continuous OCP.   Appointment 11-01-17. Advised to take Motrin 800 mg one hour prior with food.   Encounter closed.

## 2017-10-21 NOTE — Telephone Encounter (Signed)
-----   Message from Romualdo BolkJill Evelyn Jertson, MD sent at 10/21/2017 11:25 AM EDT ----- Please inform CIN III. She needs a leep, please schedule.

## 2017-10-21 NOTE — Telephone Encounter (Signed)
Patient is returning a call to triage. 

## 2017-10-31 ENCOUNTER — Telehealth: Payer: Self-pay | Admitting: Obstetrics and Gynecology

## 2017-10-31 NOTE — Telephone Encounter (Signed)
Spoke with patient, LEEP procedure reviewed, questions answered. Patient will proceed with LEEP as scheduled for 11/01/17 at 10:30am.  Routing to provider for final review. Patient is agreeable to disposition. Will close encounter.

## 2017-10-31 NOTE — Telephone Encounter (Signed)
Patient has questions about her procedure tomorrow.

## 2017-10-31 NOTE — Progress Notes (Signed)
GYNECOLOGY  VISIT   HPI: 27 y.o.   Married  Caucasian  female   G0P0000 with No LMP recorded. (Menstrual status: Oral contraceptives).   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  GYNECOLOGIC HISTORY: No LMP recorded. (Menstrual status: Oral contraceptives). Contraception: OCP Menopausal hormone therapy: None        OB History    Gravida  0   Para  0   Term  0   Preterm  0   AB  0   Living  0     SAB  0   TAB  0   Ectopic  0   Multiple  0   Live Births  0              Patient Active Problem List   Diagnosis Date Noted  . Elevated LFTs 09/14/2017  . 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 Amenorrhea 04/11/2017  . Encounter for examination following treatment at hospital 04/11/2017  . Depression 03/30/2017  . Morbid obesity (HCC) 02/15/2017  . Healthcare maintenance 02/15/2017  . Migraine 07/14/2016  . Abdominal cramping 07/14/2016  . Anxiety 07/14/2016    Past Medical History:  Diagnosis Date  . Anxiety   . Arm fracture   . C. difficile colitis   . Depression   . HPV in female   . Migraine   . Obesity   . Vitamin D deficiency     Past Surgical History:  Procedure Laterality Date  . NO PAST SURGERIES      Current Outpatient Medications  Medication Sig Dispense Refill  . cyclobenzaprine (FLEXERIL) 5 MG tablet Take 1 tablet (5 mg total) by mouth at bedtime as needed for muscle spasms. 15 tablet 0  . dicyclomine (BENTYL) 10 MG capsule Take 1 capsule (10 mg total) by mouth 2 (two) times daily as needed for spasms. 60 capsule 0  . lidocaine (XYLOCAINE) 5 % ointment Apply a pea sized amount topically 20 minutes prior to intercourse, wipe off just prior to intercourse. (Patient not taking: Reported on 10/19/2017) 30 g 0  . LORazepam (ATIVAN) 1 MG tablet 1 tablet by mouth every 8 hrs as needed for acute anxiety 30 tablet 0  . norethindrone-ethinyl estradiol 1/35 (ORTHO-NOVUM 1/35, 28,) tablet Take 1 tablet by mouth daily. 1 Package 11  . ondansetron  (ZOFRAN-ODT) 8 MG disintegrating tablet Take 1 tablet (8 mg total) by mouth every 8 (eight) hours as needed for nausea or vomiting. 20 tablet 1  . sertraline (ZOLOFT) 100 MG tablet Take 1 tablet (100 mg total) by mouth daily. 90 tablet 1   No current facility-administered medications for this visit.      ALLERGIES: Culturelle [lactobacillus] and Penicillins  Family History  Problem Relation Age of Onset  . Arthritis Mother   . Mental illness Mother   . Cancer Mother        skin   . Migraines Mother   . Depression Mother   . Alcohol abuse Father   . Hypertension Father   . Mental illness Father   . Depression Father   . Cancer Maternal Aunt        Bone  . Depression Maternal Aunt   . Lung cancer Maternal Aunt   . Arthritis Maternal Grandmother   . Stroke Maternal Grandmother   . Mental illness Maternal Grandmother   . Skin cancer Maternal Grandmother   . Cancer Maternal Grandmother        skin  . Cancer Maternal Grandfather  lung  . Mental illness Maternal Grandfather   . Cancer Paternal Grandmother        Skin  . Depression Sister   . Depression Brother   . Cancer Maternal Uncle        skin  . Depression Maternal Uncle     Social History   Socioeconomic History  . Marital status: Married    Spouse name: Not on file  . Number of children: 0  . Years of education: 3412  . Highest education level: Not on file  Occupational History  . Occupation: [employer]  Social Needs  . Financial resource strain: Not on file  . Food insecurity:    Worry: Not on file    Inability: Not on file  . Transportation needs:    Medical: Not on file    Non-medical: Not on file  Tobacco Use  . Smoking status: Never Smoker  . Smokeless tobacco: Never Used  Substance and Sexual Activity  . Alcohol use: Yes    Comment: occassional  . Drug use: No  . Sexual activity: Yes    Birth control/protection: Pill  Lifestyle  . Physical activity:    Days per week: Not on file    Minutes  per session: Not on file  . Stress: Not on file  Relationships  . Social connections:    Talks on phone: Not on file    Gets together: Not on file    Attends religious service: Not on file    Active member of club or organization: Not on file    Attends meetings of clubs or organizations: Not on file    Relationship status: Not on file  . Intimate partner violence:    Fear of current or ex partner: Not on file    Emotionally abused: Not on file    Physically abused: Not on file    Forced sexual activity: Not on file  Other Topics Concern  . 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   HENT: Negative.   Eyes: Negative.   Respiratory: Negative.   Cardiovascular: Negative.   Gastrointestinal: Negative.   Genitourinary:       Vaginal discharge Green occasionally in color Pain with intercourse  Musculoskeletal: Negative.   Skin: Negative.   Neurological: Negative.   Endo/Heme/Allergies: Negative.   Psychiatric/Behavioral: Negative.   The green d/c was one time. No current D/C  PHYSICAL EXAMINATION:    There were no vitals taken for this visit.    General appearance: alert, cooperative and appears stated age  Bimanual exam: uterus is normal size, contour, position, consistency, mobility, non-tender. No cervical motion tenderness  Pelvic: External genitalia:  no lesions              Urethra:  normal appearing urethra with no masses, tenderness or lesions              Bartholins and Skenes: normal                 Vagina: normal appearing vagina with normal color and discharge, no lesions              Cervix: no cervical motion tenderness and no lesions   Procedure: The patient was counseled as to the risks of the procedure, including: infection, bleeding, future pregnancy risks and cervical stenosis. A consent form was  signed.  Under colposcopic guidance,  acetic acid and then Lugols solution were placed on the cervix and a paracervical block was injected using 1% lidocaine with epinephrine. Under colposcopic guidance, the 1 x 1 cm loop was used to remove a portion of the ectocervix taking care to get the entire transformation zone.  The settings were 55 cut, 50 coag with a blend of 1.  An ECC was performed. The cautery ball was then used to cauterize the base of the biopsy site and monsels were placed. The patient tolerated the procedure well.                Chaperone was present for exam.  ASSESSMENT CIN III    PLAN Leep done F/U in one month   An After Visit Summary was printed and given to the patient.

## 2017-10-31 NOTE — Telephone Encounter (Signed)
Patient has some questions regarding her procedure tomorrow.

## 2017-11-01 ENCOUNTER — Ambulatory Visit: Payer: 59 | Admitting: Obstetrics and Gynecology

## 2017-11-01 ENCOUNTER — Encounter: Payer: Self-pay | Admitting: Obstetrics and Gynecology

## 2017-11-01 ENCOUNTER — Other Ambulatory Visit: Payer: Self-pay

## 2017-11-01 VITALS — BP 134/82 | HR 87 | Wt 303.0 lb

## 2017-11-01 DIAGNOSIS — D069 Carcinoma in situ of cervix, unspecified: Secondary | ICD-10-CM

## 2017-11-01 DIAGNOSIS — Z01812 Encounter for preprocedural laboratory examination: Secondary | ICD-10-CM

## 2017-11-01 LAB — POCT URINE PREGNANCY: Preg Test, Ur: NEGATIVE

## 2017-11-01 NOTE — Patient Instructions (Signed)
LEEP Post-procedure Instructions . Cramping is common.  You may take Ibuprofen, Aleve, or Tylenol for the cramping.  This should resolve within the next two to three days.   . You may have bright red spotting or blackish discharge for several days after your procedure.  The discharge occurs because of a topical solution used to stop bleeding at the biopsy site(s).  The dark discharge will lighten and then turn clear before completely resolving.  You will need to wear a mini pad during this time. . . Refrain from putting anything in the vagina until after your follow up visit . You need to call the office if you have any pelvic pain, fever, heavy bleeding, foul smelling vaginal discharge, or if you are concerned. . Shower or bathe as normal . You will be notified within one week of your biopsy results or we will discuss your results at your follow-up appointment if needed. . You will need to return for surveillance Pap smear(s) as advised by your physician.

## 2017-11-02 ENCOUNTER — Encounter: Payer: Self-pay | Admitting: Obstetrics and Gynecology

## 2017-11-02 DIAGNOSIS — N871 Moderate cervical dysplasia: Secondary | ICD-10-CM | POA: Diagnosis not present

## 2017-11-03 ENCOUNTER — Encounter: Payer: Self-pay | Admitting: Adult Health

## 2017-11-09 ENCOUNTER — Telehealth: Payer: Self-pay | Admitting: Obstetrics and Gynecology

## 2017-11-09 MED ORDER — NORETHINDRONE 0.35 MG PO TABS: 1.0000 | ORAL_TABLET | Freq: Every day | ORAL | 3 refills | Status: DC

## 2017-11-09 NOTE — Telephone Encounter (Signed)
Dr. Oscar LaJertson: 1. please review 11/02/17 path results and advise.  2.  Patient requesting oral contraceptive with no estrogen, please advise on POP RX.

## 2017-11-09 NOTE — Telephone Encounter (Signed)
Spoke with patient, advised as seen below per Dr. Oscar LaJertson. Rx for Micronor to verified pharmacy. OV scheduled for 05/08/17 at 1pm for repeat pap and hpv. Patient verbalizes understanding and is agreeable.   06 recall placed  Encounter closed.

## 2017-11-09 NOTE — Telephone Encounter (Signed)
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 The specimen was sent in one piece. I think she is fine. To be completely safe, I would recommend she have a repeat pap and hpv in 6 months.

## 2017-11-09 NOTE — Telephone Encounter (Signed)
Patient requesting results from visit. Patient also stated that she researched the OCP that Dr. Oscar LaJertson suggested and would like to try taking it. Patient unaware of name but stated that it had no estrogen.

## 2017-11-09 NOTE — Telephone Encounter (Signed)
Please call in micronor, 3 packs with 3 refills. She result note.

## 2017-11-21 ENCOUNTER — Telehealth: Payer: Self-pay

## 2017-11-21 NOTE — Telephone Encounter (Signed)
Called patient to verify that she wants [facility]. Left message for call back..Marland Kitchen

## 2017-11-22 ENCOUNTER — Other Ambulatory Visit: Payer: Self-pay

## 2017-11-22 ENCOUNTER — Telehealth: Payer: Self-pay | Admitting: Obstetrics and Gynecology

## 2017-11-22 MED ORDER — NORETHINDRONE 0.35 MG PO TABS: 1.0000 | ORAL_TABLET | Freq: Every day | ORAL | 3 refills | Status: DC

## 2017-11-22 NOTE — Telephone Encounter (Signed)
[facility]. Patient states she has 4 tablets left of birth control and would like RX [facility]. [facility] [address] [place] her. Please advise.

## 2017-11-22 NOTE — Telephone Encounter (Signed)
Medication refill request:  Last AEX:  11/01/17  Next AEX: 12/12/17 OV  Last MMG (if hormonal medication request): NA Refill authorized: Spoke with patient she is requesting [facility].

## 2017-11-23 MED ORDER — NORETHINDRONE 0.35 MG PO TABS: 1.0000 | ORAL_TABLET | Freq: Every day | ORAL | 0 refills | Status: DC

## 2017-11-23 NOTE — Telephone Encounter (Signed)
Informed patient [facility] was [facility].

## 2017-11-29 ENCOUNTER — Ambulatory Visit: Payer: 59 | Admitting: Internal Medicine

## 2017-12-02 ENCOUNTER — Encounter: Payer: Self-pay | Admitting: Obstetrics and Gynecology

## 2017-12-02 ENCOUNTER — Telehealth: Payer: Self-pay | Admitting: Obstetrics and Gynecology

## 2017-12-02 NOTE — Telephone Encounter (Signed)
Left message to call Kato Wieczorek at [phone].  

## 2017-12-02 NOTE — Telephone Encounter (Signed)
Spoke with patient. Patient switched from OCP to POP,  6 days into pack. Was taking OCP continuously active, no menses. Completed OCP pack before switching. Patient reports menses has started since started POP, changing 1 pad per day. Denies any other symptoms.  Patient asking if she will have a cycle on POP?   Patient is taking POP daily, roughly same time,  no missed pills.   Advised patient can take 3 months for cycles to adjust with POP, may have a cycle or may not. Recommended patient continue POP, continue to monitor menses and return call if irregular bleeding continues or bleeding becomes heavy. Patient has 1 mo f/u from LEEP on 9/16, will plan to discuss alternative contraceptives at that time.   Routing to provider for final review. Patient is agreeable to disposition. Will close encounter.

## 2017-12-02 NOTE — Telephone Encounter (Signed)
Hello I am having an issue with the Norethindrone. I went straight to the new medication from my old birth control and now having a period after being on the new birth control for 6 days. This is an actual period and not just spotting. I am concerned that this is going to be a regular thing (having periods) and not just something my body will adjust to. Is this normal and I will go back to not having periods?

## 2017-12-05 ENCOUNTER — Ambulatory Visit: Payer: 59 | Admitting: Obstetrics and Gynecology

## 2017-12-05 ENCOUNTER — Other Ambulatory Visit: Payer: Self-pay

## 2017-12-05 ENCOUNTER — Telehealth: Payer: Self-pay | Admitting: Obstetrics and Gynecology

## 2017-12-05 ENCOUNTER — Encounter: Payer: Self-pay | Admitting: Obstetrics and Gynecology

## 2017-12-05 VITALS — BP 122/80 | Wt 302.0 lb

## 2017-12-05 DIAGNOSIS — N939 Abnormal uterine and vaginal bleeding, unspecified: Secondary | ICD-10-CM

## 2017-12-05 DIAGNOSIS — Z3009 Encounter for other general counseling and advice on contraception: Secondary | ICD-10-CM | POA: Diagnosis not present

## 2017-12-05 DIAGNOSIS — Z9889 Other specified postprocedural states: Secondary | ICD-10-CM | POA: Diagnosis not present

## 2017-12-05 DIAGNOSIS — N946 Dysmenorrhea, unspecified: Secondary | ICD-10-CM

## 2017-12-05 LAB — POCT URINE PREGNANCY: Preg Test, Ur: NEGATIVE

## 2017-12-05 MED ORDER — IBUPROFEN 800 MG PO TABS: 800.0000 mg | ORAL_TABLET | Freq: Three times a day (TID) | ORAL | 1 refills | Status: DC | PRN

## 2017-12-05 NOTE — Telephone Encounter (Addendum)
Patient called with concerns she is passing large blood clots. She is available to come in to see Dr. Oscar La today. Routing to triage for further assessment and scheduling.  Last seen: 11/01/17 for a LEEP procedure

## 2017-12-05 NOTE — Progress Notes (Signed)
GYNECOLOGY  VISIT   HPI: 27 y.o.   Married  Caucasian  female   [id number] with No LMP recorded. (Menstrual status: Oral contraceptives).   here for menstrual problem.   The patient was switched from OCP's to POP's 1.5 weeks ago secondary to migraine with auras.  She had a cycle that started 8 days ago and she is still bleeding. She is changing her pad 3 x a day. She has passed many finger like clots.  She is having pelvic cramps intermittently. In between cramps she has a constant stinging pain. Has felt nausea intermittently. The cramps/pain are up to an 2-8/10 She has a h/o severe dysmenorrhea prior to starting OCP's. No cycles on OCP's.  She is one month s/p LEEP. 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 Negative ECC.  Married, long term partner. No STD concerns. No fevers. No emesis. Going from diarrhea to constipation in the last 1.5 weeks. No urinary c/o.  Things are otherwise going well. She got a job at [employer] and she and her husband have started marriage counseling.   GYNECOLOGIC HISTORY: No LMP recorded. (Menstrual status: Oral contraceptives). Contraception:OCP Menopausal hormone therapy: n/a        OB History    Gravida  0   Para  0   Term  0   Preterm  0   AB  0   Living  0     SAB  0   TAB  0   Ectopic  0   Multiple  0   Live Births  0              Patient Active Problem List   Diagnosis Date Noted  . Elevated LFTs 09/14/2017  . 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 Amenorrhea 04/11/2017  . Encounter for examination following treatment at hospital 04/11/2017  . Depression 03/30/2017  . Morbid obesity (HCC) 02/15/2017  . Healthcare maintenance 02/15/2017  . Migraine 07/14/2016  . Abdominal cramping 07/14/2016  . Anxiety 07/14/2016    Past Medical History:  Diagnosis Date  . Abnormal Pap smear of cervix   . Anxiety   . Arm fracture   . C. difficile colitis   . Depression   .  HPV in female   . Migraine   . Obesity   . Vitamin D deficiency     Past Surgical History:  Procedure Laterality Date  . NO PAST SURGERIES      Current Outpatient Medications  Medication Sig Dispense Refill  . cyclobenzaprine (FLEXERIL) 5 MG tablet Take 1 tablet (5 mg total) by mouth at bedtime as needed for muscle spasms. 15 tablet 0  . dicyclomine (BENTYL) 10 MG capsule Take 1 capsule (10 mg total) by mouth 2 (two) times daily as needed for spasms. 60 capsule 0  . lidocaine (XYLOCAINE) 5 % ointment Apply a pea sized amount topically 20 minutes prior to intercourse, wipe off just prior to intercourse. 30 g 0  . LORazepam (ATIVAN) 1 MG tablet 1 tablet by mouth every 8 hrs as needed for acute anxiety 30 tablet 0  . norethindrone (MICRONOR,CAMILA,ERRIN) 0.35 MG tablet Take 1 tablet (0.35 mg total) by mouth daily. 3 Package 0  . ondansetron (ZOFRAN-ODT) 8 MG disintegrating tablet Take 1 tablet (8 mg total) by mouth every 8 (eight) hours as needed for nausea or vomiting. 20 tablet 1  . sertraline (ZOLOFT) 100 MG tablet Take 1 tablet (100 mg total)  by mouth daily. 90 tablet 1   No current facility-administered medications for this visit.      ALLERGIES: Culturelle [lactobacillus] and Penicillins  Family History  Problem Relation Age of Onset  . Arthritis Mother   . Mental illness Mother   . Cancer Mother        skin   . Migraines Mother   . Depression Mother   . Alcohol abuse Father   . Hypertension Father   . Mental illness Father   . Depression Father   . Cancer Maternal Aunt        Bone  . Depression Maternal Aunt   . Lung cancer Maternal Aunt   . Arthritis Maternal Grandmother   . Stroke Maternal Grandmother   . Mental illness Maternal Grandmother   . Skin cancer Maternal Grandmother   . Cancer Maternal Grandmother        skin  . Cancer Maternal Grandfather        lung  . Mental illness Maternal Grandfather   . Cancer Paternal Grandmother        Skin  . Depression  Sister   . Depression Brother   . Cancer Maternal Uncle        skin  . Depression Maternal Uncle     Social History   Socioeconomic History  . Marital status: Married    Spouse name: Not on file  . Number of children: 0  . Years of education: 46  . Highest education level: Not on file  Occupational History  . Occupation: [employer]  Social Needs  . Financial resource strain: Not on file  . Food insecurity:    Worry: Not on file    Inability: Not on file  . Transportation needs:    Medical: Not on file    Non-medical: Not on file  Tobacco Use  . Smoking status: Never Smoker  . Smokeless tobacco: Never Used  Substance and Sexual Activity  . Alcohol use: Yes    Comment: occassional  . Drug use: No  . Sexual activity: Yes    Birth control/protection: Pill  Lifestyle  . Physical activity:    Days per week: Not on file    Minutes per session: Not on file  . Stress: Not on file  Relationships  . Social connections:    Talks on phone: Not on file    Gets together: Not on file    Attends religious service: Not on file    Active member of club or organization: Not on file    Attends meetings of clubs or organizations: Not on file    Relationship status: Not on file  . Intimate partner violence:    Fear of current or ex partner: Not on file    Emotionally abused: Not on file    Physically abused: Not on file    Forced sexual activity: Not on file  Other Topics Concern  . 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   HENT: Negative.   Eyes: Negative.   Respiratory: Negative.   Cardiovascular: Negative.   Gastrointestinal: Positive for abdominal pain, constipation and diarrhea.  Genitourinary: Positive for urgency.       Excessive menstrual bleeding  Musculoskeletal: Negative.   Skin: Negative.   Neurological: Negative.   Endo/Heme/Allergies:        Craving sweets Excessive thirst  Heat/cold intolerance  Psychiatric/Behavioral: Positive for depression. The patient is nervous/anxious.   All other systems reviewed and are negative.   PHYSICAL EXAMINATION:    BP 122/80   Wt (!) 302 lb (137 kg)   BMI 51.84 kg/m     General appearance: alert, cooperative and appears stated age Abdomen: soft, non-tender; non distended, no masses,  no organomegaly  Pelvic: External genitalia:  no lesions              Urethra:  normal appearing urethra with no masses, tenderness or lesions              Bartholins and Skenes: normal                 Vagina: normal appearing vagina with normal color and discharge, no lesions              Cervix: no cervical motion tenderness, no lesions and well healed s/p leep              Bimanual Exam:  Uterus:  normal size, contour, position, consistency, mobility, non-tender              Adnexa: no mass, fullness, tenderness               Chaperone was present for exam.  ASSESSMENT AUB after starting on POP, bleeding is not excessive, exam is normal Dysmenorrhea Pelvic pain, normal exam. 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 Reassured that AUB is common after starting POP Call if bleeding doesn't improve   An After Visit Summary was printed and given to the patient.  ~25 minutes face to face time of which over 50% was spent in counseling.

## 2017-12-05 NOTE — Addendum Note (Signed)
Addended by: Judd Gaudier on: 12/05/2017 02:20 PM   Modules accepted: Orders

## 2017-12-05 NOTE — Telephone Encounter (Signed)
Spoke with patient. S/p LEEP 8/6. Patient started new POP last week, see previous telephone encounter dated 9/6. Reports 8-9 "finger size" clots since 9/8. Patient states she is concerned and this is not normal for her. Changing pad 2x/day.  LMP 9/3. No missed or late pills. Reports lightheadedness today & menses cramping.  Recommended OV for further evaluation. OV scheduled for today at 1:15pm with Dr. Oscar La. Patient declined earlier time, has to drop her husband off at appointment. recommended no driving if experiencing dizziness or lightheaded.   Patient verbalizes understanding and is agreeable. Encounter closed.

## 2017-12-06 LAB — CBC
HEMATOCRIT: 43 % (ref 34.0–46.6)
Hemoglobin: 13.9 g/dL (ref 11.1–15.9)
MCH: 28.6 pg (ref 26.6–33.0)
MCHC: 32.3 g/dL (ref 31.5–35.7)
MCV: 89 fL (ref 79–97)
Platelets: 301 10*3/uL (ref 150–450)
RBC: 4.86 x10E6/uL (ref 3.77–5.28)
RDW: 13.2 % (ref 12.3–15.4)
WBC: 11.1 10*3/uL — AB (ref 3.4–10.8)

## 2017-12-06 LAB — FERRITIN: Ferritin: 141 ng/mL (ref 15–150)

## 2017-12-12 ENCOUNTER — Ambulatory Visit: Payer: 59 | Admitting: Obstetrics and Gynecology

## 2018-01-08 DIAGNOSIS — Z23 Encounter for immunization: Secondary | ICD-10-CM | POA: Diagnosis not present

## 2018-02-01 ENCOUNTER — Encounter: Payer: Self-pay | Admitting: Adult Health

## 2018-02-01 ENCOUNTER — Ambulatory Visit (INDEPENDENT_AMBULATORY_CARE_PROVIDER_SITE_OTHER): Payer: 59 | Admitting: Adult Health

## 2018-02-01 VITALS — BP 114/72 | HR 117 | Temp 100.6°F | Wt 303.7 lb

## 2018-02-01 DIAGNOSIS — R059 Cough, unspecified: Secondary | ICD-10-CM | POA: Insufficient documentation

## 2018-02-01 DIAGNOSIS — M791 Myalgia, unspecified site: Secondary | ICD-10-CM | POA: Diagnosis not present

## 2018-02-01 DIAGNOSIS — J029 Acute pharyngitis, unspecified: Secondary | ICD-10-CM | POA: Diagnosis not present

## 2018-02-01 DIAGNOSIS — R05 Cough: Secondary | ICD-10-CM | POA: Insufficient documentation

## 2018-02-01 DIAGNOSIS — R509 Fever, unspecified: Secondary | ICD-10-CM

## 2018-02-01 LAB — POCT RAPID STREP A (OFFICE): Rapid Strep A Screen: NEGATIVE

## 2018-02-01 LAB — POCT INFLUENZA A/B
INFLUENZA B, POC: NEGATIVE
Influenza A, POC: NEGATIVE

## 2018-02-01 MED ORDER — OSELTAMIVIR PHOSPHATE 75 MG PO CAPS: 75.0000 mg | ORAL_CAPSULE | Freq: Two times a day (BID) | ORAL | 0 refills | Status: DC

## 2018-02-01 MED ORDER — HYDROCOD POLST-CPM POLST ER 10-8 MG/5ML PO SUER: 5.0000 mL | Freq: Two times a day (BID) | ORAL | 0 refills | Status: DC | PRN

## 2018-02-01 NOTE — Assessment & Plan Note (Signed)
Flu Test- Neg  

## 2018-02-01 NOTE — Progress Notes (Signed)
Subjective:    Patient ID: Lisa Logan, female    DOB: 11/27/1990, 27 y.o.   MRN: [id number]  HPI:  Lisa Logan presents with copious clear nasal drainage, productive cough (thick, green mucus), extreme fatigue, constant frontal HA (9/10, throbbing), sore throat (8/10), poor appetite, and she states "it feels like I am pulling my body around". She reports sx started about 2, 2.5 days ago She reports pushing fluids and using OTC NyQuil, last dose 48 hrs ago, current temp 100.6 f She denies being exposed to anyone with known flu She denies CP/dyspnea/dizziness/palpiations She has been unable to work all week due to severity of sx's  Patient Care Team    Relationship Specialty Notifications Start End  William Hamburger D, NP PCP - General Family Medicine  01/26/17   York Spaniel, MD Consulting Physician Neurology  02/15/17   Conard Novak, MD Attending Physician Obstetrics and Gynecology  02/15/17     Patient Active Problem List   Diagnosis Date Noted  . Myalgia 02/01/2018  . Fever 02/01/2018  . Pharyngitis 02/01/2018  . Cough 02/01/2018  . Elevated LFTs 09/14/2017  . 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 Amenorrhea 04/11/2017  . Encounter for examination following treatment at hospital 04/11/2017  . Depression 03/30/2017  . Morbid obesity (HCC) 02/15/2017  . Healthcare maintenance 02/15/2017  . Migraine 07/14/2016  . Abdominal cramping 07/14/2016  . Anxiety 07/14/2016     Past Medical History:  Diagnosis Date  . Abnormal Pap smear of cervix   . Anxiety   . Arm fracture   . C. difficile colitis   . Depression   . HPV in female   . Migraine   . Obesity   . Vitamin D deficiency      Past Surgical History:  Procedure Laterality Date  . NO PAST SURGERIES       Family History  Problem Relation Age of Onset  . Arthritis Mother   . Mental illness Mother   . Cancer Mother        skin   . Migraines Mother   . Depression Mother   . Alcohol abuse Father   .  Hypertension Father   . Mental illness Father   . Depression Father   . Cancer Maternal Aunt        Bone  . Depression Maternal Aunt   . Lung cancer Maternal Aunt   . Arthritis Maternal Grandmother   . Stroke Maternal Grandmother   . Mental illness Maternal Grandmother   . Skin cancer Maternal Grandmother   . Cancer Maternal Grandmother        skin  . Cancer Maternal Grandfather        lung  . Mental illness Maternal Grandfather   . Cancer Paternal Grandmother        Skin  . Depression Sister   . Depression Brother   . Cancer Maternal Uncle        skin  . Depression Maternal Uncle      Social History   Substance and Sexual Activity  Drug Use No     Social History   Substance and Sexual Activity  Alcohol Use Yes   Comment: occassional     Social History   Tobacco Use  Smoking Status Never Smoker  Smokeless Tobacco Never Used     Outpatient Encounter Medications as of 02/01/2018  Medication Sig  . cyclobenzaprine (FLEXERIL) 5 MG tablet Take 1 tablet (5 mg total) by mouth at bedtime as  needed for muscle spasms.  Marland Kitchen dicyclomine (BENTYL) 10 MG capsule Take 1 capsule (10 mg total) by mouth 2 (two) times daily as needed for spasms.  Marland Kitchen ibuprofen (ADVIL,MOTRIN) 800 MG tablet Take 1 tablet (800 mg total) by mouth every 8 (eight) hours as needed.  . lidocaine (XYLOCAINE) 5 % ointment Apply a pea sized amount topically 20 minutes prior to intercourse, wipe off just prior to intercourse.  . norethindrone (MICRONOR,CAMILA,ERRIN) 0.35 MG tablet Take 1 tablet (0.35 mg total) by mouth daily.  . ondansetron (ZOFRAN-ODT) 8 MG disintegrating tablet Take 1 tablet (8 mg total) by mouth every 8 (eight) hours as needed for nausea or vomiting.  . chlorpheniramine-HYDROcodone (TUSSIONEX) 10-8 MG/5ML SUER Take 5 mLs by mouth every 12 (twelve) hours as needed.  Marland Kitchen oseltamivir (TAMIFLU) 75 MG capsule Take 1 capsule (75 mg total) by mouth 2 (two) times daily.  . [DISCONTINUED] LORazepam  (ATIVAN) 1 MG tablet 1 tablet by mouth every 8 hrs as needed for acute anxiety  . [DISCONTINUED] sertraline (ZOLOFT) 100 MG tablet Take 1 tablet (100 mg total) by mouth daily.   No facility-administered encounter medications on file as of 02/01/2018.     Allergies: Culturelle [lactobacillus] and Penicillins  Body mass index is 52.13 kg/m.  Blood pressure 114/72, pulse (!) 117, temperature (!) 100.6 F (38.1 C), temperature source Oral, weight (!) 303 lb 11.2 oz (137.8 kg), SpO2 97 %.  Review of Systems  Constitutional: Positive for activity change, appetite change, chills, diaphoresis, fatigue and fever. Negative for unexpected weight change.  HENT: Positive for congestion, postnasal drip, rhinorrhea, sinus pressure, sinus pain, sneezing, sore throat and voice change.   Eyes: Negative for visual disturbance.  Respiratory: Positive for cough. Negative for chest tightness, shortness of breath, wheezing and stridor.   Cardiovascular: Negative for chest pain, palpitations and leg swelling.  Gastrointestinal: Negative for abdominal distention, abdominal pain, blood in stool, constipation, diarrhea, nausea, rectal pain and vomiting.  Musculoskeletal: Positive for myalgias.  Neurological: Positive for light-headedness and headaches. Negative for dizziness.  Hematological: Does not bruise/bleed easily.  Psychiatric/Behavioral: Positive for sleep disturbance.       Objective:   Physical Exam  Constitutional: She appears well-nourished. She has a sickly appearance. She appears ill. She appears distressed.  HENT:  Head: Normocephalic and atraumatic.  Right Ear: External ear normal. Tympanic membrane is bulging. Tympanic membrane is not erythematous. No decreased hearing is noted.  Left Ear: External ear normal. Tympanic membrane is bulging. Tympanic membrane is not erythematous. No decreased hearing is noted.  Nose: Mucosal edema and rhinorrhea present. Right sinus exhibits frontal sinus  tenderness. Right sinus exhibits no maxillary sinus tenderness. Left sinus exhibits frontal sinus tenderness. Left sinus exhibits no maxillary sinus tenderness.  Mouth/Throat: Mucous membranes are dry. Abnormal dentition. Posterior oropharyngeal edema and posterior oropharyngeal erythema present. No oropharyngeal exudate or tonsillar abscesses. Tonsils are 3+ on the right. Tonsils are 3+ on the left. No tonsillar exudate.  Eyes: Pupils are equal, round, and reactive to light. Conjunctivae and EOM are normal.  Neck: Normal range of motion. Neck supple.  Cardiovascular: Regular rhythm, normal heart sounds and intact distal pulses. Tachycardia present.  No murmur heard. Pulmonary/Chest: Effort normal and breath sounds normal. No stridor. No respiratory distress. She has no wheezes. She has no rales. She exhibits no tenderness.  Lymphadenopathy:    She has no cervical adenopathy.  Skin: Skin is warm. Capillary refill takes less than 2 seconds. She is diaphoretic.  Very warm to the  touch  Psychiatric: She has a normal mood and affect. Her behavior is normal. Judgment and thought content normal.  Nursing note and vitals reviewed.     Assessment & Plan:   1. Fever, unspecified fever cause   2. Myalgia   3. Pharyngitis, unspecified etiology   4. Cough     Myalgia Flu Test Neg  Pharyngitis Strep Test   Fever  Strep Test Neg Flu Tes Neg, however with severity of symptoms will treat for flu. Please take Tamilfu as directed. Please take Tussionex as needed for cough. Increase fluids, rest, vit c-2,000mg /day. Alternate OTC Acetaminophen and Ibuprofen as needed for fever/discomfort. Work Excuse provided, okay to return if afebrile Monday 02/06/18 Please call clinic with any questions/concerns.  Cough [place] Controlled Substance Database reviewed- No aberrancies noted Tussionex as needed  Pt was in the office today for 25+ minutes, I spent >75% of spent in face to face  counseling of patient's various medical conditions and in coordination of care  FOLLOW-UP:  Return if symptoms worsen or fail to improve.

## 2018-02-01 NOTE — Assessment & Plan Note (Signed)
Strep Test

## 2018-02-01 NOTE — Assessment & Plan Note (Signed)
  Strep Test Neg Flu Tes Neg, however with severity of symptoms will treat for flu. Please take Tamilfu as directed. Please take Tussionex as needed for cough. Increase fluids, rest, vit c-2,000mg /day. Alternate OTC Acetaminophen and Ibuprofen as needed for fever/discomfort. Work Excuse provided, okay to return if afebrile Monday 02/06/18 Please call clinic with any questions/concerns.

## 2018-02-01 NOTE — Patient Instructions (Addendum)
Influenza, Adult Influenza ("the flu") is an infection in the lungs, nose, and throat (respiratory tract). It is caused by a virus. The flu causes many common cold symptoms, as well as a high fever and body aches. It can make you feel very sick. The flu spreads easily from person to person (is contagious). Getting a flu shot (influenza vaccination) every year is the best way to prevent the flu. Follow these instructions at home:  Take over-the-counter and prescription medicines only as told by your doctor.  Use a cool mist humidifier to add moisture (humidity) to the air in your home. This can make it easier to breathe.  Rest as needed.  Drink enough fluid to keep your pee (urine) clear or pale yellow.  Cover your mouth and nose when you cough or sneeze.  Wash your hands with soap and water often, especially after you cough or sneeze. If you cannot use soap and water, use hand sanitizer.  Stay home from work or school as told by your doctor. Unless you are visiting your doctor, try to avoid leaving home until your fever has been gone for 24 hours without the use of medicine.  Keep all follow-up visits as told by your doctor. This is important. How is this prevented?  Getting a yearly (annual) flu shot is the best way to avoid getting the flu. You may get the flu shot in late summer, fall, or winter. Ask your doctor when you should get your flu shot.  Wash your hands often or use hand sanitizer often.  Avoid contact with people who are sick during cold and flu season.  Eat healthy foods.  Drink plenty of fluids.  Get enough sleep.  Exercise regularly. Contact a doctor if:  You get new symptoms.  You have: ? Chest pain. ? Watery poop (diarrhea). ? A fever.  Your cough gets worse.  You start to have more mucus.  You feel sick to your stomach (nauseous).  You throw up (vomit). Get help right away if:  You start to be short of breath or have trouble breathing.  Your  skin or nails turn a bluish color.  You have very bad pain or stiffness in your neck.  You get a sudden headache.  You get sudden pain in your face or ear.  You cannot stop throwing up. This information is not intended to replace advice given to you by your health care provider. Make sure you discuss any questions you have with your health care provider. Document Released: 12/23/2007 Document Revised: 08/21/2015 Document Reviewed: 01/07/2015 Elsevier Interactive Patient Education  2017 [employer]  Strep Test Neg Flu Tes Neg, however with severity of symptoms will treat for flu. Please take Tamilfu as directed. Please take Tussionex as needed for cough. Increase fluids, rest, vit c-2,000mg /day. Alternate OTC Acetaminophen and Ibuprofen as needed for fever/discomfort. Work Excuse provided, okay to return if afebrile Monday 02/06/18 Please call clinic with any questions/concerns. FEEL BETTER!

## 2018-02-01 NOTE — Assessment & Plan Note (Signed)
[place] Controlled Substance Database reviewed- No aberrancies noted Tussionex as needed 

## 2018-02-03 ENCOUNTER — Encounter: Payer: Self-pay | Admitting: Adult Health

## 2018-03-16 ENCOUNTER — Ambulatory Visit: Payer: 59 | Admitting: Adult Health

## 2018-05-04 NOTE — Progress Notes (Deleted)
GYNECOLOGY  VISIT   HPI: 28 y.o.   Married White or Caucasian Not Hispanic or Latino  female   G0P0000 with No LMP recorded. (Menstrual status: Oral contraceptives).   here for     GYNECOLOGIC HISTORY: No LMP recorded. (Menstrual status: Oral contraceptives). Contraception:*** Menopausal hormone therapy: ***        OB History    Gravida  0   Para  0   Term  0   Preterm  0   AB  0   Living  0     SAB  0   TAB  0   Ectopic  0   Multiple  0   Live Births  0              Patient Active Problem List   Diagnosis Date Noted  . Myalgia 02/01/2018  . Fever 02/01/2018  . Pharyngitis 02/01/2018  . Cough 02/01/2018  . Elevated LFTs 09/14/2017  . 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 Amenorrhea 04/11/2017  . Encounter for examination following treatment at hospital 04/11/2017  . Depression 03/30/2017  . Morbid obesity (HCC) 02/15/2017  . Healthcare maintenance 02/15/2017  . Migraine 07/14/2016  . Abdominal cramping 07/14/2016  . Anxiety 07/14/2016    Past Medical History:  Diagnosis Date  . Abnormal Pap smear of cervix   . Anxiety   . Arm fracture   . C. difficile colitis   . Depression   . HPV in female   . Migraine   . Obesity   . Vitamin D deficiency     Past Surgical History:  Procedure Laterality Date  . NO PAST SURGERIES      Current Outpatient Medications  Medication Sig Dispense Refill  . chlorpheniramine-HYDROcodone (TUSSIONEX) 10-8 MG/5ML SUER Take 5 mLs by mouth every 12 (twelve) hours as needed. 200 mL 0  . cyclobenzaprine (FLEXERIL) 5 MG tablet Take 1 tablet (5 mg total) by mouth at bedtime as needed for muscle spasms. 15 tablet 0  . dicyclomine (BENTYL) 10 MG capsule Take 1 capsule (10 mg total) by mouth 2 (two) times daily as needed for spasms. 60 capsule 0  . ibuprofen (ADVIL,MOTRIN) 800 MG tablet Take 1 tablet (800 mg total) by mouth every 8 (eight) hours as needed. 30 tablet 1  . lidocaine (XYLOCAINE) 5 % ointment Apply a pea sized amount  topically 20 minutes prior to intercourse, wipe off just prior to intercourse. 30 g 0  . norethindrone (MICRONOR,CAMILA,ERRIN) 0.35 MG tablet Take 1 tablet (0.35 mg total) by mouth daily. 3 Package 0  . ondansetron (ZOFRAN-ODT) 8 MG disintegrating tablet Take 1 tablet (8 mg total) by mouth every 8 (eight) hours as needed for nausea or vomiting. 20 tablet 1  . oseltamivir (TAMIFLU) 75 MG capsule Take 1 capsule (75 mg total) by mouth 2 (two) times daily. 10 capsule 0   No current facility-administered medications for this visit.      ALLERGIES: Culturelle [lactobacillus] and Penicillins  Family History  Problem Relation Age of Onset  . Arthritis Mother   . Mental illness Mother   . Cancer Mother        skin   . Migraines Mother   . Depression Mother   . Alcohol abuse Father   . Hypertension Father   . Mental illness Father   . Depression Father   . Cancer Maternal Aunt        Bone  . Depression Maternal Aunt   . Lung cancer Maternal Aunt   .  Arthritis Maternal Grandmother   . Stroke Maternal Grandmother   . Mental illness Maternal Grandmother   . Skin cancer Maternal Grandmother   . Cancer Maternal Grandmother        skin  . Cancer Maternal Grandfather        lung  . Mental illness Maternal Grandfather   . Cancer Paternal Grandmother        Skin  . Depression Sister   . Depression Brother   . Cancer Maternal Uncle        skin  . Depression Maternal Uncle     Social History   Socioeconomic History  . Marital status: Married    Spouse name: Not on file  . Number of children: 0  . Years of education: 41  . Highest education level: Not on file  Occupational History  . Occupation: [employer]  Social Needs  . Financial resource strain: Not on file  . Food insecurity:    Worry: Not on file    Inability: Not on file  . Transportation needs:    Medical: Not on file    Non-medical: Not on file  Tobacco Use  . Smoking status: Never Smoker  . Smokeless tobacco: Never  Used  Substance and Sexual Activity  . Alcohol use: Yes    Comment: occassional  . Drug use: No  . Sexual activity: Yes    Birth control/protection: Pill  Lifestyle  . Physical activity:    Days per week: Not on file    Minutes per session: Not on file  . Stress: Not on file  Relationships  . Social connections:    Talks on phone: Not on file    Gets together: Not on file    Attends religious service: Not on file    Active member of club or organization: Not on file    Attends meetings of clubs or organizations: Not on file    Relationship status: Not on file  . Intimate partner violence:    Fear of current or ex partner: Not on file    Emotionally abused: Not on file    Physically abused: Not on file    Forced sexual activity: Not on file  Other Topics Concern  . Not on file  Social History Narrative   Married   Works at [employer], [employer]   Right-handed   Caffeine: tea, soda    ROS  PHYSICAL EXAMINATION:    There were no vitals taken for this visit.    General appearance: alert, cooperative and appears stated age Neck: no adenopathy, supple, symmetrical, trachea midline and thyroid {CHL AMB PHY EX THYROID NORM [phone]::"normal to inspection and palpation"} Breasts: {Exam; breast:13139::"normal appearance, no masses or tenderness"} Abdomen: soft, non-tender; non distended, no masses,  no organomegaly  Pelvic: External genitalia:  no lesions              Urethra:  normal appearing urethra with no masses, tenderness or lesions              Bartholins and Skenes: normal                 Vagina: normal appearing vagina with normal color and discharge, no lesions              Cervix: {CHL AMB PHY EX CERVIX NORM [phone]::"no lesions"}              Bimanual Exam:  Uterus:  {CHL AMB PHY EX UTERUS NORM DEFAULT:[phone]::"normal size, contour, position,  consistency, mobility, non-tender"}              Adnexa: {CHL AMB PHY EX ADNEXA NO MASS  DEFAULT:[phone]::"no mass, fullness, tenderness"}              Rectovaginal: {yes no:[id number]}.  Confirms.              Anus:  normal sphincter tone, no lesions  Chaperone was present for exam.  ASSESSMENT     PLAN    An After Visit Summary was printed and given to the patient.  *** minutes face to face time of which over 50% was spent in counseling.

## 2018-05-05 ENCOUNTER — Telehealth: Payer: Self-pay | Admitting: Obstetrics and Gynecology

## 2018-05-05 NOTE — Telephone Encounter (Signed)
Patient canceled her 17mp pap & hpv May 19, 2018 due to death in family. She will call to reschedule.

## 2018-05-06 ENCOUNTER — Encounter: Payer: Self-pay | Admitting: Adult Health

## 2018-05-08 ENCOUNTER — Ambulatory Visit: Payer: Self-pay | Admitting: Obstetrics and Gynecology

## 2018-06-01 ENCOUNTER — Telehealth: Payer: Self-pay

## 2018-06-01 NOTE — Telephone Encounter (Signed)
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 Please contact patient to schedule 61month pap and HPV.

## 2018-06-02 NOTE — Telephone Encounter (Signed)
Patient has appointment for 6 month pap and hpv scheduled for 06-12-2018.

## 2018-06-08 NOTE — Progress Notes (Signed)
GYNECOLOGY  VISIT   HPI: 28 y.o.   Married White or Caucasian Not Hispanic or Latino  female   G0P0000 with No LMP recorded. (Menstrual status: Oral contraceptives).   here for   Repeat 6 month pap. She is s/p Leep last summer.  Diagnosis 1. 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 - LOW GRADE SQUAMOUS INTRAEPITHELIAL LESION (CIN-I, MILD SQUAMOUS DYSPLASIA WITH HPV EFFECT) IN CERVICAL TRANSFORMATION ZONE MUCOSA. - NO INVASIVE PROCESS IDENTIFIED. SURGICAL RESECTION MARGIN APPEARS UNINVOLVED. - SEE COMMENT. 2. Endocervix, curettage - BENIGN ENDOCERVICAL GLANDULAR TISSUE. - NO ATYPIA, DYSPLASIA, OR MALIGNANCY IDENTIFIED. Microscopic Comment 1. The fragment of detached epithelium with moderate dysplasia is unoriented, and I cannot adequately access its resection margin.. (JDP:gt, 11/08/17)   She is having family stress, Aunt died recently. She is not speaking with either of her parents. Husband is supportive.   GYNECOLOGIC HISTORY: No LMP recorded. (Menstrual status: Oral contraceptives). Contraception:OCP Menopausal hormone therapy: none        OB History    Gravida  0   Para  0   Term  0   Preterm  0   AB  0   Living  0     SAB  0   TAB  0   Ectopic  0   Multiple  0   Live Births  0              Patient Active Problem List   Diagnosis Date Noted  . Myalgia 02/01/2018  . Fever 02/01/2018  . Pharyngitis 02/01/2018  . Cough 02/01/2018  . Elevated LFTs 09/14/2017  . 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 Amenorrhea 04/11/2017  . Encounter for examination following treatment at hospital 04/11/2017  . Depression 03/30/2017  . Morbid obesity (HCC) 02/15/2017  . Healthcare maintenance 02/15/2017  . Migraine 07/14/2016  . Abdominal cramping 07/14/2016  . Anxiety 07/14/2016    Past Medical History:  Diagnosis Date  . Abnormal Pap smear of cervix   . Anxiety   .  Arm fracture   . C. difficile colitis   . Depression   . HPV in female   . Migraine   . Obesity   . Vitamin D deficiency     Past Surgical History:  Procedure Laterality Date  . NO PAST SURGERIES      Current Outpatient Medications  Medication Sig Dispense Refill  . chlorpheniramine-HYDROcodone (TUSSIONEX) 10-8 MG/5ML SUER Take 5 mLs by mouth every 12 (twelve) hours as needed. 200 mL 0  . cyclobenzaprine (FLEXERIL) 5 MG tablet Take 1 tablet (5 mg total) by mouth at bedtime as needed for muscle spasms. 15 tablet 0  . dicyclomine (BENTYL) 10 MG capsule Take 1 capsule (10 mg total) by mouth 2 (two) times daily as needed for spasms. 60 capsule 0  . ibuprofen (ADVIL,MOTRIN) 800 MG tablet Take 1 tablet (800 mg total) by mouth every 8 (eight) hours as needed. 30 tablet 1  . lidocaine (XYLOCAINE) 5 % ointment Apply a pea sized amount topically 20 minutes prior to intercourse, wipe off just prior to intercourse. 30 g 0  . norethindrone (MICRONOR,CAMILA,ERRIN) 0.35 MG tablet Take 1 tablet (0.35 mg total) by mouth daily. 3 Package 0  . ondansetron (ZOFRAN-ODT) 8 MG disintegrating tablet Take 1 tablet (8 mg total) by mouth every 8 (eight) hours as needed for nausea or vomiting. 20 tablet 1  . oseltamivir (TAMIFLU) 75 MG capsule Take 1 capsule (  75 mg total) by mouth 2 (two) times daily. 10 capsule 0   No current facility-administered medications for this visit.      ALLERGIES: Culturelle [lactobacillus] and Penicillins  Family History  Problem Relation Age of Onset  . Arthritis Mother   . Mental illness Mother   . Cancer Mother        skin   . Migraines Mother   . Depression Mother   . Alcohol abuse Father   . Hypertension Father   . Mental illness Father   . Depression Father   . Cancer Maternal Aunt        Bone  . Depression Maternal Aunt   . Lung cancer Maternal Aunt   . Arthritis Maternal Grandmother   . Stroke Maternal Grandmother   . Mental illness Maternal Grandmother   .  Skin cancer Maternal Grandmother   . Cancer Maternal Grandmother        skin  . Cancer Maternal Grandfather        lung  . Mental illness Maternal Grandfather   . Cancer Paternal Grandmother        Skin  . Depression Sister   . Depression Brother   . Cancer Maternal Uncle        skin  . Depression Maternal Uncle     Social History   Socioeconomic History  . Marital status: Married    Spouse name: Not on file  . Number of children: 0  . Years of education: 40  . Highest education level: Not on file  Occupational History  . Occupation: [employer]  Social Needs  . Financial resource strain: Not on file  . Food insecurity:    Worry: Not on file    Inability: Not on file  . Transportation needs:    Medical: Not on file    Non-medical: Not on file  Tobacco Use  . Smoking status: Never Smoker  . Smokeless tobacco: Never Used  Substance and Sexual Activity  . Alcohol use: Yes    Comment: occassional  . Drug use: No  . Sexual activity: Yes    Birth control/protection: Pill  Lifestyle  . Physical activity:    Days per week: Not on file    Minutes per session: Not on file  . Stress: Not on file  Relationships  . Social connections:    Talks on phone: Not on file    Gets together: Not on file    Attends religious service: Not on file    Active member of club or organization: Not on file    Attends meetings of clubs or organizations: Not on file    Relationship status: Not on file  . Intimate partner violence:    Fear of current or ex partner: Not on file    Emotionally abused: Not on file    Physically abused: Not on file    Forced sexual activity: Not on file  Other Topics Concern  . Not on file  Social History Narrative   Married   Works at [employer], [employer]   Right-handed   Caffeine: tea, soda    Review of Systems  Constitutional: Positive for weight loss.  Neurological: Positive for headaches.    PHYSICAL EXAMINATION:    BP 122/72   Pulse 88   Resp 16   Ht  5\' 2"  (1.575 m)   Wt 293 lb (132.9 kg)   BMI 53.59 kg/m     General appearance: alert, cooperative and appears stated age  Pelvic: External genitalia:  no lesions              Urethra:  normal appearing urethra with no masses, tenderness or lesions              Bartholins and Skenes: normal                 Vagina: normal appearing vagina with normal color and discharge, no lesions              Cervix: no lesions, well healed from her leep  Chaperone was present for exam.  ASSESSMENT H/O cervical dysplasia S/P leep in 8/19, detached fragment with dysplasia, rest of specimen with negative margins, negative ECC    PLAN Pap with hpv   An After Visit Summary was printed and given to the patient.

## 2018-06-12 ENCOUNTER — Other Ambulatory Visit: Payer: Self-pay

## 2018-06-12 ENCOUNTER — Other Ambulatory Visit (HOSPITAL_COMMUNITY)
Admission: RE | Admit: 2018-06-12 | Discharge: 2018-06-12 | Disposition: A | Discharge status: Home / Self Care | Payer: 59 | Source: Ambulatory Visit | Attending: Obstetrics and Gynecology | Admitting: Obstetrics and Gynecology

## 2018-06-12 ENCOUNTER — Encounter: Payer: Self-pay | Admitting: Obstetrics and Gynecology

## 2018-06-12 ENCOUNTER — Ambulatory Visit (INDEPENDENT_AMBULATORY_CARE_PROVIDER_SITE_OTHER): Payer: 59 | Admitting: Obstetrics and Gynecology

## 2018-06-12 VITALS — BP 122/72 | HR 88 | Resp 16 | Ht 62.0 in | Wt 293.0 lb

## 2018-06-12 DIAGNOSIS — Z124 Encounter for screening for malignant neoplasm of cervix: Secondary | ICD-10-CM

## 2018-06-12 DIAGNOSIS — Z87410 Personal history of cervical dysplasia: Secondary | ICD-10-CM

## 2018-06-13 LAB — CYTOLOGY - PAP
Diagnosis: NEGATIVE
HPV: NOT DETECTED

## 2018-08-02 ENCOUNTER — Other Ambulatory Visit: Payer: Self-pay

## 2018-08-02 ENCOUNTER — Encounter: Payer: Self-pay | Admitting: Adult Health

## 2018-08-02 ENCOUNTER — Ambulatory Visit (INDEPENDENT_AMBULATORY_CARE_PROVIDER_SITE_OTHER): Payer: 59 | Admitting: Adult Health

## 2018-08-02 VITALS — Temp 98.1°F | Ht 62.0 in | Wt 297.0 lb

## 2018-08-02 DIAGNOSIS — F329 Major depressive disorder, single episode, unspecified: Secondary | ICD-10-CM | POA: Diagnosis not present

## 2018-08-02 DIAGNOSIS — F32A Depression, unspecified: Secondary | ICD-10-CM

## 2018-08-02 DIAGNOSIS — F419 Anxiety disorder, unspecified: Secondary | ICD-10-CM

## 2018-08-02 MED ORDER — LORAZEPAM 1 MG PO TABS: 1.0000 mg | ORAL_TABLET | Freq: Three times a day (TID) | ORAL | 0 refills | Status: DC | PRN

## 2018-08-02 MED ORDER — PAROXETINE HCL 20 MG PO TABS: ORAL_TABLET | ORAL | 0 refills | Status: DC

## 2018-08-02 NOTE — Assessment & Plan Note (Signed)
Assessment and Plan: Start SSRI- Paroxetine 20mg : 1/2 tab QD for one week, then one full tab QD [facility] Controlled Substance Database reviewed- No aberrancies noted Lorazepam 1mg - 1/2- 1 tab QHS PRN for anxiety/insomnia Increase regular exercise- recommend at least 30 minutes daily, 5 days per week of walking, jogging, biking, swimming, YouTube/Pinterest workout videos. Urgent referral  COVID-19 Education: Signs and symptoms of COVID-19 infection were discussed with pt and how to seek care for testing.  The importance of following the Stay at Home order, and when out- Social Distancing and wearing a facial mask were discussed today.  Follow Up Instructions: 4 weeks telemedicine ,sooner if needed    I discussed the assessment and treatment plan with the patient. The patient was provided an opportunity to ask questions and all were answered. The patient agreed with the plan and demonstrated an understanding of the instructions.   The patient was advised to call back or seek an in-person evaluation if the symptoms worsen or if the condition fails to improve as anticipated.
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
## 2018-08-02 NOTE — Assessment & Plan Note (Signed)
Assessment and Plan: Start SSRI- Paroxetine 20mg: 1/2 tab QD for one week, then one full tab QD [place] Controlled Substance Database reviewed- No aberrancies noted Lorazepam 1mg- 1/2- 1 tab QHS PRN for anxiety/insomnia Increase regular exercise- recommend at least 30 minutes daily, 5 days per week of walking, jogging, biking, swimming, YouTube/Pinterest workout videos. Urgent referral  COVID-19 Education: Signs and symptoms of COVID-19 infection were discussed with pt and how to seek care for testing.  The importance of following the Stay at Home order, and when out- Social Distancing and wearing a facial mask were discussed today.  Follow Up Instructions: 4 weeks telemedicine ,sooner if needed    I discussed the assessment and treatment plan with the patient. The patient was provided an opportunity to ask questions and all were answered. The patient agreed with the plan and demonstrated an understanding of the instructions.   The patient was advised to call back or seek an in-person evaluation if the symptoms worsen or if the condition fails to improve as anticipated.   

## 2018-08-19 ENCOUNTER — Other Ambulatory Visit: Payer: Self-pay | Admitting: Obstetrics and Gynecology

## 2018-08-22 NOTE — Telephone Encounter (Signed)
Medication refill request: Lisa Logan 0.35 mg Last AEX:  10/07/2017 Next AEX: Due in 11/2018 for 6 month pap Last MMG (if hormonal medication request): N/A Refill authorized: Lisa Logan 0.35 mg #3 1RF sent to pharmacy on file until patient is due for aex

## 2018-08-25 ENCOUNTER — Ambulatory Visit (INDEPENDENT_AMBULATORY_CARE_PROVIDER_SITE_OTHER): Payer: 59 | Admitting: Psychology

## 2018-08-25 DIAGNOSIS — F331 Major depressive disorder, recurrent, moderate: Secondary | ICD-10-CM | POA: Diagnosis not present

## 2018-09-04 ENCOUNTER — Ambulatory Visit (INDEPENDENT_AMBULATORY_CARE_PROVIDER_SITE_OTHER): Payer: 59 | Admitting: Adult Health

## 2018-09-04 ENCOUNTER — Other Ambulatory Visit: Payer: Self-pay

## 2018-09-04 ENCOUNTER — Encounter: Payer: Self-pay | Admitting: Adult Health

## 2018-09-04 DIAGNOSIS — F419 Anxiety disorder, unspecified: Secondary | ICD-10-CM

## 2018-09-04 DIAGNOSIS — G4700 Insomnia, unspecified: Secondary | ICD-10-CM | POA: Diagnosis not present

## 2018-09-04 DIAGNOSIS — F329 Major depressive disorder, single episode, unspecified: Secondary | ICD-10-CM | POA: Diagnosis not present

## 2018-09-04 DIAGNOSIS — F32A Depression, unspecified: Secondary | ICD-10-CM

## 2018-09-04 MED ORDER — PAROXETINE HCL 20 MG PO TABS: ORAL_TABLET | ORAL | 2 refills | Status: DC

## 2018-09-04 MED ORDER — PAROXETINE HCL 20 MG PO TABS: ORAL_TABLET | ORAL | 0 refills | Status: DC

## 2018-09-04 NOTE — Assessment & Plan Note (Signed)
Assessment and Plan: Continue Paroxetine 20mg  QD Increase regular exercise- YouTube vidoes, when able purchase elliptical. Stop Lorazpem for insomnia  Continue to limit caffeine Practice Sleep Hygiene  Trazodone not recommended with Paroxetine  Will try on Lunesta 2mg - will send in first thing tomorrow (unable to send in controlled rx from home) Continue with therapist as directed. Continue to abstain from tobacco/vape/ETOH use  Follow Up Instructions: 4 weeks CPE, fasting labs the week prior   I discussed the assessment and treatment plan with the patient. The patient was provided an opportunity to ask questions and all were answered. The patient agreed with the plan and demonstrated an understanding of the instructions.   The patient was advised to call back or seek an in-person evaluation if the symptoms worsen or if the condition fails to improve as anticipated.

## 2018-09-04 NOTE — Assessment & Plan Note (Signed)
[place] Controlled Substance Database reviewed- no aberrancies noted Lorazepam did not treat sx's Sleep Hygiene promoted Will try Lunesta 2mg 

## 2018-09-04 NOTE — Assessment & Plan Note (Signed)
Assessment and Plan: Continue Paroxetine 20mg QD Increase regular exercise- YouTube vidoes, when able purchase elliptical. Stop Lorazpem for insomnia  Continue to limit caffeine Practice Sleep Hygiene  Trazodone not recommended with Paroxetine  Will try on Lunesta 2mg- will send in first thing tomorrow (unable to send in controlled rx from home) Continue with therapist as directed. Continue to abstain from tobacco/vape/ETOH use  Follow Up Instructions: 4 weeks CPE, fasting labs the week prior   I discussed the assessment and treatment plan with the patient. The patient was provided an opportunity to ask questions and all were answered. The patient agreed with the plan and demonstrated an understanding of the instructions.   The patient was advised to call back or seek an in-person evaluation if the symptoms worsen or if the condition fails to improve as anticipated. 
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
## 2018-09-05 ENCOUNTER — Telehealth: Payer: Self-pay

## 2018-09-05 ENCOUNTER — Other Ambulatory Visit: Payer: Self-pay | Admitting: Adult Health

## 2018-09-05 MED ORDER — ESZOPICLONE 2 MG PO TABS: 2.0000 mg | ORAL_TABLET | Freq: Every evening | ORAL | 0 refills | Status: DC | PRN

## 2018-09-05 MED ORDER — PAROXETINE HCL 20 MG PO TABS: ORAL_TABLET | ORAL | 2 refills | Status: DC

## 2018-09-05 NOTE — Telephone Encounter (Signed)
Received fax from [facility] for clarification of Paxil directions.  Please review and advise.  Charyl Bigger, CMA

## 2018-09-05 NOTE — Addendum Note (Signed)
Addended by: Mina Marble D on: 09/05/2018 08:05 AM   Modules accepted: Orders

## 2018-09-06 ENCOUNTER — Ambulatory Visit: Payer: Self-pay | Admitting: Psychology

## 2018-09-13 ENCOUNTER — Ambulatory Visit (INDEPENDENT_AMBULATORY_CARE_PROVIDER_SITE_OTHER): Payer: 59 | Admitting: Psychology

## 2018-09-13 DIAGNOSIS — F330 Major depressive disorder, recurrent, mild: Secondary | ICD-10-CM | POA: Diagnosis not present

## 2018-09-25 ENCOUNTER — Encounter: Payer: Self-pay | Admitting: Adult Health

## 2018-09-27 ENCOUNTER — Other Ambulatory Visit: Payer: Self-pay | Admitting: Adult Health

## 2018-09-27 MED ORDER — ZOLPIDEM TARTRATE 5 MG PO TABS: 5.0000 mg | ORAL_TABLET | Freq: Every evening | ORAL | 0 refills | Status: DC | PRN

## 2018-10-04 ENCOUNTER — Ambulatory Visit (INDEPENDENT_AMBULATORY_CARE_PROVIDER_SITE_OTHER): Payer: 59 | Admitting: Psychology

## 2018-10-04 DIAGNOSIS — F330 Major depressive disorder, recurrent, mild: Secondary | ICD-10-CM | POA: Diagnosis not present

## 2018-10-27 ENCOUNTER — Encounter: Payer: Self-pay | Admitting: Adult Health

## 2018-11-27 ENCOUNTER — Other Ambulatory Visit: Payer: Self-pay

## 2018-11-27 DIAGNOSIS — Z20822 Contact with and (suspected) exposure to covid-19: Secondary | ICD-10-CM

## 2018-11-29 LAB — NOVEL CORONAVIRUS, NAA: SARS-CoV-2, NAA: NOT DETECTED

## 2019-01-21 ENCOUNTER — Other Ambulatory Visit: Payer: Self-pay | Admitting: Obstetrics and Gynecology

## 2019-02-06 ENCOUNTER — Other Ambulatory Visit: Payer: Self-pay | Admitting: Obstetrics and Gynecology

## 2019-02-07 NOTE — Telephone Encounter (Signed)
Message left to return call to [facility] at [phone].   Patient overdue for aex/6 month pap. Needs to schedule for refills.

## 2019-02-12 ENCOUNTER — Other Ambulatory Visit: Payer: Self-pay

## 2019-02-13 ENCOUNTER — Ambulatory Visit (INDEPENDENT_AMBULATORY_CARE_PROVIDER_SITE_OTHER): Payer: 59 | Admitting: Obstetrics and Gynecology

## 2019-02-13 ENCOUNTER — Encounter: Payer: Self-pay | Admitting: Obstetrics and Gynecology

## 2019-02-13 VITALS — BP 110/80 | HR 84 | Temp 97.2°F | Wt 280.0 lb

## 2019-02-13 DIAGNOSIS — N760 Acute vaginitis: Secondary | ICD-10-CM

## 2019-02-13 DIAGNOSIS — R300 Dysuria: Secondary | ICD-10-CM | POA: Diagnosis not present

## 2019-02-13 LAB — POCT URINALYSIS DIPSTICK
Bilirubin, UA: NEGATIVE
Blood, UA: POSITIVE
Glucose, UA: NEGATIVE
Ketones, UA: NEGATIVE
Nitrite, UA: NEGATIVE
Protein, UA: NEGATIVE
Spec Grav, UA: 1.01 (ref 1.010–1.025)
Urobilinogen, UA: 0.2 E.U./dL
pH, UA: 5 (ref 5.0–8.0)

## 2019-02-13 MED ORDER — METRONIDAZOLE 500 MG PO TABS: 500.0000 mg | ORAL_TABLET | Freq: Two times a day (BID) | ORAL | 0 refills | Status: DC

## 2019-02-13 MED ORDER — BETAMETHASONE VALERATE 0.1 % EX OINT: 1.0000 "application " | TOPICAL_OINTMENT | Freq: Two times a day (BID) | CUTANEOUS | 0 refills | Status: DC

## 2019-02-13 NOTE — Progress Notes (Signed)
GYNECOLOGY  VISIT   HPI: 28 y.o.   Married White or Caucasian Not Hispanic or Latino  female   [id number] with No LMP recorded. (Menstrual status: Oral contraceptives).   here for vaginal itching and irritation. Symptoms started ~2 weeks ago. The itching is a constant annoyance. No vaginal discharge, maybe a slight odor.  She c/o urinary frequency, urgency and dysuria for the last 2 weeks. Her urinary symptoms wax and wane. No abdominal or flank pain, no fevers.   GYNECOLOGIC HISTORY: No LMP recorded. (Menstrual status: Oral contraceptives). Contraception:OCP Menopausal hormone therapy: None        OB History    Gravida  0   Para  0   Term  0   Preterm  0   AB  0   Living  0     SAB  0   TAB  0   Ectopic  0   Multiple  0   Live Births  0              Patient Active Problem List   Diagnosis Date Noted  . Insomnia 09/04/2018  . Myalgia 02/01/2018  . Fever 02/01/2018  . Pharyngitis 02/01/2018  . Cough 02/01/2018  . Elevated LFTs 09/14/2017  . 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 Amenorrhea 04/11/2017  . Encounter for examination following treatment at hospital 04/11/2017  . Depression 03/30/2017  . Morbid obesity ([place]) 02/15/2017  . Healthcare maintenance 02/15/2017  . Migraine 07/14/2016  . Abdominal cramping 07/14/2016  . Anxiety 07/14/2016    Past Medical History:  Diagnosis Date  . Abnormal Pap smear of cervix   . Anxiety   . Arm fracture   . C. difficile colitis   . Depression   . HPV in female   . Migraine   . Obesity   . Vitamin D deficiency     Past Surgical History:  Procedure Laterality Date  . NO PAST SURGERIES      Current Outpatient Medications  Medication Sig Dispense Refill  . ERRIN 0.35 MG tablet TAKE 1 TABLET BY MOUTH  DAILY 3 Package 1  . ibuprofen (ADVIL,MOTRIN) 800 MG tablet Take 1 tablet (800 mg total) by mouth every 8 (eight) hours as needed. 30 tablet 1  . lidocaine (XYLOCAINE) 5 % ointment Apply a pea sized amount topically  20 minutes prior to intercourse, wipe off just prior to intercourse. 30 g 0   No current facility-administered medications for this visit.      ALLERGIES: Culturelle [lactobacillus] and Penicillins  Family History  Problem Relation Age of Onset  . Arthritis Mother   . Mental illness Mother   . Cancer Mother        skin   . Migraines Mother   . Depression Mother   . Alcohol abuse Father   . Hypertension Father   . Mental illness Father   . Depression Father   . Cancer Maternal Aunt        Bone  . Depression Maternal Aunt   . Lung cancer Maternal Aunt   . Arthritis Maternal Grandmother   . Stroke Maternal Grandmother   . Mental illness Maternal Grandmother   . Skin cancer Maternal Grandmother   . Cancer Maternal Grandmother        skin  . Cancer Maternal Grandfather        lung  . Mental illness Maternal Grandfather   . Cancer Paternal Grandmother        Skin  . Depression Sister   .  Depression Brother   . Cancer Maternal Uncle        skin  . Depression Maternal Uncle     Social History   Socioeconomic History  . Marital status: Married    Spouse name: Not on file  . Number of children: 0  . Years of education: 4112  . Highest education level: Not on file  Occupational History  . Occupation: [employer]  Social Needs  . Financial resource strain: Not on file  . Food insecurity    Worry: Not on file    Inability: Not on file  . Transportation needs    Medical: Not on file    Non-medical: Not on file  Tobacco Use  . Smoking status: Never Smoker  . Smokeless tobacco: Never Used  Substance and Sexual Activity  . Alcohol use: Yes    Comment: occassional  . Drug use: No  . Sexual activity: Yes    Birth control/protection: Pill  Lifestyle  . Physical activity    Days per week: Not on file    Minutes per session: Not on file  . Stress: Not on file  Relationships  . Social Musicianconnections    Talks on phone: Not on file    Gets together: Not on file    Attends  religious service: Not on file    Active member of club or organization: Not on file    Attends meetings of clubs or organizations: Not on file    Relationship status: Not on file  . Intimate partner violence    Fear of current or ex partner: Not on file    Emotionally abused: Not on file    Physically abused: Not on file    Forced sexual activity: Not on file  Other Topics Concern  . 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   HENT: Negative.   Eyes: Negative.   Respiratory: Negative.   Cardiovascular: Negative.   Gastrointestinal: Negative.   Genitourinary: Positive for dysuria.       Vaginal itching  Musculoskeletal: Negative.   Skin: Negative.   Neurological: Negative.   Endo/Heme/Allergies: Negative.   Psychiatric/Behavioral: Negative.     PHYSICAL EXAMINATION:    BP 110/80 (BP Location: Right Arm, Patient Position: Sitting, Cuff Size: Large)   Pulse 84   Temp (!) 97.2 F (36.2 C) (Skin)   Wt 280 lb (127 kg)   BMI 51.21 kg/m     General appearance: alert, cooperative and appears stated age  Pelvic: External genitalia:  no lesions              Urethra:  normal appearing urethra with no masses, tenderness or lesions              Bartholins and Skenes: normal                 Vagina: slightly erythematous appearing vagina with a slight increase in watery, white vaginal discharge              Cervix: no cervical motion tenderness and no lesions              Bimanual Exam:  Uterus:  no masses or tenderness              Adnexa: no mass, fullness, tenderness  Chaperone was present for exam.  Wet prep: + clue, no trich, + wbc KOH: no yeast PH: 4.5  Urine dip: trace blood and 1 + leuk.   ASSESSMENT Bacterial vaginitis WBC also on slides UTI symptoms, mild, wax and wane    PLAN Treat with oral flagyl Steroid ointment for vulvar  pruritis  Urine for ua, c&s. Will hold on treatment for now, she will call if symptoms worsen.    An After Visit Summary was printed and given to the patient.  Over 15 minutes face to face time of which over 50% was spent in counseling.

## 2019-02-13 NOTE — Patient Instructions (Signed)
Urinary Tract Infection, Adult  A urinary tract infection (UTI) is an infection of any part of the urinary tract. The urinary tract includes the kidneys, ureters, bladder, and urethra. These organs make, store, and get rid of urine in the body. Your health care provider may use other names to describe the infection. An upper UTI affects the ureters and kidneys (pyelonephritis). A lower UTI affects the bladder (cystitis) and urethra (urethritis). What are the causes? Most urinary tract infections are caused by bacteria in your genital area, around the entrance to your urinary tract (urethra). These bacteria grow and cause inflammation of your urinary tract. What increases the risk? You are more likely to develop this condition if:  You have a urinary catheter that stays in place (indwelling).  You are not able to control when you urinate or have a bowel movement (you have incontinence).  You are female and you: ? Use a spermicide or diaphragm for birth control. ? Have low estrogen levels. ? Are pregnant.  You have certain genes that increase your risk (genetics).  You are sexually active.  You take antibiotic medicines.  You have a condition that causes your flow of urine to slow down, such as: ? An enlarged prostate, if you are female. ? Blockage in your urethra (stricture). ? A kidney stone. ? A nerve condition that affects your bladder control (neurogenic bladder). ? Not getting enough to drink, or not urinating often.  You have certain medical conditions, such as: ? Diabetes. ? A weak disease-fighting system (immunesystem). ? Sickle cell disease. ? Gout. ? Spinal cord injury. What are the signs or symptoms? Symptoms of this condition include:  Needing to urinate right away (urgently).  Frequent urination or passing small amounts of urine frequently.  Pain or burning with urination.  Blood in the urine.  Urine that smells bad or unusual.  Trouble urinating.  Cloudy  urine.  Vaginal discharge, if you are female.  Pain in the abdomen or the lower back. You may also have:  Vomiting or a decreased appetite.  Confusion.  Irritability or tiredness.  A fever.  Diarrhea. The first symptom in older adults may be confusion. In some cases, they may not have any symptoms until the infection has worsened. How is this diagnosed? This condition is diagnosed based on your medical history and a physical exam. You may also have other tests, including:  Urine tests.  Blood tests.  Tests for sexually transmitted infections (STIs). If you have had more than one UTI, a cystoscopy or imaging studies may be done to determine the cause of the infections. How is this treated? Treatment for this condition includes:  Antibiotic medicine.  Over-the-counter medicines to treat discomfort.  Drinking enough water to stay hydrated. If you have frequent infections or have other conditions such as a kidney stone, you may need to see a health care provider who specializes in the urinary tract (urologist). In rare cases, urinary tract infections can cause sepsis. Sepsis is a life-threatening condition that occurs when the body responds to an infection. Sepsis is treated in the hospital with IV antibiotics, fluids, and other medicines. Follow these instructions at home:  Medicines  Take over-the-counter and prescription medicines only as told by your health care provider.  If you were prescribed an antibiotic medicine, take it as told by your health care provider. Do not stop using the antibiotic even if you start to feel better. General instructions  Make sure you: ? Empty your bladder often and   completely. Do not hold urine for long periods of time. ? Empty your bladder after sex. ? Wipe from front to back after a bowel movement if you are female. Use each tissue one time when you wipe.  Drink enough fluid to keep your urine pale yellow.  Keep all follow-up  visits as told by your health care provider. This is important. Contact a health care provider if:  Your symptoms do not get better after 1-2 days.  Your symptoms go away and then return. Get help right away if you have:  Severe pain in your back or your lower abdomen.  A fever.  Nausea or vomiting. Summary  A urinary tract infection (UTI) is an infection of any part of the urinary tract, which includes the kidneys, ureters, bladder, and urethra.  Most urinary tract infections are caused by bacteria in your genital area, around the entrance to your urinary tract (urethra).  Treatment for this condition often includes antibiotic medicines.  If you were prescribed an antibiotic medicine, take it as told by your health care provider. Do not stop using the antibiotic even if you start to feel better.  Keep all follow-up visits as told by your health care provider. This is important. This information is not intended to replace advice given to you by your health care provider. Make sure you discuss any questions you have with your health care provider. Document Released: 12/23/2004 Document Revised: 03/02/2018 Document Reviewed: 09/22/2017 Elsevier Patient Education  2020 [employer] Vaginitis Vaginitis is a condition in which the vaginal tissue swells and becomes red (inflamed). This condition is most often caused by a change in the normal balance of bacteria and yeast that live in the vagina. This change causes an overgrowth of certain bacteria or yeast, which causes the inflammation. There are different types of vaginitis, but the most common types are:  Bacterial vaginosis.  Yeast infection (candidiasis).  Trichomoniasis vaginitis. This is a sexually transmitted disease (STD).  Viral vaginitis.  Atrophic vaginitis.  Allergic vaginitis. What are the causes? The cause of this condition depends on the type of vaginitis. It can be caused by:  Bacteria (bacterial vaginosis).   Yeast, which is a fungus (yeast infection).  A parasite (trichomoniasis vaginitis).  A virus (viral vaginitis).  Low hormone levels (atrophic vaginitis). Low hormone levels can occur during pregnancy, breastfeeding, or after menopause.  Irritants, such as bubble baths, scented tampons, and feminine sprays (allergic vaginitis). Other factors can change the normal balance of the yeast and bacteria that live in the vagina. These include:  Antibiotic medicines.  Poor hygiene.  Diaphragms, vaginal sponges, spermicides, birth control pills, and intrauterine devices (IUD).  Sex.  Infection.  Uncontrolled diabetes.  A weakened defense (immune) system. What increases the risk? This condition is more likely to develop in women who:  Smoke.  Use vaginal douches, scented tampons, or scented sanitary pads.  Wear tight-fitting pants.  Wear thong underwear.  Use oral birth control pills or an IUD.  Have sex without a condom.  Have multiple sex partners.  Have an STD.  Frequently use the spermicide nonoxynol-9.  Eat lots of foods high in sugar.  Have uncontrolled diabetes.  Have low estrogen levels.  Have a weakened immune system from an immune disorder or medical treatment.  Are pregnant or breastfeeding. What are the signs or symptoms? Symptoms vary depending on the cause of the vaginitis. Common symptoms include:  Abnormal vaginal discharge. ? The discharge is white, gray, or yellow with bacterial   vaginosis. ? The discharge is thick, white, and cheesy with a yeast infection. ? The discharge is frothy and yellow or greenish with trichomoniasis.  A bad vaginal smell. The smell is fishy with bacterial vaginosis.  Vaginal itching, pain, or swelling.  Sex that is painful.  Pain or burning when urinating. Sometimes there are no symptoms. How is this diagnosed? This condition is diagnosed based on your symptoms and medical history. A physical exam, including a  pelvic exam, will also be done. You may also have other tests, including:  Tests to determine the pH level (acidity or alkalinity) of your vagina.  A whiff test, to assess the odor that results when a sample of your vaginal discharge is mixed with a potassium hydroxide solution.  Tests of vaginal fluid. A sample will be examined under a microscope. How is this treated? Treatment varies depending on the type of vaginitis you have. Your treatment may include:  Antibiotic creams or pills to treat bacterial vaginosis and trichomoniasis.  Antifungal medicines, such as vaginal creams or suppositories, to treat a yeast infection.  Medicine to ease discomfort if you have viral vaginitis. Your sexual partner should also be treated.  Estrogen delivered in a cream, pill, suppository, or vaginal ring to treat atrophic vaginitis. If vaginal dryness occurs, lubricants and moisturizing creams may help. You may need to avoid scented soaps, sprays, or douches.  Stopping use of a product that is causing allergic vaginitis. Then using a vaginal cream to treat the symptoms. Follow these instructions at home: Lifestyle  Keep your genital area clean and dry. Avoid soap, and only rinse the area with water.  Do not douche or use tampons until your health care provider says it is okay to do so. Use sanitary pads, if needed.  Do not have sex until your health care provider approves. When you can return to sex, practice safe sex and use condoms.  Wipe from front to back. This avoids the spread of bacteria from the rectum to the vagina. General instructions  Take over-the-counter and prescription medicines only as told by your health care provider.  If you were prescribed an antibiotic medicine, take or use it as told by your health care provider. Do not stop taking or using the antibiotic even if you start to feel better.  Keep all follow-up visits as told by your health care provider. This is important.  How is this prevented?  Use mild, non-scented products. Do not use things that can irritate the vagina, such as fabric softeners. Avoid the following products if they are scented: ? Feminine sprays. ? Detergents. ? Tampons. ? Feminine hygiene products. ? Soaps or bubble baths.  Let air reach your genital area. ? Wear cotton underwear to reduce moisture buildup. ? Avoid wearing underwear while you sleep. ? Avoid wearing tight pants and underwear or nylons without a cotton panel. ? Avoid wearing thong underwear.  Take off any wet clothing, such as bathing suits, as soon as possible.  Practice safe sex and use condoms. Contact a health care provider if:  You have abdominal pain.  You have a fever.  You have symptoms that last for more than 2-3 days. Get help right away if:  You have a fever and your symptoms suddenly get worse. Summary  Vaginitis is a condition in which the vaginal tissue becomes inflamed.This condition is most often caused by a change in the normal balance of bacteria and yeast that live in the vagina.  Treatment varies depending on   the type of vaginitis you have.  Do not douche, use tampons , or have sex until your health care provider approves. When you can return to sex, practice safe sex and use condoms. This information is not intended to replace advice given to you by your health care provider. Make sure you discuss any questions you have with your health care provider. Document Released: 01/10/2007 Document Revised: 02/25/2017 Document Reviewed: 04/20/2016 Elsevier Patient Education  2020 Elsevier Inc.  

## 2019-02-14 LAB — URINALYSIS, MICROSCOPIC ONLY
Casts: NONE SEEN /lpf
WBC, UA: >30 /hpf — AB (ref 0–5)

## 2019-02-14 LAB — VAGINITIS/VAGINOSIS, DNA PROBE
Candida Species: NEGATIVE
Gardnerella vaginalis: POSITIVE — AB
Trichomonas vaginosis: NEGATIVE

## 2019-02-16 LAB — URINE CULTURE

## 2019-02-17 ENCOUNTER — Encounter: Payer: Self-pay | Admitting: Obstetrics and Gynecology

## 2019-02-17 ENCOUNTER — Other Ambulatory Visit: Payer: Self-pay | Admitting: Obstetrics and Gynecology

## 2019-02-17 MED ORDER — SULFAMETHOXAZOLE-TRIMETHOPRIM 800-160 MG PO TABS: 1.0000 | ORAL_TABLET | Freq: Two times a day (BID) | ORAL | 0 refills | Status: DC

## 2019-02-19 ENCOUNTER — Telehealth: Payer: Self-pay | Admitting: Obstetrics and Gynecology

## 2019-02-19 NOTE — Telephone Encounter (Signed)
Lisa Logan  [facility]  Phone Number: [phone]        [place]. I just saw your message about me maybe having a UTI. I've been taking the antibiotics you prescribed after the last office visit and the pain and other symptoms have already gone away completely. Do I still need to get the new antibiotic or is it fine now?

## 2019-02-19 NOTE — Progress Notes (Signed)
28 y.o. G0P0000 Married White or Caucasian Not Hispanic or Latino female here for annual exam.  She is on POP, no cycles. No longer having pain with intercourse.     No LMP recorded. (Menstrual status: Oral contraceptives).          Sexually active: Yes.    The current method of family planning is POP. Exercising: Yes.    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  TDaP:  03/29/09 Gardasil: completed x3    reports that she has never smoked. She has never used smokeless tobacco. She reports current alcohol use. She reports that she does not use drugs. Homemaker. Drinks 2 glasses of wine a week.   Past Medical History:  Diagnosis Date  . Abnormal Pap smear of cervix   . Anxiety   . Arm fracture   . C. difficile colitis   . Depression   . HPV in female   . Migraine   . Obesity   . Vitamin D deficiency   Depression waxes and wanes. Currently better.   Past Surgical History:  Procedure Laterality Date  . NO PAST SURGERIES      Current Outpatient Medications  Medication Sig Dispense Refill  . betamethasone valerate ointment (VALISONE) 0.1 % Apply 1 application topically 2 (two) times daily. 30 g 0  . ERRIN 0.35 MG tablet TAKE 1 TABLET BY MOUTH  DAILY 3 Package 1  . ibuprofen (ADVIL,MOTRIN) 800 MG tablet Take 1 tablet (800 mg total) by mouth every 8 (eight) hours as needed. 30 tablet 1  . lidocaine (XYLOCAINE) 5 % ointment Apply a pea sized amount topically 20 minutes prior to intercourse, wipe off just prior to intercourse. 30 g 0  . metroNIDAZOLE (FLAGYL) 500 MG tablet Take 1 tablet (500 mg total) by mouth 2 (two) times daily. 14 tablet 0  . sulfamethoxazole-trimethoprim (BACTRIM DS) 800-160 MG tablet Take 1 tablet by mouth 2 (two) times daily. One PO BID x 3 days 6 tablet 0   No current  facility-administered medications for this visit.     Family History  Problem Relation Age of Onset  . Arthritis Mother   . Mental illness Mother   . Cancer Mother        skin   . Migraines Mother   . Depression Mother   . Alcohol abuse Father   . Hypertension Father   . Mental illness Father   . Depression Father   . Cancer Maternal Aunt        Bone  . Depression Maternal Aunt   . Lung cancer Maternal Aunt   . Arthritis Maternal Grandmother   . Stroke Maternal Grandmother   . Mental illness Maternal Grandmother   . Skin cancer Maternal Grandmother   . Cancer Maternal Grandmother        skin  . Cancer Maternal Grandfather        lung  . Mental illness Maternal Grandfather   . Cancer Paternal Grandmother        Skin  . Depression Sister   . Depression Brother   . Cancer Maternal Uncle        skin  . Depression Maternal Uncle     Review of Systems  Constitutional: Negative.   HENT: Negative.   Eyes: Negative.   Respiratory:  Negative.   Cardiovascular: Negative.   Gastrointestinal: Negative.   Endocrine: Negative.   Genitourinary: Negative.   Musculoskeletal: Negative.   Skin: Negative.   Allergic/Immunologic: Negative.   Neurological: Negative.   Hematological: Negative.   Psychiatric/Behavioral: Negative.     Exam:   BP 110/78   Pulse 84   Temp (!) 97.1 F (36.2 C)   Ht 5\' 4"  (1.626 m)   Wt 280 lb 3.2 oz (127.1 kg)   BMI 48.10 kg/m   Weight change: @WEIGHTCHANGE @ Height:   Height: 5\' 4"  (162.6 cm)  Ht Readings from Last 3 Encounters:  02/20/19 5\' 4"  (1.626 m)  08/02/18 5\' 2"  (1.575 m)  06/12/18 5\' 2"  (1.575 m)    General appearance: alert, cooperative and appears stated age Head: Normocephalic, without obvious abnormality, atraumatic Neck: no adenopathy, supple, symmetrical, trachea midline and thyroid normal to inspection and palpation Lungs: clear to auscultation bilaterally Cardiovascular: regular rate and rhythm Breasts: normal appearance,  no masses or tenderness Abdomen: soft, non-tender; non distended,  no masses,  no organomegaly Extremities: extremities normal, atraumatic, no cyanosis or edema Skin: Skin color, texture, turgor normal. No rashes or lesions Lymph nodes: Cervical, supraclavicular, and axillary nodes normal. No abnormal inguinal nodes palpated Neurologic: Grossly normal   Pelvic: External genitalia:  no lesions              Urethra:  normal appearing urethra with no masses, tenderness or lesions              Bartholins and Skenes: normal                 Vagina: normal appearing vagina with normal color and discharge, no lesions              Cervix: no lesions               Bimanual Exam:  Uterus:  normal size, contour, position, consistency, mobility, non-tender and anteverted              Adnexa: no mass, fullness, tenderness               Rectovaginal: Confirms               Anus:  normal sphincter tone, no lesions  Chaperone was present for exam.  A:  Well Woman with normal exam  H/O migraines (previously with a[place])  Vit d def  BMI 48  P:   Pap with hpv  Discussed breast self exam  Discussed calcium and vit D intake  Continue POP  Screening labs, Vit d

## 2019-02-19 NOTE — Telephone Encounter (Signed)
I would wait until she is done with the bactrim. 24 hours later she can restart the flagyl.

## 2019-02-19 NOTE — Telephone Encounter (Signed)
Spoke with patient. Patient prescribed Flagyl for BV and Bactrim DS for UTI. Patient stopped flagyl on 11/22 per pharamsicist recommendations, still has a couple days remaining. Started bactrim on 11/22.  Patient asking if she will need to complete flagyl after bactrim or continue now?

## 2019-02-19 NOTE — Telephone Encounter (Signed)
Left message to call Sharee Pimple, RN at [facility] [phone].    02/13/19: tx for BV with Flagyl. UC positive for infection, bactrim DS sent to pharmacy on 02/17/19.

## 2019-02-19 NOTE — Telephone Encounter (Signed)
Call to patient, left detailed message, name identified on voicemail. Advised as seen below per Dr. Talbert Nan. Return call to office if any additional questions.    Encounter closed.

## 2019-02-20 ENCOUNTER — Encounter: Payer: Self-pay | Admitting: Obstetrics and Gynecology

## 2019-02-20 ENCOUNTER — Other Ambulatory Visit (HOSPITAL_COMMUNITY)
Admission: RE | Admit: 2019-02-20 | Discharge: 2019-02-20 | Disposition: A | Discharge status: Home / Self Care | Payer: 59 | Source: Ambulatory Visit | Attending: Obstetrics and Gynecology | Admitting: Obstetrics and Gynecology

## 2019-02-20 ENCOUNTER — Ambulatory Visit (INDEPENDENT_AMBULATORY_CARE_PROVIDER_SITE_OTHER): Payer: 59 | Admitting: Obstetrics and Gynecology

## 2019-02-20 ENCOUNTER — Other Ambulatory Visit: Payer: Self-pay

## 2019-02-20 VITALS — BP 110/78 | HR 84 | Temp 97.1°F | Ht 64.0 in | Wt 280.2 lb

## 2019-02-20 DIAGNOSIS — Z9889 Other specified postprocedural states: Secondary | ICD-10-CM | POA: Insufficient documentation

## 2019-02-20 DIAGNOSIS — Z01419 Encounter for gynecological examination (general) (routine) without abnormal findings: Secondary | ICD-10-CM

## 2019-02-20 DIAGNOSIS — Z124 Encounter for screening for malignant neoplasm of cervix: Secondary | ICD-10-CM

## 2019-02-20 DIAGNOSIS — Z87410 Personal history of cervical dysplasia: Secondary | ICD-10-CM | POA: Insufficient documentation

## 2019-02-20 DIAGNOSIS — E559 Vitamin D deficiency, unspecified: Secondary | ICD-10-CM

## 2019-02-20 DIAGNOSIS — Z6841 Body Mass Index (BMI) 40.0 and over, adult: Secondary | ICD-10-CM

## 2019-02-20 DIAGNOSIS — G43109 Migraine with aura, not intractable, without status migrainosus: Secondary | ICD-10-CM

## 2019-02-20 DIAGNOSIS — Z0000 Encounter for general adult medical examination without abnormal findings: Secondary | ICD-10-CM

## 2019-02-20 NOTE — Patient Instructions (Signed)
EXERCISE AND DIET:  We recommended that you start or continue a regular exercise program for good health. Regular exercise means any activity that makes your heart beat faster and makes you sweat.  We recommend exercising at least 30 minutes per day at least 3 days a week, preferably 4 or 5.  We also recommend a diet low in fat and sugar.  Inactivity, poor dietary choices and obesity can cause diabetes, heart attack, stroke, and kidney damage, among others.    ALCOHOL AND SMOKING:  Women should limit their alcohol intake to no more than 7 drinks/beers/glasses of wine (combined, not each!) per week. Moderation of alcohol intake to this level decreases your risk of breast cancer and liver damage. And of course, no recreational drugs are part of a healthy lifestyle.  And absolutely no smoking or even second hand smoke. Most people know smoking can cause heart and lung diseases, but did you know it also contributes to weakening of your bones? Aging of your skin?  Yellowing of your teeth and nails?  CALCIUM AND VITAMIN D:  Adequate intake of calcium and Vitamin D are recommended.  The recommendations for exact amounts of these supplements seem to change often, but generally speaking 1,000 mg of calcium (between diet and supplement) and 800 units of Vitamin D per day seems prudent. Certain women may benefit from higher intake of Vitamin D.  If you are among these women, your doctor will have told you during your visit.    PAP SMEARS:  Pap smears, to check for cervical cancer or precancers,  have traditionally been done yearly, although recent scientific advances have shown that most women can have pap smears less often.  However, every woman still should have a physical exam from her gynecologist every year. It will include a breast check, inspection of the vulva and vagina to check for abnormal growths or skin changes, a visual exam of the cervix, and then an exam to evaluate the size and shape of the uterus and  ovaries.  And after 27 years of age, a rectal exam is indicated to check for rectal cancers. We will also provide age appropriate advice regarding health maintenance, like when you should have certain vaccines, screening for sexually transmitted diseases, bone density testing, colonoscopy, mammograms, etc.   MAMMOGRAMS:  All women over 40 years old should have a yearly mammogram. Many facilities now offer a "3D" mammogram, which may cost around $50 extra out of pocket. If possible,  we recommend you accept the option to have the 3D mammogram performed.  It both reduces the number of women who will be called back for extra views which then turn out to be normal, and it is better than the routine mammogram at detecting truly abnormal areas.    COLON CANCER SCREENING: Now recommend starting at age 45. At this time colonoscopy is not covered for routine screening until 50. There are take home tests that can be done between 45-49.   COLONOSCOPY:  Colonoscopy to screen for colon cancer is recommended for all women at age 50.  We know, you hate the idea of the prep.  We agree, BUT, having colon cancer and not knowing it is worse!!  Colon cancer so often starts as a polyp that can be seen and removed at colonscopy, which can quite literally save your life!  And if your first colonoscopy is normal and you have no family history of colon cancer, most women don't have to have it again for   10 years.  Once every ten years, you can do something that may end up saving your life, right?  We will be happy to help you get it scheduled when you are ready.  Be sure to check your insurance coverage so you understand how much it will cost.  It may be covered as a preventative service at no cost, but you should check your particular policy.      Breast Self-Awareness Breast self-awareness means being familiar with how your breasts look and feel. It involves checking your breasts regularly and reporting any changes to your  health care provider. Practicing breast self-awareness is important. A change in your breasts can be a sign of a serious medical problem. Being familiar with how your breasts look and feel allows you to find any problems early, when treatment is more likely to be successful. All women should practice breast self-awareness, including women who have had breast implants. How to do a breast self-exam One way to learn what is normal for your breasts and whether your breasts are changing is to do a breast self-exam. To do a breast self-exam: Look for Changes  1. Remove all the clothing above your waist. 2. Stand in front of a mirror in a room with good lighting. 3. Put your hands on your hips. 4. Push your hands firmly downward. 5. Compare your breasts in the mirror. Look for differences between them (asymmetry), such as: ? Differences in shape. ? Differences in size. ? Puckers, dips, and bumps in one breast and not the other. 6. Look at each breast for changes in your skin, such as: ? Redness. ? Scaly areas. 7. Look for changes in your nipples, such as: ? Discharge. ? Bleeding. ? Dimpling. ? Redness. ? A change in position. Feel for Changes Carefully feel your breasts for lumps and changes. It is best to do this while lying on your back on the floor and again while sitting or standing in the shower or tub with soapy water on your skin. Feel each breast in the following way:  Place the arm on the side of the breast you are examining above your head.  Feel your breast with the other hand.  Start in the nipple area and make  inch (2 cm) overlapping circles to feel your breast. Use the pads of your three middle fingers to do this. Apply light pressure, then medium pressure, then firm pressure. The light pressure will allow you to feel the tissue closest to the skin. The medium pressure will allow you to feel the tissue that is a little deeper. The firm pressure will allow you to feel the tissue  close to the ribs.  Continue the overlapping circles, moving downward over the breast until you feel your ribs below your breast.  Move one finger-width toward the center of the body. Continue to use the  inch (2 cm) overlapping circles to feel your breast as you move slowly up toward your collarbone.  Continue the up and down exam using all three pressures until you reach your armpit.  Write Down What You Find  Write down what is normal for each breast and any changes that you find. Keep a written record with breast changes or normal findings for each breast. By writing this information down, you do not need to depend only on memory for size, tenderness, or location. Write down where you are in your menstrual cycle, if you are still menstruating. If you are having trouble noticing differences   in your breasts, do not get discouraged. With time you will become more familiar with the variations in your breasts and more comfortable with the exam. How often should I examine my breasts? Examine your breasts every month. If you are breastfeeding, the best time to examine your breasts is after a feeding or after using a breast pump. If you menstruate, the best time to examine your breasts is 5-7 days after your period is over. During your period, your breasts are lumpier, and it may be more difficult to notice changes. When should I see my health care provider? See your health care provider if you notice:  A change in shape or size of your breasts or nipples.  A change in the skin of your breast or nipples, such as a reddened or scaly area.  Unusual discharge from your nipples.  A lump or thick area that was not there before.  Pain in your breasts.  Anything that concerns you.  

## 2019-02-21 LAB — VITAMIN D 25 HYDROXY (VIT D DEFICIENCY, FRACTURES): Vit D, 25-Hydroxy: 28.1 ng/mL — ABNORMAL LOW (ref 30.0–100.0)

## 2019-02-21 LAB — CBC
Hematocrit: 44.3 % (ref 34.0–46.6)
Hemoglobin: 13.6 g/dL (ref 11.1–15.9)
MCH: 26 pg — ABNORMAL LOW (ref 26.6–33.0)
MCHC: 30.7 g/dL — ABNORMAL LOW (ref 31.5–35.7)
MCV: 85 fL (ref 79–97)
Platelets: 290 10*3/uL (ref 150–450)
RBC: 5.24 x10E6/uL (ref 3.77–5.28)
RDW: 15.7 % — ABNORMAL HIGH (ref 11.7–15.4)
WBC: 9.6 10*3/uL (ref 3.4–10.8)

## 2019-02-21 LAB — COMPREHENSIVE METABOLIC PANEL
ALT: 17 IU/L (ref 0–32)
AST: 22 IU/L (ref 0–40)
Albumin/Globulin Ratio: 1.3 (ref 1.2–2.2)
Albumin: 4 g/dL (ref 3.9–5.0)
Alkaline Phosphatase: 105 IU/L (ref 39–117)
BUN/Creatinine Ratio: 12 (ref 9–23)
BUN: 11 mg/dL (ref 6–20)
Bilirubin Total: 0.3 mg/dL (ref 0.0–1.2)
CO2: 23 mmol/L (ref 20–29)
Calcium: 9.1 mg/dL (ref 8.7–10.2)
Chloride: 100 mmol/L (ref 96–106)
Creatinine, Ser: 0.92 mg/dL (ref 0.57–1.00)
GFR calc Af Amer: 99 mL/min/{1.73_m2} (ref >59)
GFR calc non Af Amer: 86 mL/min/{1.73_m2} (ref >59)
Globulin, Total: 3.1 g/dL (ref 1.5–4.5)
Glucose: 85 mg/dL (ref 65–99)
Potassium: 4.6 mmol/L (ref 3.5–5.2)
Sodium: 139 mmol/L (ref 134–144)
Total Protein: 7.1 g/dL (ref 6.0–8.5)

## 2019-02-21 LAB — HEMOGLOBIN A1C
Est. average glucose Bld gHb Est-mCnc: 105 mg/dL
Hgb A1c MFr Bld: 5.3 % (ref 4.8–5.6)

## 2019-02-21 LAB — LIPID PANEL
Chol/HDL Ratio: 3.3 ratio (ref 0.0–4.4)
Cholesterol, Total: 134 mg/dL (ref 100–199)
HDL: 41 mg/dL (ref >39)
LDL Chol Calc (NIH): 73 mg/dL (ref 0–99)
Triglycerides: 107 mg/dL (ref 0–149)
VLDL Cholesterol Cal: 20 mg/dL (ref 5–40)

## 2019-02-21 LAB — TSH: TSH: 2.59 u[IU]/mL (ref 0.450–4.500)

## 2019-02-26 LAB — CYTOLOGY - PAP
Comment: NEGATIVE
High risk HPV: POSITIVE — AB

## 2019-02-27 ENCOUNTER — Telehealth: Payer: Self-pay | Admitting: Obstetrics and Gynecology

## 2019-02-27 ENCOUNTER — Other Ambulatory Visit: Payer: Self-pay

## 2019-02-27 DIAGNOSIS — B977 Papillomavirus as the cause of diseases classified elsewhere: Secondary | ICD-10-CM

## 2019-02-27 DIAGNOSIS — R87612 Low grade squamous intraepithelial lesion on cytologic smear of cervix (LGSIL): Secondary | ICD-10-CM

## 2019-02-27 NOTE — Telephone Encounter (Signed)
Call placed to convey benefits for colposcopy. Spoke with patient and conveyed the benefits. Patient understands/ageeable with the benefits.Patient is aware of the cancellation policy. Appointment scheduled 03/09/19.

## 2019-03-06 NOTE — Progress Notes (Deleted)
GYNECOLOGY  VISIT   HPI: 28 y.o.   Married White or Caucasian Not Hispanic or Latino  female   G0P0000 with No LMP recorded. (Menstrual status: Oral contraceptives).   here for     GYNECOLOGIC HISTORY: No LMP recorded. (Menstrual status: Oral contraceptives). Contraception:*** Menopausal hormone therapy: ***        OB History    Gravida  0   Para  0   Term  0   Preterm  0   AB  0   Living  0     SAB  0   TAB  0   Ectopic  0   Multiple  0   Live Births  0              Patient Active Problem List   Diagnosis Date Noted  . Insomnia 09/04/2018  . Myalgia 02/01/2018  . Fever 02/01/2018  . Pharyngitis 02/01/2018  . Cough 02/01/2018  . Elevated LFTs 09/14/2017  . 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 Amenorrhea 04/11/2017  . Encounter for examination following treatment at hospital 04/11/2017  . Depression 03/30/2017  . Morbid obesity (HCC) 02/15/2017  . Healthcare maintenance 02/15/2017  . Migraine 07/14/2016  . Abdominal cramping 07/14/2016  . Anxiety 07/14/2016    Past Medical History:  Diagnosis Date  . Abnormal Pap smear of cervix   . Anxiety   . Arm fracture   . C. difficile colitis   . Depression   . HPV in female   . Migraine   . Obesity   . Vitamin D deficiency     Past Surgical History:  Procedure Laterality Date  . NO PAST SURGERIES      Current Outpatient Medications  Medication Sig Dispense Refill  . betamethasone valerate ointment (VALISONE) 0.1 % Apply 1 application topically 2 (two) times daily. 30 g 0  . ERRIN 0.35 MG tablet TAKE 1 TABLET BY MOUTH  DAILY 3 Package 1  . ibuprofen (ADVIL,MOTRIN) 800 MG tablet Take 1 tablet (800 mg total) by mouth every 8 (eight) hours as needed. 30 tablet 1  . lidocaine (XYLOCAINE) 5 % ointment Apply a pea sized amount topically 20 minutes prior to intercourse, wipe off just prior to intercourse. 30 g 0  . metroNIDAZOLE (FLAGYL) 500 MG tablet Take 1 tablet (500 mg total) by mouth 2 (two) times daily.  14 tablet 0  . sulfamethoxazole-trimethoprim (BACTRIM DS) 800-160 MG tablet Take 1 tablet by mouth 2 (two) times daily. One PO BID x 3 days 6 tablet 0   No current facility-administered medications for this visit.      ALLERGIES: Culturelle [lactobacillus] and Penicillins  Family History  Problem Relation Age of Onset  . Arthritis Mother   . Mental illness Mother   . Cancer Mother        skin   . Migraines Mother   . Depression Mother   . Alcohol abuse Father   . Hypertension Father   . Mental illness Father   . Depression Father   . Cancer Maternal Aunt        Bone  . Depression Maternal Aunt   . Lung cancer Maternal Aunt   . Arthritis Maternal Grandmother   . Stroke Maternal Grandmother   . Mental illness Maternal Grandmother   . Skin cancer Maternal Grandmother   . Cancer Maternal Grandmother        skin  . Cancer Maternal Grandfather        lung  . Mental  illness Maternal Grandfather   . Cancer Paternal Grandmother        Skin  . Depression Sister   . Depression Brother   . Cancer Maternal Uncle        skin  . Depression Maternal Uncle     Social History   Socioeconomic History  . Marital status: Married    Spouse name: Not on file  . Number of children: 0  . Years of education: 32  . Highest education level: Not on file  Occupational History  . Occupation: [employer]  Social Needs  . Financial resource strain: Not on file  . Food insecurity    Worry: Not on file    Inability: Not on file  . Transportation needs    Medical: Not on file    Non-medical: Not on file  Tobacco Use  . Smoking status: Never Smoker  . Smokeless tobacco: Never Used  Substance and Sexual Activity  . Alcohol use: Yes    Comment: occassional  . Drug use: No  . Sexual activity: Yes    Birth control/protection: Pill  Lifestyle  . Physical activity    Days per week: Not on file    Minutes per session: Not on file  . Stress: Not on file  Relationships  . Social Product manager on phone: Not on file    Gets together: Not on file    Attends religious service: Not on file    Active member of club or organization: Not on file    Attends meetings of clubs or organizations: Not on file    Relationship status: Not on file  . Intimate partner violence    Fear of current or ex partner: Not on file    Emotionally abused: Not on file    Physically abused: Not on file    Forced sexual activity: Not on file  Other Topics Concern  . Not on file  Social History Narrative   Married   Works at [employer], [employer]   Right-handed   Caffeine: tea, soda    ROS  PHYSICAL EXAMINATION:    There were no vitals taken for this visit.    General appearance: alert, cooperative and appears stated age Neck: no adenopathy, supple, symmetrical, trachea midline and thyroid {CHL AMB PHY EX THYROID NORM DEFAULT:[id number]::"normal to inspection and palpation"} Breasts: {Exam; breast:13139::"normal appearance, no masses or tenderness"} Abdomen: soft, non-tender; non distended, no masses,  no organomegaly  Pelvic: External genitalia:  no lesions              Urethra:  normal appearing urethra with no masses, tenderness or lesions              Bartholins and Skenes: normal                 Vagina: normal appearing vagina with normal color and discharge, no lesions              Cervix: {CHL AMB PHY EX CERVIX NORM DEFAULT:[phone]::"no lesions"}              Bimanual Exam:  Uterus:  {CHL AMB PHY EX UTERUS NORM DEFAULT:[phone]::"normal size, contour, position, consistency, mobility, non-tender"}              Adnexa: {CHL AMB PHY EX ADNEXA NO MASS DEFAULT:[phone]::"no mass, fullness, tenderness"}              Rectovaginal: {yes no:[id number]}.  Confirms.  Anus:  normal sphincter tone, no lesions  Chaperone was present for exam.  ASSESSMENT     PLAN    An After Visit Summary was printed and given to the patient.  *** minutes face to face time of which over 50% was  spent in counseling.

## 2019-03-07 ENCOUNTER — Other Ambulatory Visit: Payer: Self-pay

## 2019-03-08 ENCOUNTER — Telehealth: Payer: Self-pay

## 2019-03-08 ENCOUNTER — Telehealth: Payer: Self-pay | Admitting: Obstetrics and Gynecology

## 2019-03-08 NOTE — Telephone Encounter (Signed)
Patient cancelled procedure for tomorrow due to illness. PCP recommended her go to ED. Will call back when ready to reschedule.

## 2019-03-08 NOTE — Telephone Encounter (Signed)
Pt called stating that she has a fever of 102, ST, chest pain, SOB, and white pockets on her tonsils.  Advised pt to proceed to ED for evaluation/treatment and testing for COVID.  Pt expressed understanding and is agreeable.  Charyl Bigger, CMA

## 2019-03-09 ENCOUNTER — Ambulatory Visit: Payer: 59 | Admitting: Obstetrics and Gynecology

## 2019-03-14 ENCOUNTER — Other Ambulatory Visit: Payer: Self-pay

## 2019-03-14 ENCOUNTER — Telehealth: Payer: Self-pay | Admitting: Obstetrics and Gynecology

## 2019-03-14 NOTE — Telephone Encounter (Signed)
Spoke to pt. Pt wanting to reschedule colpo before holidays. Cancelled on 03/09/19 due to sickness. Negative Covid screening. Pt now rescheduled for colpo on 03/15/19 at 1:30 pm with Dr Talbert Nan. Pt instructed about taking Motrin 800 mg with food and water one hour before procedure. Pt agreeable.  Routing to provider for final review. Patient is agreeable to disposition. Will close encounter.

## 2019-03-14 NOTE — Telephone Encounter (Signed)
Patient left message on answering machine wanting a call to reschedule colposcopy.

## 2019-03-15 ENCOUNTER — Ambulatory Visit (INDEPENDENT_AMBULATORY_CARE_PROVIDER_SITE_OTHER): Payer: 59 | Admitting: Obstetrics and Gynecology

## 2019-03-15 ENCOUNTER — Other Ambulatory Visit (HOSPITAL_COMMUNITY)
Admission: RE | Admit: 2019-03-15 | Discharge: 2019-03-15 | Disposition: A | Discharge status: Home / Self Care | Payer: 59 | Source: Ambulatory Visit | Attending: Obstetrics and Gynecology | Admitting: Obstetrics and Gynecology

## 2019-03-15 ENCOUNTER — Encounter: Payer: Self-pay | Admitting: Obstetrics and Gynecology

## 2019-03-15 VITALS — BP 122/78 | HR 76 | Temp 97.8°F | Wt 277.0 lb

## 2019-03-15 DIAGNOSIS — R87612 Low grade squamous intraepithelial lesion on cytologic smear of cervix (LGSIL): Secondary | ICD-10-CM

## 2019-03-15 DIAGNOSIS — A630 Anogenital (venereal) warts: Secondary | ICD-10-CM | POA: Insufficient documentation

## 2019-03-15 DIAGNOSIS — Z01812 Encounter for preprocedural laboratory examination: Secondary | ICD-10-CM

## 2019-03-15 DIAGNOSIS — B977 Papillomavirus as the cause of diseases classified elsewhere: Secondary | ICD-10-CM

## 2019-03-15 LAB — POCT URINE PREGNANCY: Preg Test, Ur: NEGATIVE

## 2019-03-15 NOTE — Progress Notes (Signed)
GYNECOLOGY  VISIT   HPI: 28 y.o.   Married White or Caucasian Not Hispanic or Latino  female   G0P0000 with No LMP recorded. (Menstrual status: Oral contraceptives).   here for colposcopy.  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 Her f/u pap in 3/20 was negative with negative HPV. Her pap from last month returned with LSIL, +HPV  GYNECOLOGIC HISTORY: No LMP recorded. (Menstrual status: Oral contraceptives). Contraception:POP Menopausal hormone therapy: None        OB History    Gravida  0   Para  0   Term  0   Preterm  0   AB  0   Living  0     SAB  0   TAB  0   Ectopic  0   Multiple  0   Live Births  0              Patient Active Problem List   Diagnosis Date Noted  . Insomnia 09/04/2018  . Myalgia 02/01/2018  . Fever 02/01/2018  . Pharyngitis 02/01/2018  . Cough 02/01/2018  . Elevated LFTs 09/14/2017  . 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 Amenorrhea 04/11/2017  . Encounter for examination following treatment at hospital 04/11/2017  . Depression 03/30/2017  . Morbid obesity ([place]) 02/15/2017  . Healthcare maintenance 02/15/2017  . Migraine 07/14/2016  . Abdominal cramping 07/14/2016  . Anxiety 07/14/2016    Past Medical History:  Diagnosis Date  . Abnormal Pap smear of cervix   . Anxiety   . Arm fracture   . C. difficile colitis   . Depression   . HPV in female   . Migraine   . Obesity   . Vitamin D deficiency     Past Surgical History:  Procedure Laterality Date  . NO PAST SURGERIES      Current Outpatient Medications  Medication Sig Dispense Refill  . betamethasone valerate ointment (VALISONE) 0.1 % Apply 1 application topically 2 (two) times daily. 30 g 0  . ERRIN 0.35 MG tablet TAKE 1 TABLET BY MOUTH  DAILY 3 Package 1  . ibuprofen (ADVIL,MOTRIN) 800 MG tablet Take 1 tablet (800 mg total) by mouth every 8 (eight) hours as needed. 30 tablet 1  . lidocaine (XYLOCAINE) 5 % ointment  Apply a pea sized amount topically 20 minutes prior to intercourse, wipe off just prior to intercourse. 30 g 0   No current facility-administered medications for this visit.     ALLERGIES: Culturelle [lactobacillus] and Penicillins  Family History  Problem Relation Age of Onset  . Arthritis Mother   . Mental illness Mother   . Cancer Mother        skin   . Migraines Mother   . Depression Mother   . Alcohol abuse Father   . Hypertension Father   . Mental illness Father   . Depression Father   . Cancer Maternal Aunt        Bone  . Depression Maternal Aunt   . Lung cancer Maternal Aunt   . Arthritis Maternal Grandmother   . Stroke Maternal Grandmother   . Mental illness Maternal Grandmother   . Skin cancer Maternal Grandmother   . Cancer Maternal Grandmother        skin  . Cancer Maternal Grandfather        lung  . Mental illness Maternal Grandfather   . Cancer Paternal Grandmother  Skin  . Depression Sister   . Depression Brother   . Cancer Maternal Uncle        skin  . Depression Maternal Uncle     Social History   Socioeconomic History  . Marital status: Married    Spouse name: Not on file  . Number of children: 0  . Years of education: 23  . Highest education level: Not on file  Occupational History  . Occupation: [employer]  Tobacco Use  . Smoking status: Never Smoker  . Smokeless tobacco: Never Used  Substance and Sexual Activity  . Alcohol use: Yes    Comment: occassional  . Drug use: No  . Sexual activity: Yes    Birth control/protection: Pill  Other Topics Concern  . Not on file  Social History Narrative   Married   Works at [employer], HR   Right-handed   Caffeine: tea, soda   Social Determinants of Health   Financial Resource Strain:   . Difficulty of Paying Living Expenses: Not on file  Food Insecurity:   . Worried About Programme researcher, broadcasting/film/video in the Last Year: Not on file  . Ran Out of Food in the Last Year: Not on file  Transportation  Needs:   . Lack of Transportation (Medical): Not on file  . Lack of Transportation (Non-Medical): Not on file  Physical Activity:   . Days of Exercise per Week: Not on file  . Minutes of Exercise per Session: Not on file  Stress:   . Feeling of Stress : Not on file  Social Connections:   . Frequency of Communication with Friends and Family: Not on file  . Frequency of Social Gatherings with Friends and Family: Not on file  . Attends Religious Services: Not on file  . Active Member of Clubs or Organizations: Not on file  . Attends Banker Meetings: Not on file  . Marital Status: Not on file  Intimate Partner Violence:   . Fear of Current or Ex-Partner: Not on file  . Emotionally Abused: Not on file  . Physically Abused: Not on file  . Sexually Abused: Not on file    Review of Systems  Constitutional: Negative.   HENT: Negative.   Eyes: Negative.   Respiratory: Negative.   Cardiovascular: Negative.   Gastrointestinal: Negative.   Genitourinary: Negative.   Musculoskeletal: Negative.   Skin: Negative.   Neurological: Negative.   Endo/Heme/Allergies: Negative.   Psychiatric/Behavioral: Negative.     PHYSICAL EXAMINATION:    BP 122/78 (BP Location: Right Arm, Patient Position: Sitting, Cuff Size: Normal)   Pulse 76   Temp 97.8 F (36.6 C) (Skin)   Wt 277 lb (125.6 kg)   BMI 47.55 kg/m     General appearance: alert, cooperative and appears stated age  Pelvic: External genitalia:  no lesions              Urethra:  normal appearing urethra with no masses, tenderness or lesions              Bartholins and Skenes: normal                 Vagina: normal appearing vagina with normal color and discharge, no lesions              Cervix: no lesions  Colposcopy: not satisfactory, mild aceto-white changes at 1 o'clock, biopsy taken, ECC done. Lugols examination of the vagina with decreased lugols uptake in a few area's on the left,  biopsy at 3 o'clock. Small area of  decreased lugols at 10 o'clock, whole area removed. Silver nitrate used to treat biopsy sites  Chaperone, Kailtlyn Sprague, was present for exam.  ASSESSMENT LSIL pap with +HPV, prior h/o LEEP    PLAN Colposcopy with cervical and vaginal biopsies, ECC Further plans depending on results.    An After Visit Summary was printed and given to the patient.

## 2019-03-15 NOTE — Patient Instructions (Signed)

## 2019-03-19 ENCOUNTER — Telehealth: Payer: Self-pay | Admitting: Obstetrics and Gynecology

## 2019-03-19 LAB — SURGICAL PATHOLOGY

## 2019-03-19 NOTE — Telephone Encounter (Signed)
Lisa Dom, MD  03/19/2019 1:42 PM EST    Please let the patient know that her cervical biopsy was benign, the ECC showed HPV effect, one of the vaginal biopsies returned with HPV effect and the other with low grade dysplasia (VAIN I). She needs a f/u pap and hpv in one year.    Spoke with patient. Results given. Patient verbalizes understanding. 12 month recall entered.

## 2019-03-19 NOTE — Telephone Encounter (Signed)
Patient is returning call to [facility] regarding biopsy results.

## 2019-04-11 ENCOUNTER — Other Ambulatory Visit: Payer: Self-pay

## 2019-04-11 ENCOUNTER — Encounter (INDEPENDENT_AMBULATORY_CARE_PROVIDER_SITE_OTHER): Payer: Self-pay | Admitting: Bariatrics

## 2019-04-11 ENCOUNTER — Ambulatory Visit (INDEPENDENT_AMBULATORY_CARE_PROVIDER_SITE_OTHER): Payer: 59 | Admitting: Bariatrics

## 2019-04-11 VITALS — BP 102/72 | HR 79 | Temp 98.5°F | Ht 63.0 in | Wt 271.0 lb

## 2019-04-11 DIAGNOSIS — R7989 Other specified abnormal findings of blood chemistry: Secondary | ICD-10-CM

## 2019-04-11 DIAGNOSIS — Z1331 Encounter for screening for depression: Secondary | ICD-10-CM | POA: Diagnosis not present

## 2019-04-11 DIAGNOSIS — R0602 Shortness of breath: Secondary | ICD-10-CM

## 2019-04-11 DIAGNOSIS — G43109 Migraine with aura, not intractable, without status migrainosus: Secondary | ICD-10-CM

## 2019-04-11 DIAGNOSIS — E559 Vitamin D deficiency, unspecified: Secondary | ICD-10-CM

## 2019-04-11 DIAGNOSIS — E66813 Obesity, class 3: Secondary | ICD-10-CM

## 2019-04-11 DIAGNOSIS — Z0289 Encounter for other administrative examinations: Secondary | ICD-10-CM

## 2019-04-11 DIAGNOSIS — Z6841 Body Mass Index (BMI) 40.0 and over, adult: Secondary | ICD-10-CM

## 2019-04-11 DIAGNOSIS — Z9189 Other specified personal risk factors, not elsewhere classified: Secondary | ICD-10-CM | POA: Diagnosis not present

## 2019-04-11 DIAGNOSIS — R5383 Other fatigue: Secondary | ICD-10-CM

## 2019-04-11 DIAGNOSIS — G40802 Other epilepsy, not intractable, without status epilepticus: Secondary | ICD-10-CM

## 2019-04-11 MED ORDER — VITAMIN D (ERGOCALCIFEROL) 1.25 MG (50000 UNIT) PO CAPS: 50000.0000 [IU] | ORAL_CAPSULE | ORAL | 0 refills | Status: DC

## 2019-04-12 LAB — INSULIN, RANDOM: INSULIN: 9.8 u[IU]/mL (ref 2.6–24.9)

## 2019-04-16 ENCOUNTER — Encounter (INDEPENDENT_AMBULATORY_CARE_PROVIDER_SITE_OTHER): Payer: Self-pay | Admitting: Bariatrics

## 2019-04-16 NOTE — Progress Notes (Signed)
Dear Dr. Gertie Logan,   Thank you for referring Lisa Logan to our clinic. The following note includes my evaluation and treatment recommendations.  Chief Complaint:   OBESITY Lisa Logan (MR# [id number]) is a 29 y.o. female who presents for evaluation and treatment of obesity and related comorbidities. Current BMI is Body mass index is 48.01 kg/m.Marland Kitchen Lisa Logan has been struggling with her weight for many years and has been unsuccessful in either losing weight, maintaining weight loss, or reaching her healthy weight goal.  Lisa Logan is currently in the action stage of change and ready to dedicate time achieving and maintaining a healthier weight. Lisa Logan is interested in becoming our patient and working on intensive lifestyle modifications including (but not limited to) diet and exercise for weight loss.  Lisa Logan is lactose intolerant. She does like to cook, but notes obstacles as cleanup and boring food. She craves Asian cuisine. She dislikes seafood.  Lisa Logan's habits were reviewed today and are as follows: Her family eats meals together, she thinks her family will eat healthier with her, her desired weight loss is 111 lbs, she has been heavy most of her life, her heaviest weight ever was 298 pounds, she craves Asian cruisine, she skips meals frequently (only eats 1 meal a day), she frequently makes poor food choices, she maybe eats larger portions than normal and she struggles with emotional eating.  Depression Screen Lisa Logan's Food and Mood (modified PHQ-9) score was 11.  Depression screen PHQ 2/9 04/11/2019  Decreased Interest 1  Down, Depressed, Hopeless 2  PHQ - 2 Score 3  Altered sleeping 3  Tired, decreased energy 3  Change in appetite 1  Feeling bad or failure about yourself  1  Trouble concentrating 0  Moving slowly or fidgety/restless 0  Suicidal thoughts 0  PHQ-9 Score 11  Difficult doing work/chores Not difficult at all   Subjective:   Other fatigue. Lisa Logan denies  daytime somnolence and admits to waking up still tired. Lisa Logan generally gets 5-9 hours of sleep per night, and states that she generally has restful sleep. Snoring is present. Apneic episodes are not present. Epworth Sleepiness Score is 3.  Shortness of breath on exertion. Lisa Logan notes increasing shortness of breath with exercising and seems to be worsening over time with weight gain. She notes getting out of breath sooner with activity than she used to. This has gotten worse recently. Lisa Logan denies shortness of breath at rest or orthopnea.   Elevated LFTs. Lisa Logan has a new dx of elevated ALT. Her BMI is over 40. She denies abdominal pain or jaundice and has never been told of any liver problems in the past. She denies excessive alcohol intake. We reviewed CT scan of chest and abdomen from January 2019 which showed no evidence of fatty liver. Last ALT was 17 on 02/20/2019.  At risk for impaired function of liver. Lisa Logan is at risk for impaired function of liver due to likely diagnosis of fatty liver as evidenced by recent elevated liver enzymes.   Vitamin D deficiency. Last Vitamin D level 28.1 on 02/20/2019.  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 She reports no seizures since high school. Trigger is hard flashing lights. She is on no medications.  Migraine with aura and without status migrainosus, not intractable. Lisa Logan has migraines about 1 time per month. She used to see Neurology.  Depression screening. Depression screening was performed on today's visit.   Assessment/Plan:   Other fatigue. Lisa Logan does feel that her weight is causing  her energy to be lower than it should be. Fatigue may be related to obesity, depression or many other causes. Labs will be ordered, and in the meanwhile, Lisa Logan will focus on self care including making healthy food choices, increasing physical activity and focusing on stress reduction. EKG 12-Lead, Insulin, random  ordered.  Shortness of breath on exertion. Lisa Logan does feel that she gets out of breath more easily that she used to when she exercises. Lisa Logan's shortness of breath appears to be obesity related and exercise induced. She has agreed to work on weight loss and gradually increase exercise to treat her exercise induced shortness of breath. Will continue to monitor closely.  Elevated LFTs. We discussed the likely diagnosis of non-alcoholic fatty liver disease today and how this condition is obesity related. Lisa Logan was educated the importance of weight loss. Lisa Logan agreed to continue with her weight loss efforts with healthier diet and exercise as an essential part of her treatment plan. We will watch over time. She was instructed to lose 5-10% of her body fat and add cardio and resistance to her exercise regimen.  At risk for impaired function of liver. Lisa Logan was given approximately 15 minutes of counseling today regarding prevention of impaired liver function. Lisa Logan was educated about her risk of developing NASH or even liver failure and advised that the only proven treatment for NAFLD was weight loss of at least 5-10% of body weight.   Vitamin D deficiency. Low Vitamin D level contributes to fatigue and are associated with obesity, breast, and colon cancer. She agrees to take prescription Vitamin D, Ergocalciferol, (DRISDOL) 1.25 MG (50000 UNIT) CAPS capsule every week #4 with 0 refills and will follow-up for routine testing of Vitamin D, at least 2-3 times per year to avoid over-replacement.   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 Lisa Logan will follow-up with her PCP.  Migraine with aura and without status migrainosus, not intractable. Lisa Logan will follow-up with her PCP. She was advised that weight loss can help with her migraines.  Depression screening. Lisa Logan had a positive depression screening. Depression is commonly associated with obesity and often results in  emotional eating behaviors. We will monitor this closely and work on CBT to help improve the non-hunger eating patterns. Referral to Psychology may be required if no improvement is seen as she continues in our clinic.  Class 3 severe obesity with serious comorbidity and body mass index (BMI) of 45.0 to 49.9 in adult, unspecified obesity type ([place]).  Lisa Logan is currently in the action stage of change and her goal is to continue with weight loss efforts. I recommend Lisa Logan begin the structured treatment plan as follows:  She has agreed to the Category 4 Plan.  We reviewed labs today including CMP, lipids, Vitamin D, CBC and A1c from 02/20/2019. She will work on meal planning.  Exercise goals: For substantial health benefits, adults should do at least 150 minutes (2 hours and 30 minutes) a week of moderate-intensity, or 75 minutes (1 hour and 15 minutes) a week of vigorous-intensity aerobic physical activity, or an equivalent combination of moderate- and vigorous-intensity aerobic activity. Aerobic activity should be performed in episodes of at least 10 minutes, and preferably, it should be spread throughout the week. Adults should also include muscle-strengthening activities that involve all major muscle groups on 2 or more days a week.   Behavioral modification strategies: increasing lean protein intake, decreasing simple carbohydrates, increasing vegetables, increasing water intake, decreasing eating out, no skipping meals, meal planning and  cooking strategies, keeping healthy foods in the home and planning for success.  She was informed of the importance of frequent follow-up visits to maximize her success with intensive lifestyle modifications for her multiple health conditions. She was informed we would discuss her lab results at her next visit unless there is a critical issue that needs to be addressed sooner. Lisa Logan agreed to keep her next visit at the agreed upon time to discuss these  results.  Objective:   Blood pressure 102/72, pulse 79, temperature 98.5 F (36.9 C), height 5\' 3"  (1.6 m), weight 271 lb (122.9 kg), last menstrual period 10/27/2017, SpO2 98 %. Body mass index is 48.01 kg/m.  EKG: Sinus  Rhythm with a rate of 78 BPM. Low voltage in precordial leads most likely related to body habitus. Precordial QRS contours - nondiagnostic for this age. Otherwise normal.  Indirect Calorimeter completed today shows a VO2 of 324 and a REE of 2255.  Her calculated basal metabolic rate is 12/27/2017 thus her basal metabolic rate is better than expected.  General: Cooperative, alert, well developed, in no acute distress. HEENT: Conjunctivae and lids unremarkable. Cardiovascular: Regular rhythm.  Lungs: Normal work of breathing. Neurologic: No focal deficits.   Lab Results  Component Value Date   CREATININE 0.92 02/20/2019   BUN 11 02/20/2019   NA 139 02/20/2019   K 4.6 02/20/2019   CL 100 02/20/2019   CO2 23 02/20/2019   Lab Results  Component Value Date   ALT 17 02/20/2019   AST 22 02/20/2019   ALKPHOS 105 02/20/2019   BILITOT 0.3 02/20/2019   Lab Results  Component Value Date   HGBA1C 5.3 02/20/2019   HGBA1C 5.1 08/29/2017   Lab Results  Component Value Date   INSULIN 9.8 04/11/2019   Lab Results  Component Value Date   TSH 2.590 02/20/2019   Lab Results  Component Value Date   CHOL 134 02/20/2019   HDL 41 02/20/2019   LDLCALC 73 02/20/2019   TRIG 107 02/20/2019   CHOLHDL 3.3 02/20/2019   Lab Results  Component Value Date   WBC 9.6 02/20/2019   HGB 13.6 02/20/2019   HCT 44.3 02/20/2019   MCV 85 02/20/2019   PLT 290 02/20/2019   Lab Results  Component Value Date   IRON 93 10/06/2017   IRON 93 10/06/2017   FERRITIN 141 12/05/2017   Attestation Statements:   Reviewed by clinician on day of visit: allergies, medications, problem list, medical history, surgical history, family history, social history, and previous encounter notes.  02/04/2018, am acting as Fernanda Drum for Energy manager, DO   I have reviewed the above documentation for accuracy and completeness, and I agree with the above. [employer], DO

## 2019-04-24 ENCOUNTER — Encounter (INDEPENDENT_AMBULATORY_CARE_PROVIDER_SITE_OTHER): Payer: Self-pay | Admitting: Bariatrics

## 2019-04-24 DIAGNOSIS — E559 Vitamin D deficiency, unspecified: Secondary | ICD-10-CM | POA: Insufficient documentation

## 2019-04-25 ENCOUNTER — Ambulatory Visit (INDEPENDENT_AMBULATORY_CARE_PROVIDER_SITE_OTHER): Payer: 59 | Admitting: Bariatrics

## 2019-04-25 ENCOUNTER — Other Ambulatory Visit: Payer: Self-pay

## 2019-04-25 ENCOUNTER — Encounter (INDEPENDENT_AMBULATORY_CARE_PROVIDER_SITE_OTHER): Payer: Self-pay | Admitting: Bariatrics

## 2019-04-25 VITALS — BP 102/74 | HR 75 | Temp 98.6°F | Ht 63.0 in | Wt 266.0 lb

## 2019-04-25 DIAGNOSIS — E559 Vitamin D deficiency, unspecified: Secondary | ICD-10-CM | POA: Diagnosis not present

## 2019-04-25 DIAGNOSIS — R7989 Other specified abnormal findings of blood chemistry: Secondary | ICD-10-CM

## 2019-04-25 DIAGNOSIS — Z6841 Body Mass Index (BMI) 40.0 and over, adult: Secondary | ICD-10-CM | POA: Diagnosis not present

## 2019-04-25 NOTE — Progress Notes (Signed)
Chief Complaint:   Lisa Logan is here to discuss her progress with her obesity treatment plan along with follow-up of her obesity related diagnoses. Lisa Logan is on the Category 4 Plan and states she is following her eating plan approximately 98% of the time. Lisa Logan states she is walking 20 minutes 1-2 times per week.  Today's visit was #: 2 Starting weight: 271 lbs Starting date: 04/11/2019 Today's weight: 266 lbs Today's date: 04/25/2019 Total lbs lost to date: 5 Total lbs lost since last in-office visit: 5  Interim History: Lisa Logan is down 5 lbs. She states that the plan is limited, but doable. She is not having any cravings.  Subjective:   Vitamin D deficiency. Lisa Logan is on high dose Vitamin D. Last Vitamin D level was 28.1 on 02/20/2019.  Elevated LFTs. Lisa Logan has a new dx of elevated ALT. Her BMI is over 40. She denies abdominal pain or jaundice and has never been told of any liver problems in the past. She denies excessive alcohol intake. AST/ALT normal at this time. No abdominal pain.  Lab Results  Component Value Date   ALT 17 02/20/2019   AST 22 02/20/2019   ALKPHOS 105 02/20/2019   BILITOT 0.3 02/20/2019   Assessment/Plan:   Vitamin D deficiency. Low Vitamin D level contributes to fatigue and are associated with obesity, breast, and colon cancer. She agrees to continue to take Vitamin D and will follow-up for routine testing of Vitamin D, at least 2-3 times per year to avoid over-replacement.  Elevated LFTs. We discussed the likely diagnosis of non-alcoholic fatty liver disease today and how this condition is obesity related. Lisa Logan was educated the importance of weight loss. Lisa Logan agreed to continue with her weight loss efforts with healthier diet and exercise as an essential part of her treatment plan. She will increase activity (resistance and cardio) and will continue to work on weight loss.  Class 3 severe obesity with serious comorbidity and  body mass index (BMI) of 45.0 to 49.9 in adult, unspecified obesity type (Lisa Logan).  Lisa Logan is currently in the action stage of change. As such, her goal is to continue with weight loss efforts. She has agreed to the Category 4 Plan.   We reviewed labs with her including CMP, lipids, Vitamin D, CBC, A1c, and TSH.  She will increase her water intake over time. She was given additional lunch and dinner options.  Exercise goals: Lisa Logan will continue to walk as above and increase over time.  Behavioral modification strategies: increasing lean protein intake, decreasing simple carbohydrates, increasing vegetables, increasing water intake, decreasing eating out, no skipping meals, meal planning and cooking strategies, keeping healthy foods in the home and planning for success.  Lisa Logan has agreed to follow-up with our clinic in 2 weeks. She was informed of the importance of frequent follow-up visits to maximize her success with intensive lifestyle modifications for her multiple health conditions.   Objective:   Blood pressure 102/74, pulse 75, temperature 98.6 F (37 C), height 5\' 3"  (1.6 m), weight 266 lb (120.7 kg), SpO2 98 %. Body mass index is 47.12 kg/m.  General: Cooperative, alert, well developed, in no acute distress. HEENT: Conjunctivae and lids unremarkable. Cardiovascular: Regular rhythm.  Lungs: Normal work of breathing. Neurologic: No focal deficits.   Lab Results  Component Value Date   CREATININE 0.92 02/20/2019   BUN 11 02/20/2019   NA 139 02/20/2019   K 4.6 02/20/2019   CL 100 02/20/2019   CO2 23  02/20/2019   Lab Results  Component Value Date   ALT 17 02/20/2019   AST 22 02/20/2019   ALKPHOS 105 02/20/2019   BILITOT 0.3 02/20/2019   Lab Results  Component Value Date   HGBA1C 5.3 02/20/2019   HGBA1C 5.1 08/29/2017   Lab Results  Component Value Date   INSULIN 9.8 04/11/2019   Lab Results  Component Value Date   TSH 2.590 02/20/2019   Lab Results    Component Value Date   CHOL 134 02/20/2019   HDL 41 02/20/2019   LDLCALC 73 02/20/2019   TRIG 107 02/20/2019   CHOLHDL 3.3 02/20/2019   Lab Results  Component Value Date   WBC 9.6 02/20/2019   HGB 13.6 02/20/2019   HCT 44.3 02/20/2019   MCV 85 02/20/2019   PLT 290 02/20/2019   Lab Results  Component Value Date   IRON 93 10/06/2017   IRON 93 10/06/2017   FERRITIN 141 12/05/2017   Attestation Statements:   Reviewed by clinician on day of visit: allergies, medications, problem list, medical history, surgical history, family history, social history, and previous encounter notes.  Time spent on visit including pre-visit chart review and post-visit care was 30 minutes.   Lisa Logan, am acting as Energy manager for [employer], DO   I have reviewed the above documentation for accuracy and completeness, and I agree with the above. Corinna Capra, DO

## 2019-05-02 ENCOUNTER — Other Ambulatory Visit: Payer: Self-pay

## 2019-05-02 MED ORDER — NORETHINDRONE 0.35 MG PO TABS: 1.0000 | ORAL_TABLET | Freq: Every day | ORAL | 2 refills | Status: DC

## 2019-05-02 NOTE — Telephone Encounter (Signed)
Received faxed refill request from [facility] for the following:  Medication refill request: [place] Last AEX: 02-20-19 Next AEX: none Last MMG (if hormonal medication request): n/a Refill authorized: please refill if appropriate

## 2019-05-10 ENCOUNTER — Encounter (INDEPENDENT_AMBULATORY_CARE_PROVIDER_SITE_OTHER): Payer: Self-pay | Admitting: Bariatrics

## 2019-05-10 ENCOUNTER — Other Ambulatory Visit: Payer: Self-pay

## 2019-05-10 ENCOUNTER — Ambulatory Visit (INDEPENDENT_AMBULATORY_CARE_PROVIDER_SITE_OTHER): Payer: 59 | Admitting: Bariatrics

## 2019-05-10 VITALS — BP 106/72 | HR 82 | Temp 98.8°F | Ht 63.0 in | Wt 264.0 lb

## 2019-05-10 DIAGNOSIS — G4709 Other insomnia: Secondary | ICD-10-CM | POA: Diagnosis not present

## 2019-05-10 DIAGNOSIS — Z6841 Body Mass Index (BMI) 40.0 and over, adult: Secondary | ICD-10-CM

## 2019-05-10 DIAGNOSIS — E559 Vitamin D deficiency, unspecified: Secondary | ICD-10-CM | POA: Diagnosis not present

## 2019-05-10 DIAGNOSIS — Z9189 Other specified personal risk factors, not elsewhere classified: Secondary | ICD-10-CM

## 2019-05-10 MED ORDER — SAXENDA 18 MG/3ML ~~LOC~~ SOPN: 3.0000 mg | PEN_INJECTOR | Freq: Every day | SUBCUTANEOUS | 0 refills | Status: DC

## 2019-05-10 MED ORDER — VITAMIN D (ERGOCALCIFEROL) 1.25 MG (50000 UNIT) PO CAPS: 50000.0000 [IU] | ORAL_CAPSULE | ORAL | 0 refills | Status: DC

## 2019-05-10 MED ORDER — BD PEN NEEDLE NANO 2ND GEN 32G X 4 MM MISC: 1.0000 | Freq: Two times a day (BID) | 0 refills | Status: DC

## 2019-05-10 NOTE — Progress Notes (Signed)
Chief Complaint:   Lisa Logan is here to discuss her progress with her obesity treatment plan along with follow-up of her obesity related diagnoses. Lisa Logan is on the Category 4 Plan and states she is following her eating plan approximately 70% of the time. Lisa Logan states she is walking 15 minutes 1-3 times per week.  Today's visit was #: 3 Starting weight: 271 lbs Starting date: 04/11/2019 Today's weight: 264 lbs Today's date: 05/10/2019 Total lbs lost to date: 7 Total lbs lost since last in-office visit: 2  Interim History: Lisa Logan is down 2 lbs. She is also getting bored with the food and wants to change some of her food options.  Subjective:   Other insomnia. Lisa Logan has tried melatonin. Insomnia is slightly better.  Vitamin D deficiency. Lisa Logan is taking prescription Vitamin D. Last Vitamin D level 28.1 on 02/20/2019.  At risk for osteoporosis. Lisa Logan is at higher risk of osteopenia and osteoporosis due to Vitamin D deficiency.   Assessment/Plan:   Other insomnia. The problem of recurrent insomnia was discussed. Orders and follow up as documented in patient record. Counseling: Intensive lifestyle modifications are the first line treatment for this issue. We discussed several lifestyle modifications today and she will continue to work on diet, exercise and weight loss efforts. We discussed sleep hygiene.  Counseling  Limit or avoid alcohol, caffeinated beverages, and cigarettes, especially close to bedtime.   Do not eat a large meal or eat spicy foods right before bedtime. This can lead to digestive discomfort that can make it hard for you to sleep.  Keep a sleep diary to help you and your health care provider figure out what could be causing your insomnia.  . Make your bedroom a dark, comfortable place where it is easy to fall asleep. ? Put up shades or blackout curtains to block light from outside. ? Use a white noise machine to block noise. ? Keep the  temperature cool. . Limit screen use before bedtime. This includes: ? Watching TV. ? Using your smartphone, tablet, or computer. . Stick to a routine that includes going to bed and waking up at the same times every day and night. This can help you fall asleep faster. Consider making a quiet activity, such as reading, part of your nighttime routine. . Try to avoid taking naps during the day so that you sleep better at night. . Get out of bed if you are still awake after 15 minutes of trying to sleep. Keep the lights down, but try reading or doing a quiet activity. When you feel sleepy, go back to bed.  Vitamin D deficiency. Low Vitamin D level contributes to fatigue and are associated with obesity, breast, and colon cancer. She was given a prescription for Vitamin D, Ergocalciferol, (DRISDOL) 1.25 MG (50000 UNIT) CAPS capsule every week  #4 with 0 refills and will follow-up for routine testing of Vitamin D, at least 2-3 times per year to avoid over-replacement.   At risk for osteoporosis. Lisa Logan was given approximately 15 minutes of osteoporosis prevention counseling today. Lisa Logan is at risk for osteopenia and osteoporosis due to her Vitamin D deficiency. She was encouraged to take her Vitamin D and follow her higher calcium diet and increase strengthening exercise to help strengthen her bones and decrease her risk of osteopenia and osteoporosis.  Repetitive spaced learning was employed today to elicit superior memory formation and behavioral change.  Class 3 severe obesity with serious comorbidity and body mass index (BMI) of  45.0 to 49.9 in adult, unspecified obesity type (HCC).  Liraglutide - Weight Management (SAXENDA) 18 MG/3ML SOPN, Insulin Pen Needle (BD PEN NEEDLE NANO 2ND GEN) 32G X 4 MM MISC, 1 month supply with 0 refills.   Lisa Logan is currently in the action stage of change. As such, her goal is to continue with weight loss efforts. She has agreed to the Category 4 Plan and will journal  550-700 calories and 45+ grams of protein at supper.   She will work on meal planning.  She was given a refill on insulin pens 32 g x 4 mm #100 with 0 refills.  Exercise goals: All adults should avoid inactivity. Some physical activity is better than none, and adults who participate in any amount of physical activity gain some health benefits.  Behavioral modification strategies: increasing lean protein intake, decreasing simple carbohydrates, increasing vegetables, increasing water intake, decreasing eating out, no skipping meals, meal planning and cooking strategies, keeping healthy foods in the home and planning for success.  Lisa Logan has agreed to follow-up with our clinic in 2 weeks. She was informed of the importance of frequent follow-up visits to maximize her success with intensive lifestyle modifications for her multiple health conditions.   Objective:   Blood pressure 106/72, pulse 82, temperature 98.8 F (37.1 C), height 5\' 3"  (1.6 m), weight 264 lb (119.7 kg), SpO2 99 %. Body mass index is 46.77 kg/m.  General: Cooperative, alert, well developed, in no acute distress. HEENT: Conjunctivae and lids unremarkable. Cardiovascular: Regular rhythm.  Lungs: Normal work of breathing. Neurologic: No focal deficits.   Lab Results  Component Value Date   CREATININE 0.92 02/20/2019   BUN 11 02/20/2019   NA 139 02/20/2019   K 4.6 02/20/2019   CL 100 02/20/2019   CO2 23 02/20/2019   Lab Results  Component Value Date   ALT 17 02/20/2019   AST 22 02/20/2019   ALKPHOS 105 02/20/2019   BILITOT 0.3 02/20/2019   Lab Results  Component Value Date   HGBA1C 5.3 02/20/2019   HGBA1C 5.1 08/29/2017   Lab Results  Component Value Date   INSULIN 9.8 04/11/2019   Lab Results  Component Value Date   TSH 2.590 02/20/2019   Lab Results  Component Value Date   CHOL 134 02/20/2019   HDL 41 02/20/2019   LDLCALC 73 02/20/2019   TRIG 107 02/20/2019   CHOLHDL 3.3 02/20/2019   Lab  Results  Component Value Date   WBC 9.6 02/20/2019   HGB 13.6 02/20/2019   HCT 44.3 02/20/2019   MCV 85 02/20/2019   PLT 290 02/20/2019   Lab Results  Component Value Date   IRON 93 10/06/2017   IRON 93 10/06/2017   FERRITIN 141 12/05/2017   Attestation Statements:   Reviewed by clinician on day of visit: allergies, medications, problem list, medical history, surgical history, family history, social history, and previous encounter notes.  02/04/2018, am acting as Fernanda Drum for Energy manager, DO   I have reviewed the above documentation for accuracy and completeness, and I agree with the above. [employer], DO

## 2019-05-13 ENCOUNTER — Encounter (INDEPENDENT_AMBULATORY_CARE_PROVIDER_SITE_OTHER): Payer: Self-pay | Admitting: Bariatrics

## 2019-05-14 NOTE — Telephone Encounter (Signed)
FYI

## 2019-05-24 ENCOUNTER — Ambulatory Visit (INDEPENDENT_AMBULATORY_CARE_PROVIDER_SITE_OTHER): Payer: 59 | Admitting: Bariatrics

## 2019-05-30 ENCOUNTER — Other Ambulatory Visit: Payer: Self-pay

## 2019-05-30 ENCOUNTER — Ambulatory Visit (INDEPENDENT_AMBULATORY_CARE_PROVIDER_SITE_OTHER): Payer: 59 | Admitting: Family Medicine

## 2019-05-30 ENCOUNTER — Encounter (INDEPENDENT_AMBULATORY_CARE_PROVIDER_SITE_OTHER): Payer: Self-pay | Admitting: Family Medicine

## 2019-05-30 VITALS — BP 100/72 | HR 83 | Temp 98.2°F | Ht 63.0 in | Wt 260.0 lb

## 2019-05-30 DIAGNOSIS — Z6841 Body Mass Index (BMI) 40.0 and over, adult: Secondary | ICD-10-CM

## 2019-05-30 DIAGNOSIS — Z9189 Other specified personal risk factors, not elsewhere classified: Secondary | ICD-10-CM | POA: Diagnosis not present

## 2019-05-30 DIAGNOSIS — E88819 Insulin resistance, unspecified: Secondary | ICD-10-CM

## 2019-05-30 DIAGNOSIS — E559 Vitamin D deficiency, unspecified: Secondary | ICD-10-CM

## 2019-05-30 DIAGNOSIS — E66813 Obesity, class 3: Secondary | ICD-10-CM

## 2019-05-30 DIAGNOSIS — E8881 Metabolic syndrome: Secondary | ICD-10-CM | POA: Diagnosis not present

## 2019-05-30 MED ORDER — VITAMIN D (ERGOCALCIFEROL) 1.25 MG (50000 UNIT) PO CAPS: 50000.0000 [IU] | ORAL_CAPSULE | ORAL | 0 refills | Status: DC

## 2019-05-30 NOTE — Progress Notes (Signed)
Chief Complaint:   Lisa Logan is here to discuss her progress with her obesity treatment plan along with follow-up of her obesity related diagnoses. Lisa Logan is on the Category 4 Plan and states she is following her eating plan approximately 60% of the time. Lisa Logan states she is doing cardio 10 minutes 4 times per week.  Today's visit was #: 4 Starting weight: 271 lbs Starting date: 04/11/2019 Today's weight: 260 lbs Today's date: 05/30/2019 Total lbs lost to date: 11 Total lbs lost since last in-office visit: 4  Interim History: Lisa Logan is in the process of moving so it has been more difficult to adhere to the plan. She has been skipping some meals. She started Saxenda and titrated up too quickly and went beyond the 0.6 mg dose which resulted in flushing and nausea so she stopped the Saxenda.  Subjective:   Vitamin D deficiency. Last Vitamin D 28.1 on 02/20/2019. Lisa Logan is on prescription Vitamin D.  Insulin resistance. Lisa Logan has a diagnosis of insulin resistance based on her elevated fasting insulin level >5. She continues to work on diet and exercise to decrease her risk of diabetes. Lisa Logan is not on metformin. No polyphagia.  Lab Results  Component Value Date   INSULIN 9.8 04/11/2019   Lab Results  Component Value Date   HGBA1C 5.3 02/20/2019   At risk for diabetes mellitus. Lisa Logan is at higher than average risk for developing diabetes due to her obesity.   Assessment/Plan:   Vitamin D deficiency. Low Vitamin D level contributes to fatigue and are associated with obesity, breast, and colon cancer. She was given a prescription for Vitamin D, Ergocalciferol, (DRISDOL) 1.25 MG (50000 UNIT) CAPS capsule every week #4 with 0 refills and will follow-up for routine testing of Vitamin D, at least 2-3 times per year to avoid over-replacement.     Insulin resistance. Lisa Logan will continue to work on weight loss, exercise, and decreasing simple carbohydrates to help  decrease the risk of diabetes. Lisa Logan agreed to follow-up with [place] as directed to closely monitor her progress. She will continue the meal plan.  At risk for diabetes mellitus. Lisa Logan was given approximately 15 minutes of diabetes education and counseling today. We discussed intensive lifestyle modifications today with an emphasis on weight loss as well as increasing exercise and decreasing simple carbohydrates in her diet. We also reviewed medication options with an emphasis on risk versus benefit of those discussed.   Repetitive spaced learning was employed today to elicit superior memory formation and behavioral change.  Class 3 severe obesity with serious comorbidity and body mass index (BMI) of 45.0 to 49.9 in adult, unspecified obesity type ([place]).  Lisa Logan is currently in the action stage of change. As such, her goal is to continue with weight loss efforts. She has agreed to the Category 4 Plan and journaling 550-700 calories and 45 gms of protein at supper.   She will start Saxenda at 0.3 mg for one week and then go up to 0.6 mg. She will then stay at the 0.6 mg dose.  Exercise goals: No exercise has been prescribed at this time.  Behavioral modification strategies: increasing lean protein intake, no skipping meals, meal planning and cooking strategies and planning for success.  Lisa Logan has agreed to follow-up with our clinic in 2 weeks. She was informed of the importance of frequent follow-up visits to maximize her success with intensive lifestyle modifications for her multiple health conditions.   Objective:   Blood pressure 100/72,  pulse 83, temperature 98.2 F (36.8 C), temperature source Oral, height 5\' 3"  (1.6 m), weight 260 lb (117.9 kg), SpO2 97 %. Body mass index is 46.06 kg/m.  General: Cooperative, alert, well developed, in no acute distress. HEENT: Conjunctivae and lids unremarkable. Cardiovascular: Regular rhythm.  Lungs: Normal work of breathing. Neurologic: No  focal deficits.   Lab Results  Component Value Date   CREATININE 0.92 02/20/2019   BUN 11 02/20/2019   NA 139 02/20/2019   K 4.6 02/20/2019   CL 100 02/20/2019   CO2 23 02/20/2019   Lab Results  Component Value Date   ALT 17 02/20/2019   AST 22 02/20/2019   ALKPHOS 105 02/20/2019   BILITOT 0.3 02/20/2019   Lab Results  Component Value Date   HGBA1C 5.3 02/20/2019   HGBA1C 5.1 08/29/2017   Lab Results  Component Value Date   INSULIN 9.8 04/11/2019   Lab Results  Component Value Date   TSH 2.590 02/20/2019   Lab Results  Component Value Date   CHOL 134 02/20/2019   HDL 41 02/20/2019   LDLCALC 73 02/20/2019   TRIG 107 02/20/2019   CHOLHDL 3.3 02/20/2019   Lab Results  Component Value Date   WBC 9.6 02/20/2019   HGB 13.6 02/20/2019   HCT 44.3 02/20/2019   MCV 85 02/20/2019   PLT 290 02/20/2019   Lab Results  Component Value Date   IRON 93 10/06/2017   IRON 93 10/06/2017   FERRITIN 141 12/05/2017   Attestation Statements:   Reviewed by clinician on day of visit: allergies, medications, problem list, medical history, surgical history, family history, social history, and previous encounter notes.  ,02/04/2018, am acting as Fernanda Drum for Energy manager, FNP   I have reviewed the above documentation for accuracy and completeness, and I agree with the above. -  [place], FNP

## 2019-06-13 ENCOUNTER — Ambulatory Visit (INDEPENDENT_AMBULATORY_CARE_PROVIDER_SITE_OTHER): Payer: 59 | Admitting: Family Medicine

## 2019-06-16 ENCOUNTER — Ambulatory Visit: Payer: 59

## 2019-07-17 NOTE — Progress Notes (Signed)
Patient, No Pcp Per   Chief Complaint  Patient presents with  . Re-establish care    changing from her ob-gyn to [place], currently has no vaginitis/dysuria sx    HPI:      [name] is a 29 y.o. G0P0000 who LMP was No LMP recorded. (Menstrual status: Oral contraceptives)., presents today for NP establish care, changing GYNs. On POPs, amenorrheic. No dysmen. She is sex active, sometimes with vaginal pain if less lubricated. Sx resolved with lubricants. No FH breast/ovar/colon ca. No tobacco use, occas alcohol, no drug use. Occas exercise. She does get adequate calcium in her diet. Hx of Vit D deficiency, ran out of Rx Vit D, not taking OTC supp.  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 LEEP path with CIN1-2 with an unclear margin. 6 month repeat pas was LGSIL/pos HPV DNA 02/20/19. 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 Due for repeat pap in 1 yr but pt would prefer sooner. Pt had significant pain with colpo bx in past and had LEEP in office that was a traumatic experience for. Preferred to do in OP surg but not an option per pt.  Pt seen at urgent care recently for vaignal pain without increased d/c/odor. Treated for BV with flagyl, diflucan and UTI with macrobid. Sx resolved.   Past Medical History:  Diagnosis Date  . Abnormal Pap smear of cervix   . Anxiety   . Arm fracture   . C. difficile colitis   . Depression   . Epilepsy ([place])   . High grade squamous intraepithelial lesion (HGSIL), grade 3 CIN, on biopsy of cervix   . HPV in female   . Lactose intolerance   . Migraine   . Obesity   . SOB (shortness of breath)   . Vitamin D deficiency     Past Surgical History:  Procedure Laterality Date  . LEEP  2019   CIN 1-2, unclear margins    Family History  Problem Relation Age of Onset  . Arthritis Mother   . Mental illness Mother   . Cancer Mother        skin   . Migraines Mother   . Depression Mother   . High blood pressure Mother   .  Bipolar disorder Mother   . Obesity Mother   . Skin cancer Mother        not melanoma  . Alcohol abuse Father   . Hypertension Father   . Mental illness Father   . Depression Father   . Eating disorder Father   . Cancer Maternal Aunt        Bone  . Depression Maternal Aunt   . Lung cancer Maternal Aunt   . Arthritis Maternal Grandmother   . Stroke Maternal Grandmother   . Mental illness Maternal Grandmother   . Skin cancer Maternal Grandmother   . Cancer Maternal Grandmother        skin  . Cancer Maternal Grandfather        lung  . Mental illness Maternal Grandfather   . Cancer Paternal Grandmother        Skin  . Depression Sister   . Depression Brother   . Cancer Maternal Uncle        skin  . Depression Maternal Uncle     Social History   Socioeconomic History  . Marital status: Married    Spouse name: Marcello Moores  . Number of  children: 0  . Years of education: 69  . Highest education level: Not on file  Occupational History  . 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 Smoking status: Never Smoker  . Smokeless tobacco: Never Used  Substance and Sexual Activity  . Alcohol use: Yes    Comment: occassional  . Drug use: No  . Sexual activity: Yes    Birth control/protection: Pill  Other Topics Concern  . Not on file  Social History Narrative   Married   Works at [employer], [employer]   Right-handed   Caffeine: tea, soda   Social Determinants of Corporate investment banker Strain:   . Difficulty of Paying Living Expenses:   Food Insecurity:   . Worried About Programme researcher, broadcasting/film/video in the Last Year:   . Barista in the Last Year:   Transportation Needs:   . Freight forwarder (Medical):   Marland Kitchen Lack of Transportation (Non-Medical):   Physical Activity:   . Days of Exercise per Week:   . Minutes of Exercise per Session:   Stress:   . Feeling of Stress :   Social Connections:   . Frequency of Communication with Friends and Family:   . Frequency of Social  Gatherings with Friends and Family:   . Attends Religious Services:   . Active Member of Clubs or Organizations:   . Attends Banker Meetings:   Marland Kitchen Marital Status:   Intimate Partner Violence:   . Fear of Current or Ex-Partner:   . Emotionally Abused:   Marland Kitchen Physically Abused:   . Sexually Abused:     Outpatient Medications Prior to Visit  Medication Sig Dispense Refill  . betamethasone valerate ointment (VALISONE) 0.1 % Apply 1 application topically 2 (two) times daily. 30 g 0  . HYDROcodone-acetaminophen (NORCO/VICODIN) 5-325 MG tablet TAKE 1 TABLET BY MOUTH EVERY 6 HOURS AS NEEDED FOR DENTAL PAIN    . ibuprofen (ADVIL) 800 MG tablet Take 800 mg by mouth every 6 (six) hours as needed.    . lidocaine (XYLOCAINE) 5 % ointment Apply a pea sized amount topically 20 minutes prior to intercourse, wipe off just prior to intercourse. 30 g 0  . norethindrone (ERRIN) 0.35 MG tablet Take 1 tablet (0.35 mg total) by mouth daily. 3 Package 2  . Insulin Pen Needle (BD PEN NEEDLE NANO 2ND GEN) 32G X 4 MM MISC 1 Package by Does not apply route 2 (two) times daily. (Patient not taking: Reported on 07/18/2019) 100 each 0  . LORazepam (ATIVAN) 1 MG tablet Take 1 mg by mouth every 8 (eight) hours.    . Vitamin D, Ergocalciferol, (DRISDOL) 1.25 MG (50000 UNIT) CAPS capsule Take 1 capsule (50,000 Units total) by mouth every 7 (seven) days. 4 capsule 0   No facility-administered medications prior to visit.      ROS:  Review of Systems  Constitutional: Negative for fatigue, fever and unexpected weight change.  Respiratory: Negative for cough, shortness of breath and wheezing.   Cardiovascular: Negative for chest pain, palpitations and leg swelling.  Gastrointestinal: Negative for blood in stool, constipation, diarrhea, nausea and vomiting.  Endocrine: Negative for cold intolerance, heat intolerance and polyuria.  Genitourinary: Positive for dyspareunia. Negative for dysuria, flank pain,  frequency, genital sores, hematuria, menstrual problem, pelvic pain, urgency, vaginal bleeding, vaginal discharge and vaginal pain.  Musculoskeletal: Negative for back pain, joint swelling and myalgias.  Skin: Negative for rash.  Neurological: Negative for dizziness, syncope,  light-headedness, numbness and headaches.  Hematological: Negative for adenopathy.  Psychiatric/Behavioral: Positive for agitation and dysphoric mood. Negative for confusion, sleep disturbance and suicidal ideas. The patient is not nervous/anxious.   BREAST: No symptoms   OBJECTIVE:   Vitals:  BP 108/78   Ht 5\' 3"  (1.6 m)   Wt 258 lb (117 kg)   BMI 45.70 kg/m   Physical Exam Vitals reviewed.  Constitutional:      Appearance: She is well-developed.  Pulmonary:     Effort: Pulmonary effort is normal.  Genitourinary:    General: Normal vulva.     Pubic Area: No rash.      Labia:        Right: No rash, tenderness or lesion.        Left: No rash, tenderness or lesion.      Vagina: Normal. No vaginal discharge, erythema or tenderness.     Cervix: Normal.     Uterus: Normal. Not enlarged and not tender.      Adnexa: Right adnexa normal and left adnexa normal.       Right: No mass or tenderness.         Left: No mass or tenderness.    Musculoskeletal:        General: Normal range of motion.     Cervical back: Normal range of motion.  Skin:    General: Skin is warm and dry.  Neurological:     General: No focal deficit present.     Mental Status: She is alert and oriented to person, place, and time.  Psychiatric:        Mood and Affect: Mood normal.        Behavior: Behavior normal.        Thought Content: Thought content normal.        Judgment: Judgment normal.     Assessment/Plan: Cervical cancer screening - Plan: Cytology - PAP  Screening for HPV (human papillomavirus) - Plan: Cytology - PAP  LGSIL on Pap smear of cervix - Plan: Cytology - PAP; Repeat pap today. Will call with results and  dispo. Colpo path discussed with pt.   RTO when annual due, ~11/21.    Return in about 7 months (around 02/17/2020), or if symptoms worsen or fail to improve, for annual.  Arrin Pintor B. Shomari Scicchitano, PA-C 07/18/2019 10:02 AM

## 2019-07-18 ENCOUNTER — Other Ambulatory Visit: Payer: Self-pay

## 2019-07-18 ENCOUNTER — Other Ambulatory Visit (HOSPITAL_COMMUNITY)
Admission: RE | Admit: 2019-07-18 | Discharge: 2019-07-18 | Disposition: A | Discharge status: Home / Self Care | Payer: 59 | Source: Ambulatory Visit | Attending: Obstetrics and Gynecology | Admitting: Obstetrics and Gynecology

## 2019-07-18 ENCOUNTER — Encounter: Payer: Self-pay | Admitting: Obstetrics and Gynecology

## 2019-07-18 ENCOUNTER — Ambulatory Visit (INDEPENDENT_AMBULATORY_CARE_PROVIDER_SITE_OTHER): Payer: 59 | Admitting: Obstetrics and Gynecology

## 2019-07-18 VITALS — BP 108/78 | Ht 63.0 in | Wt 258.0 lb

## 2019-07-18 DIAGNOSIS — R87612 Low grade squamous intraepithelial lesion on cytologic smear of cervix (LGSIL): Secondary | ICD-10-CM | POA: Insufficient documentation

## 2019-07-18 DIAGNOSIS — Z1151 Encounter for screening for human papillomavirus (HPV): Secondary | ICD-10-CM | POA: Diagnosis not present

## 2019-07-18 DIAGNOSIS — Z124 Encounter for screening for malignant neoplasm of cervix: Secondary | ICD-10-CM

## 2019-07-18 NOTE — Patient Instructions (Signed)
I value your feedback and entrusting us with your care. If you get a [facility] patient survey, I would appreciate you taking the time to let us know about your experience today. Thank you!  As of March 08, 2019, your lab results will be released to your MyChart immediately, before I even have a chance to see them. Please give me time to review them and contact you if there are any abnormalities. Thank you for your patience.  

## 2019-07-23 LAB — CYTOLOGY - PAP
Comment: NEGATIVE
Comment: NEGATIVE
Comment: NEGATIVE
HPV 16: NEGATIVE
HPV 18 / 45: NEGATIVE
High risk HPV: POSITIVE — AB

## 2019-10-17 ENCOUNTER — Ambulatory Visit
Admission: EM | Admit: 2019-10-17 | Discharge: 2019-10-17 | Disposition: A | Discharge status: Home / Self Care | Payer: 59 | Attending: Emergency Medicine | Admitting: Emergency Medicine

## 2019-10-17 DIAGNOSIS — H1033 Unspecified acute conjunctivitis, bilateral: Secondary | ICD-10-CM

## 2019-10-17 MED ORDER — POLYMYXIN B-TRIMETHOPRIM 10000-0.1 UNIT/ML-% OP SOLN: 1.0000 [drp] | Freq: Four times a day (QID) | OPHTHALMIC | 0 refills | Status: AC

## 2019-10-17 MED ORDER — POLYMYXIN B-TRIMETHOPRIM 10000-0.1 UNIT/ML-% OP SOLN: 1.0000 [drp] | Freq: Four times a day (QID) | OPHTHALMIC | 0 refills | Status: DC

## 2019-10-17 NOTE — ED Triage Notes (Signed)
Patient reports she woke up this morning with crusty stuff on both eyes. Also reports itching.   Denies: light sensitivity  OTC: warm wet washcoth

## 2019-10-17 NOTE — Discharge Instructions (Addendum)
Use the antibiotic eyedrops as prescribed.    Follow-up with your eye doctor for a recheck in 1 to 2 days if your symptoms are not improving.    Go to the emergency department if you have acute eye pain or changes in your vision.    

## 2019-10-17 NOTE — ED Provider Notes (Signed)
Lisa Logan    CSN: [id number] Arrival date & time: 10/17/19  0818      History   Chief Complaint Chief Complaint  Patient presents with  . Eye Problem    HPI Lisa Logan is a 29 y.o. female.   Patient presents with green-yellow drainage and crusting on her eyelashes since early morning today.  She also reports itching and redness.  No known injury.  She denies acute eye pain or changes in her vision.  Treated at home with a warm washcloth.  She had cold symptoms last week but these have mostly resolved.  She has a residual nonproductive cough and nasal congestion.    The history is provided by the patient.    Past Medical History:  Diagnosis Date  . Abnormal Pap smear of cervix   . Anxiety   . Arm fracture   . C. difficile colitis   . Depression   . Epilepsy ([facility])   . High grade squamous intraepithelial lesion (HGSIL), grade 3 CIN, on biopsy of cervix   . HPV in female   . Lactose intolerance   . Migraine   . Obesity   . SOB (shortness of breath)   . Vitamin D deficiency     Patient Active Problem List   Diagnosis Date Noted  . Insulin resistance 05/30/2019  . Vitamin D deficiency 04/24/2019  . Insomnia 09/04/2018  . Myalgia 02/01/2018  . Pharyngitis 02/01/2018  . Cough 02/01/2018  . Elevated LFTs 09/14/2017  . 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 Amenorrhea 04/11/2017  . Encounter for examination following treatment at hospital 04/11/2017  . Depression 03/30/2017  . Class 3 severe obesity with serious comorbidity and body mass index (BMI) of 45.0 to 49.9 in adult ([facility]) 02/15/2017  . Healthcare maintenance 02/15/2017  . Migraine 07/14/2016  . Abdominal cramping 07/14/2016  . Anxiety 07/14/2016    Past Surgical History:  Procedure Laterality Date  . LEEP  2019   CIN 1-2, unclear margins    OB History    Gravida  0   Para  0   Term  0   Preterm  0   AB  0   Living  0     SAB  0   TAB  0   Ectopic  0   Multiple  0   Live Births    0            Home Medications    Prior to Admission medications   Medication Sig Start Date End Date Taking? Authorizing Provider  betamethasone valerate ointment (VALISONE) 0.1 % Apply 1 application topically 2 (two) times daily. 02/13/19   Romualdo Bolk, MD  HYDROcodone-acetaminophen (NORCO/VICODIN) 5-325 MG tablet TAKE 1 TABLET BY MOUTH EVERY 6 HOURS AS NEEDED FOR DENTAL PAIN 06/05/19   [provider]  ibuprofen (ADVIL) 800 MG tablet Take 800 mg by mouth every 6 (six) hours as needed. 06/05/19   [provider]  Insulin Pen Needle (BD PEN NEEDLE NANO 2ND GEN) 32G X 4 MM MISC 1 Package by Does not apply route 2 (two) times daily. Patient not taking: Reported on 07/18/2019 05/10/19   Corinna Capra A, DO  lidocaine (XYLOCAINE) 5 % ointment Apply a pea sized amount topically 20 minutes prior to intercourse, wipe off just prior to intercourse. 10/07/17   Romualdo Bolk, MD  norethindrone (ERRIN) 0.35 MG tablet Take 1 tablet (0.35 mg total) by mouth daily. 05/02/19   Romualdo Bolk, MD  trimethoprim-polymyxin b (POLYTRIM) ophthalmic solution Place 1 drop into both eyes 4 (four) times daily for 7 days. 10/17/19 10/24/19  Mickie Bailate, Nevea Spiewak H, NP    Family History Family History  Problem Relation Age of Onset  . Arthritis Mother   . Mental illness Mother   . Cancer Mother        skin   . Migraines Mother   . Depression Mother   . High blood pressure Mother   . Bipolar disorder Mother   . Obesity Mother   . Skin cancer Mother        not melanoma  . Alcohol abuse Father   . Hypertension Father   . Mental illness Father   . Depression Father   . Eating disorder Father   . Cancer Maternal Aunt        Bone  . Depression Maternal Aunt   . Lung cancer Maternal Aunt   . Arthritis Maternal Grandmother   . Stroke Maternal Grandmother   . Mental illness Maternal Grandmother   . Skin cancer Maternal Grandmother   . Cancer Maternal Grandmother        skin  .  Cancer Maternal Grandfather        lung  . Mental illness Maternal Grandfather   . Cancer Paternal Grandmother        Skin  . Depression Sister   . Depression Brother   . Cancer Maternal Uncle        skin  . Depression Maternal Uncle     Social History Social History   Tobacco Use  . Smoking status: Never Smoker  . Smokeless tobacco: Never Used  Vaping Use  . Vaping Use: Never used  Substance Use Topics  . Alcohol use: Yes    Comment: occassional  . Drug use: No     Allergies   Culturelle [lactobacillus] and Penicillins   Review of Systems Review of Systems  Constitutional: Negative for chills and fever.  HENT: Positive for congestion. Negative for ear pain and sore throat.   Eyes: Positive for discharge, redness and itching. Negative for pain and visual disturbance.  Respiratory: Positive for cough. Negative for shortness of breath.   Cardiovascular: Negative for chest pain and palpitations.  Gastrointestinal: Negative for abdominal pain, diarrhea, nausea and vomiting.  Genitourinary: Negative for dysuria and hematuria.  Musculoskeletal: Negative for arthralgias and back pain.  Skin: Negative for color change and rash.  Neurological: Negative for seizures and syncope.  All other systems reviewed and are negative.    Physical Exam Triage Vital Signs ED Triage Vitals  Enc Vitals Group     BP 10/17/19 0822 98/76     Pulse Rate 10/17/19 0822 89     Resp 10/17/19 0822 14     Temp 10/17/19 0822 98.5 F (36.9 C)     Temp src --      SpO2 10/17/19 0822 97 %     Weight --      Height --      Head Circumference --      Peak Flow --      Pain Score 10/17/19 0818 3     Pain Loc --      Pain Edu? --      Excl. in GC? --    No data found.  Updated Vital Signs BP 98/76   Pulse 89   Temp 98.5 F (36.9 C)   Resp 14   SpO2 97%   Visual Acuity Right Eye Distance:  20/20 Left Eye Distance: 20/20 Bilateral Distance: 20/20  Right Eye Near:   Left Eye Near:     Bilateral Near:     Physical Exam Vitals and nursing note reviewed.  Constitutional:      General: She is not in acute distress.    Appearance: She is well-developed. She is not ill-appearing.  HENT:     Head: Normocephalic and atraumatic.     Right Ear: Tympanic membrane normal.     Left Ear: Tympanic membrane normal.     Nose: Nose normal.     Mouth/Throat:     Mouth: Mucous membranes are moist.     Pharynx: Oropharynx is clear.  Eyes:     General: Vision grossly intact.     Extraocular Movements: Extraocular movements intact.     Conjunctiva/sclera:     Right eye: Right conjunctiva is injected.     Left eye: Left conjunctiva is injected.     Pupils: Pupils are equal, round, and reactive to light.  Cardiovascular:     Rate and Rhythm: Normal rate and regular rhythm.     Heart sounds: No murmur heard.   Pulmonary:     Effort: Pulmonary effort is normal. No respiratory distress.     Breath sounds: Normal breath sounds.  Abdominal:     Palpations: Abdomen is soft.     Tenderness: There is no abdominal tenderness. There is no guarding or rebound.  Musculoskeletal:     Cervical back: Neck supple.  Skin:    General: Skin is warm and dry.     Findings: No rash.  Neurological:     General: No focal deficit present.     Mental Status: She is alert and oriented to person, place, and time.     Gait: Gait normal.  Psychiatric:        Mood and Affect: Mood normal.        Behavior: Behavior normal.      UC Treatments / Results  Labs (all labs ordered are listed, but only abnormal results are displayed) Labs Reviewed - No data to display  EKG   Radiology No results found.  Procedures Procedures (including critical care time)  Medications Ordered in UC Medications - No data to display  Initial Impression / Assessment and Plan / UC Course  I have reviewed the triage vital signs and the nursing notes.  Pertinent labs & imaging results that were available during  my care of the patient were reviewed by me and considered in my medical decision making (see chart for details).   Acute bacterial conjunctivitis bilateral.  Treating with Polytrim eyedrops.  Instructed patient to go to the emergency department if she has acute eye pain or changes in her vision.  Instructed her to follow-up with her eye care provider in 1 to 2 days if her symptoms are not improving.  Patient agrees to plan of care.     Final Clinical Impressions(s) / UC Diagnoses   Final diagnoses:  Acute bacterial conjunctivitis of both eyes     Discharge Instructions     Use the antibiotic eyedrops as prescribed.    Follow-up with your eye doctor for a recheck in 1 to 2 days if your symptoms are not improving.    Go to the emergency department if you have acute eye pain or changes in your vision.       ED Prescriptions    Medication Sig Dispense Auth. Provider   trimethoprim-polymyxin b (POLYTRIM) ophthalmic solution  (  Status: Discontinued) Place 1 drop into both eyes 4 (four) times daily for 7 days. 10 mL Mickie Bail, NP   trimethoprim-polymyxin b (POLYTRIM) ophthalmic solution Place 1 drop into both eyes 4 (four) times daily for 7 days. 10 mL Mickie Bail, NP     PDMP not reviewed this encounter.   Mickie Bail, NP 10/17/19 [phone]

## 2019-11-30 ENCOUNTER — Other Ambulatory Visit: Payer: Self-pay | Admitting: Obstetrics and Gynecology

## 2019-12-04 NOTE — Telephone Encounter (Signed)
Please make sure she is set up for an annual exam. She has a h/o vaginal dysplasia. Due for a pap in mid November. Thanks

## 2019-12-04 NOTE — Telephone Encounter (Signed)
Medication refill request: errin  Last AEX:  02-20-2019 JJ  Next AEX: not currently scheduled  Last MMG (if hormonal medication request): none  Refill authorized: Today, please advise.   Medication pended for #84, 0RF. Please refill if appropriate.

## 2019-12-04 NOTE — Telephone Encounter (Signed)
Detailed message left on mobile number per DPR advising patient to call and schedule aex per Dr. Oscar La for mid November.

## 2019-12-14 ENCOUNTER — Other Ambulatory Visit: Payer: Self-pay

## 2019-12-14 NOTE — Progress Notes (Signed)
Pt presented today to complete Pre-employment UDS for [employer]. CL,RMA

## 2019-12-17 NOTE — Telephone Encounter (Signed)
Patient is scheduled for annual on 02-28-20 with [facility]. See Epic for appointment.   Encounter closed.

## 2020-01-19 ENCOUNTER — Encounter: Payer: Self-pay | Admitting: Obstetrics and Gynecology

## 2020-02-17 ENCOUNTER — Other Ambulatory Visit: Payer: Self-pay | Admitting: Obstetrics and Gynecology

## 2020-02-18 ENCOUNTER — Ambulatory Visit: Payer: 59 | Admitting: Obstetrics and Gynecology

## 2020-02-27 DIAGNOSIS — R87612 Low grade squamous intraepithelial lesion on cytologic smear of cervix (LGSIL): Secondary | ICD-10-CM | POA: Insufficient documentation

## 2020-02-27 DIAGNOSIS — N870 Mild cervical dysplasia: Secondary | ICD-10-CM

## 2020-02-27 DIAGNOSIS — N879 Dysplasia of cervix uteri, unspecified: Secondary | ICD-10-CM | POA: Insufficient documentation

## 2020-02-27 HISTORY — DX: Mild cervical dysplasia: N87.0

## 2020-02-27 NOTE — Progress Notes (Signed)
PCP:  Patient, No Pcp Per   Chief Complaint  Patient presents with  . Gynecologic Exam     HPI:      Lisa Logan is a 29 y.o. G0P0000 whose LMP was No LMP recorded. (Menstrual status: Oral contraceptives)., presents today for her annual examination.  Her menses were absent with POPs but has been having QOwk spotting/bleeding for the past 4 months. No missed pills, but sometimes a couple hrs late with them. Has dysmen, worse if late with pills. Has been under increased stress. Has had neg UPTs and recent neg STD testing.  Hx of menometrorrhagia in past and did OCPs with sx relief. Pt would like to restart them ideally or do some type of BC to stop periods due to pain/heavy flow in past.  Changed to POPs in past due to hx of complex migraines in high school with episode of babbling, facial numbness, and paralysis on 1 side of her body. Was hospitalized and had neg neuro workup. 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  Hasn't had complex migraines with POPs.  Sex activity: single partner, contraception - oral progesterone-only contraceptive.  Last Pap: 07/18/19  Results were: low-grade squamous intraepithelial neoplasia (LGSIL - encompassing HPV,mild dysplasia,CIN I) /POS HPV DNA. 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 LEEP path with CIN1-2 with an unclear margin. 6 month repeat pas was LGSIL/pos HPV DNA 02/20/19. 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 Repeat pap due in a 1 yr but pt wanted it sooner, so done 4/21. Due again today.  Hx of STDs: HPV  There is no FH of breast cancer. There is no FH of ovarian cancer. The patient does not do self-breast exams.  Tobacco use: The patient denies current or previous tobacco use. Alcohol use: social drinker No drug use.  Exercise: min active  She does get adequate calcium but not Vitamin D in her diet.   Gardasil completed  Past Medical History:  Diagnosis Date  . Abnormal Pap smear of cervix   .  Anxiety   . Arm fracture   . C. difficile colitis   . Depression   . Epilepsy (HCC)   . High grade squamous intraepithelial lesion (HGSIL), grade 3 CIN, on biopsy of cervix   . HPV in female   . Lactose intolerance   . Migraine   . Obesity   . SOB (shortness of breath)   . Vaccine for human papilloma virus (HPV) types 6, 11, 16, and 18 administered   . Vitamin D deficiency     Past Surgical History:  Procedure Laterality Date  . LEEP  2019   CIN 1-2, unclear margins    Family History  Problem Relation Age of Onset  . Arthritis Mother   . Mental illness Mother   . Cancer Mother        skin   . Migraines Mother   . Depression Mother   . High blood pressure Mother   . Bipolar disorder Mother   . Obesity Mother   . Skin cancer Mother        not melanoma  . Alcohol abuse Father   . Hypertension Father   . Mental illness Father   . Depression Father   . Eating disorder Father   . Cancer Maternal Aunt        Bone  . Depression Maternal Aunt   . Lung cancer Maternal Aunt   .  Arthritis Maternal Grandmother   . Stroke Maternal Grandmother   . Mental illness Maternal Grandmother   . Skin cancer Maternal Grandmother   . Cancer Maternal Grandmother        skin  . Cancer Maternal Grandfather        lung  . Mental illness Maternal Grandfather   . Cancer Paternal Grandmother        Skin  . Depression Sister   . Depression Brother   . Cancer Maternal Uncle        skin  . Depression Maternal Uncle     Social History   Socioeconomic History  . Marital status: Married    Spouse name: Maisie Fushomas  . Number of children: 0  . Years of education: 2012  . Highest education level: Not on file  Occupational History  . 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 Smoking status: Never Smoker  . Smokeless tobacco: Never Used  Vaping Use  . Vaping Use: Never used  Substance and Sexual Activity  . Alcohol use: Yes    Comment: occassional  . Drug use: No  . Sexual activity:  Yes    Birth control/protection: Pill  Other Topics Concern  . Not on file  Social History Narrative   Married   Works at [employer], [employer]   Right-handed   Caffeine: tea, soda   Social Determinants of Health   Financial Resource Strain:   . Difficulty of Paying Living Expenses: Not on file  Food Insecurity:   . Worried About Programme researcher, broadcasting/film/videounning Out of Food in the Last Year: Not on file  . Ran Out of Food in the Last Year: Not on file  Transportation Needs:   . Lack of Transportation (Medical): Not on file  . Lack of Transportation (Non-Medical): Not on file  Physical Activity:   . Days of Exercise per Week: Not on file  . Minutes of Exercise per Session: Not on file  Stress:   . Feeling of Stress : Not on file  Social Connections:   . Frequency of Communication with Friends and Family: Not on file  . Frequency of Social Gatherings with Friends and Family: Not on file  . Attends Religious Services: Not on file  . Active Member of Clubs or Organizations: Not on file  . Attends BankerClub or Organization Meetings: Not on file  . Marital Status: Not on file  Intimate Partner Violence:   . Fear of Current or Ex-Partner: Not on file  . Emotionally Abused: Not on file  . Physically Abused: Not on file  . Sexually Abused: Not on file     Current Outpatient Medications:  .  betamethasone valerate ointment (VALISONE) 0.1 %, Apply 1 application topically 2 (two) times daily., Disp: 30 g, Rfl: 0 .  ERRIN 0.35 MG tablet, TAKE 1 TABLET BY MOUTH  DAILY, Disp: 84 tablet, Rfl: 0 .  HYDROcodone-acetaminophen (NORCO/VICODIN) 5-325 MG tablet, TAKE 1 TABLET BY MOUTH EVERY 6 HOURS AS NEEDED FOR DENTAL PAIN, Disp: , Rfl:  .  ibuprofen (ADVIL) 800 MG tablet, Take 800 mg by mouth every 6 (six) hours as needed., Disp: , Rfl:  .  lidocaine (XYLOCAINE) 5 % ointment, Apply a pea sized amount topically 20 minutes prior to intercourse, wipe off just prior to intercourse., Disp: 30 g, Rfl: 0 .  Insulin Pen Needle (BD PEN  NEEDLE NANO 2ND GEN) 32G X 4 MM MISC, 1 Package by Does not apply route 2 (two) times daily. (Patient not  taking: Reported on 07/18/2019), Disp: 100 each, Rfl: 0 .  medroxyPROGESTERone Acetate 150 MG/ML SUSY, Inject 1 mL (150 mg total) into the muscle once for 1 dose., Disp: 1 mL, Rfl: 3     ROS:  Review of Systems  Constitutional: Negative for fatigue, fever and unexpected weight change.  Respiratory: Negative for cough, shortness of breath and wheezing.   Cardiovascular: Negative for chest pain, palpitations and leg swelling.  Gastrointestinal: Negative for blood in stool, constipation, diarrhea, nausea and vomiting.  Endocrine: Negative for cold intolerance, heat intolerance and polyuria.  Genitourinary: Positive for vaginal bleeding. Negative for dyspareunia, dysuria, flank pain, frequency, genital sores, hematuria, menstrual problem, pelvic pain, urgency, vaginal discharge and vaginal pain.  Musculoskeletal: Negative for back pain, joint swelling and myalgias.  Skin: Negative for rash.  Neurological: Positive for headaches. Negative for dizziness, syncope, light-headedness and numbness.  Hematological: Negative for adenopathy.  Psychiatric/Behavioral: Positive for agitation and dysphoric mood. Negative for confusion, sleep disturbance and suicidal ideas. The patient is not nervous/anxious.   BREAST: No symptoms   Objective: BP 118/82   Pulse 89   Temp 98.1 F (36.7 C) (Oral)   Resp 16   Ht 5\' 3"  (1.6 m)   Wt 257 lb 6.4 oz (116.8 kg)   BMI 45.60 kg/m    Physical Exam Constitutional:      Appearance: She is well-developed.  Genitourinary:     Vulva, vagina, cervix, uterus, right adnexa and left adnexa normal.     No vulval lesion or tenderness noted.     No vaginal discharge, erythema or tenderness.     No cervical polyp.     Uterus is not enlarged or tender.     No right or left adnexal mass present.     Right adnexa not tender.     Left adnexa not tender.  Neck:      Thyroid: No thyromegaly.  Cardiovascular:     Rate and Rhythm: Normal rate and regular rhythm.     Heart sounds: Normal heart sounds. No murmur heard.   Pulmonary:     Effort: Pulmonary effort is normal.     Breath sounds: Normal breath sounds.  Chest:     Breasts:        Right: No mass, nipple discharge, skin change or tenderness.        Left: No mass, nipple discharge, skin change or tenderness.  Abdominal:     Palpations: Abdomen is soft.     Tenderness: There is no abdominal tenderness. There is no guarding.  Musculoskeletal:        General: Normal range of motion.     Cervical back: Normal range of motion.  Neurological:     General: No focal deficit present.     Mental Status: She is alert and oriented to person, place, and time.     Cranial Nerves: No cranial nerve deficit.  Skin:    General: Skin is warm and dry.  Psychiatric:        Mood and Affect: Mood normal.        Behavior: Behavior normal.        Thought Content: Thought content normal.        Judgment: Judgment normal.  Vitals reviewed.     Assessment/Plan: Encounter for annual routine gynecological examination  Cervical cancer screening - Plan: Cytology - PAP  Screening for HPV (human papillomavirus) - Plan: Cytology - PAP  LGSIL on Pap smear of cervix - Plan: Cytology -  PAP; repeat pap today. Will call with results and mgmt.   Cervical dysplasia - Plan: Cytology - PAP  Encounter for initial prescription of injectable contraceptive - Plan: medroxyPROGESTERone Acetate 150 MG/ML SUSY; Prog only BC options discussed due to BTB on camila. Offered slynd but pt doesn't want placebo pills. Also discussed depo, nexplanon, and IUD. Can't guarantee amenorrhea or dysmen relief with IUD. Pt elects to try depo. Will RTO for injection anytime since no placebo pills on current POP.  Pt prefers to restart estrogen OCPs but I don't feel comfortable with them given hx of complex migraines with paralysis/speech  disturbance. If neuro oks OCPs, then can change to them.   Meds ordered this encounter  Medications  . medroxyPROGESTERone Acetate 150 MG/ML SUSY    Sig: Inject 1 mL (150 mg total) into the muscle once for 1 dose.    Dispense:  1 mL    Refill:  3    Order Specific Question:   Supervising Provider    Answer:   Nadara Mustard [[id number]]             GYN counsel family planning choices, adequate intake of calcium and vitamin D, diet and exercise     F/U  Return in about 1 year (around 02/27/2021).  Treesa Mccully B. Cynai Skeens, PA-C 02/28/2020 10:51 AM

## 2020-02-28 ENCOUNTER — Other Ambulatory Visit (HOSPITAL_COMMUNITY)
Admission: RE | Admit: 2020-02-28 | Discharge: 2020-02-28 | Disposition: A | Discharge status: Home / Self Care | Payer: 59 | Source: Ambulatory Visit | Attending: Obstetrics and Gynecology | Admitting: Obstetrics and Gynecology

## 2020-02-28 ENCOUNTER — Encounter: Payer: Self-pay | Admitting: Obstetrics and Gynecology

## 2020-02-28 ENCOUNTER — Ambulatory Visit (INDEPENDENT_AMBULATORY_CARE_PROVIDER_SITE_OTHER): Payer: 59 | Admitting: Obstetrics and Gynecology

## 2020-02-28 ENCOUNTER — Other Ambulatory Visit: Payer: Self-pay

## 2020-02-28 VITALS — BP 118/82 | HR 89 | Temp 98.1°F | Resp 16 | Ht 63.0 in | Wt 257.4 lb

## 2020-02-28 DIAGNOSIS — N879 Dysplasia of cervix uteri, unspecified: Secondary | ICD-10-CM | POA: Insufficient documentation

## 2020-02-28 DIAGNOSIS — Z1151 Encounter for screening for human papillomavirus (HPV): Secondary | ICD-10-CM | POA: Diagnosis not present

## 2020-02-28 DIAGNOSIS — Z124 Encounter for screening for malignant neoplasm of cervix: Secondary | ICD-10-CM

## 2020-02-28 DIAGNOSIS — Z01419 Encounter for gynecological examination (general) (routine) without abnormal findings: Secondary | ICD-10-CM | POA: Diagnosis not present

## 2020-02-28 DIAGNOSIS — R87612 Low grade squamous intraepithelial lesion on cytologic smear of cervix (LGSIL): Secondary | ICD-10-CM | POA: Diagnosis present

## 2020-02-28 DIAGNOSIS — Z3041 Encounter for surveillance of contraceptive pills: Secondary | ICD-10-CM

## 2020-02-28 DIAGNOSIS — Z30013 Encounter for initial prescription of injectable contraceptive: Secondary | ICD-10-CM

## 2020-02-28 MED ORDER — MEDROXYPROGESTERONE ACETATE 150 MG/ML IM SUSY: 150.0000 mg | PREFILLED_SYRINGE | Freq: Once | INTRAMUSCULAR | 3 refills | Status: DC

## 2020-02-28 NOTE — Patient Instructions (Signed)
I value your feedback and entrusting us with your care. If you get a [facility] patient survey, I would appreciate you taking the time to let us know about your experience today. Thank you!  As of March 08, 2019, your lab results will be released to your MyChart immediately, before I even have a chance to see them. Please give me time to review them and contact you if there are any abnormalities. Thank you for your patience.  

## 2020-03-03 IMAGING — US US ABDOMEN LIMITED
1 series · 14 of 25 positions shown · non-contrast
Comparison: None.

CLINICAL DATA: Elevated liver function tests.

EXAM:
ULTRASOUND ABDOMEN LIMITED RIGHT UPPER QUADRANT

[Series 1: us abdomen limited · 0.28mm/px · 14 of 32 slices shown]
[im 1/32]
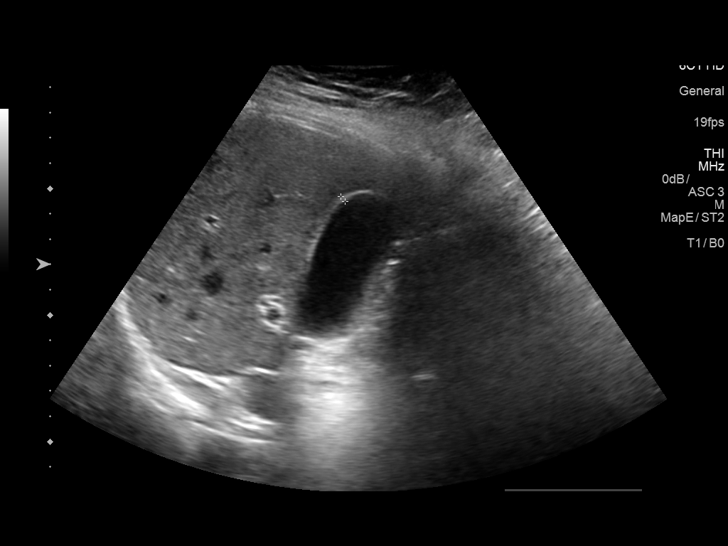
[im 3/32]
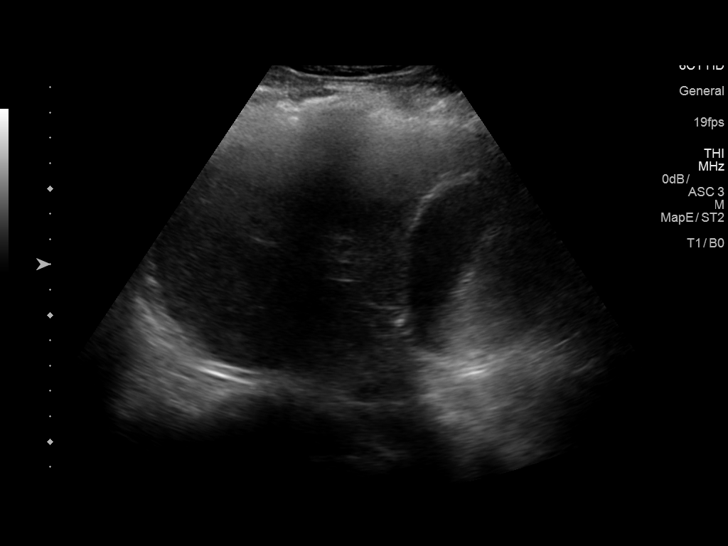
[im 6/32]
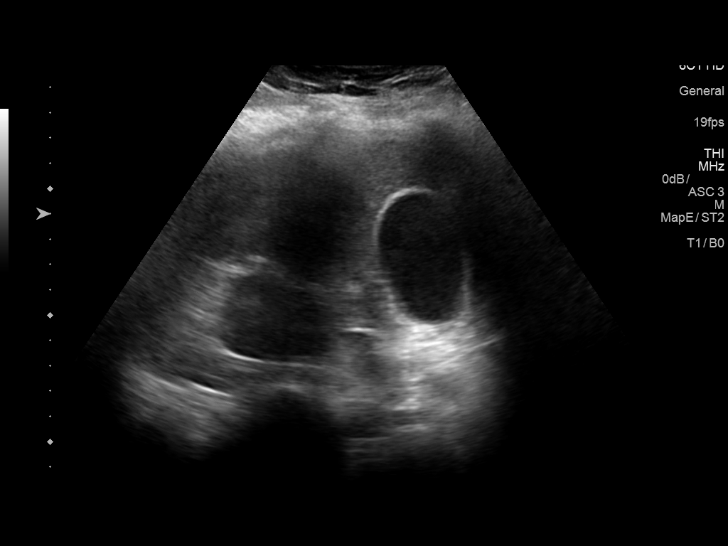
[im 8/32]
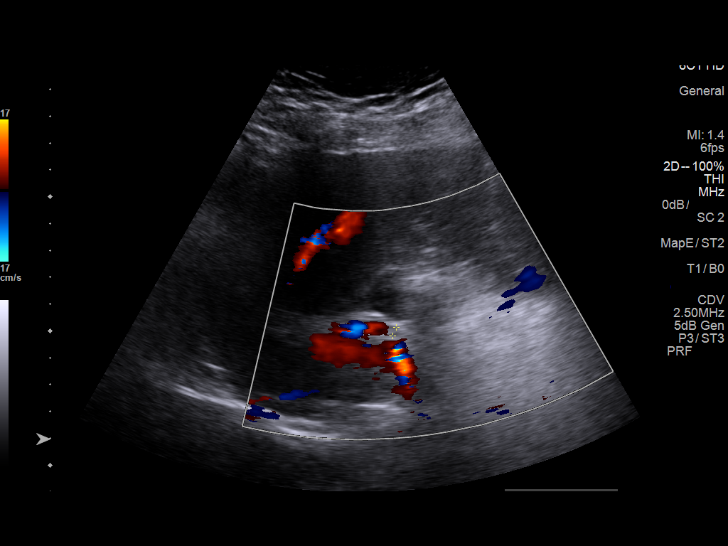
[im 11/32]
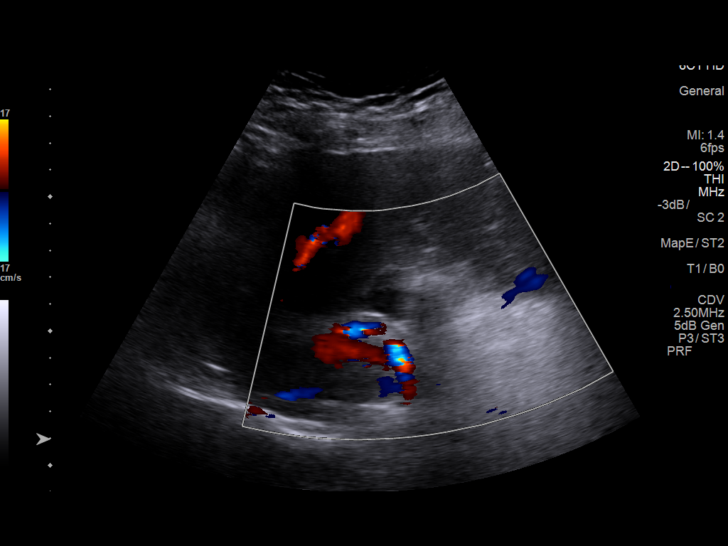
[im 12/32]
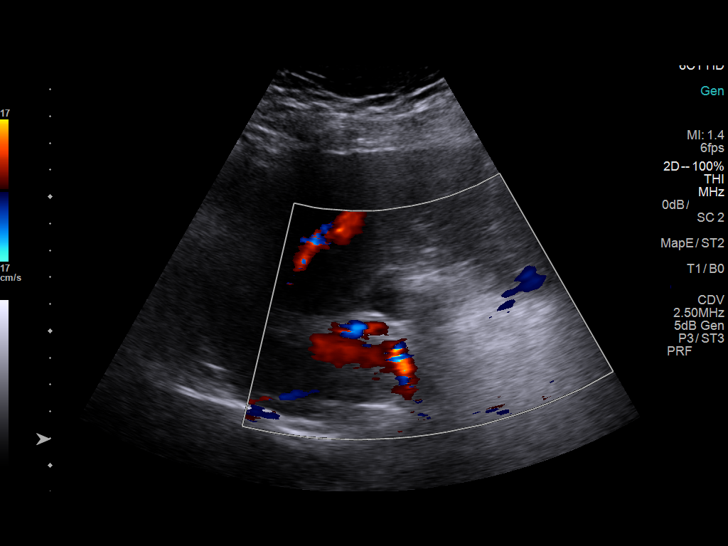
[im 15/32]
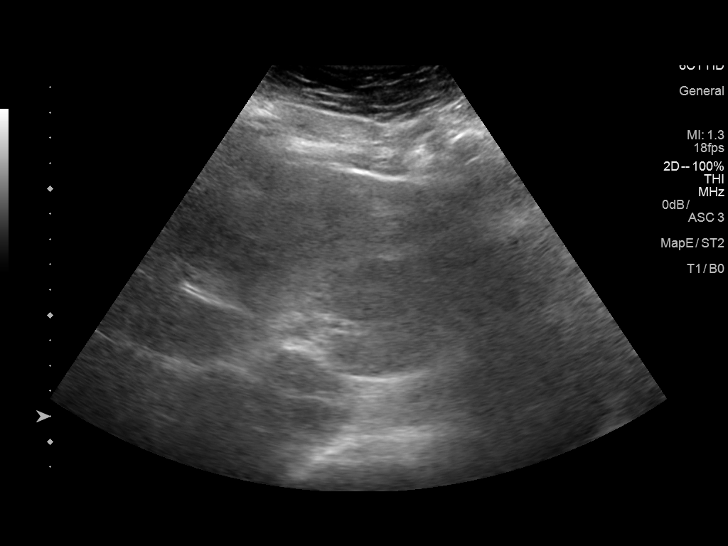
[im 17/32]
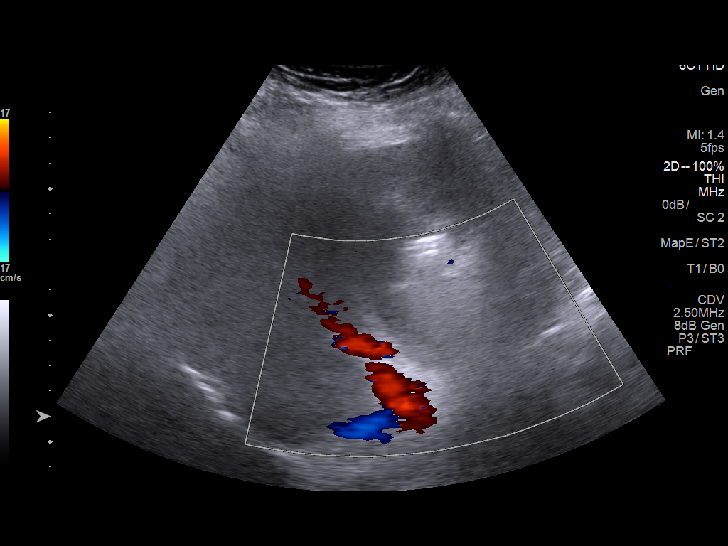
[im 20/32]
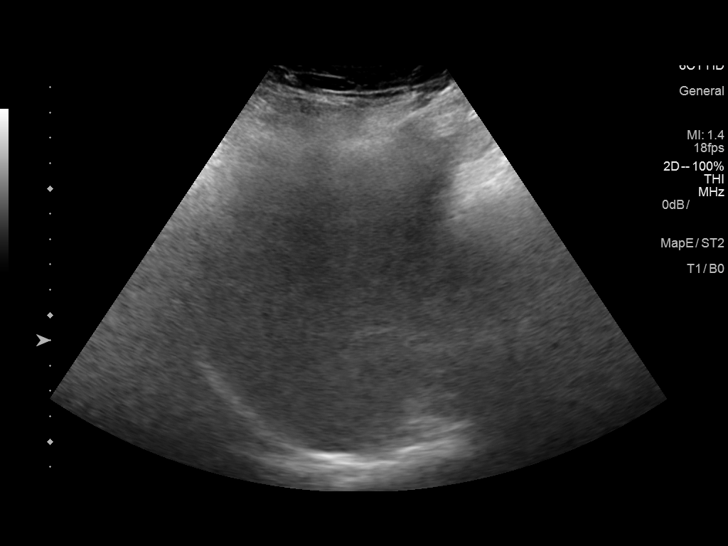
[im 21/32]
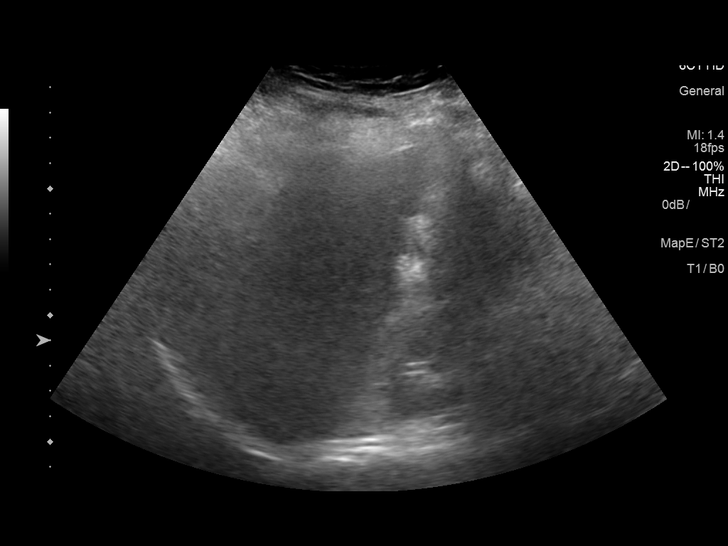
[im 24/32]
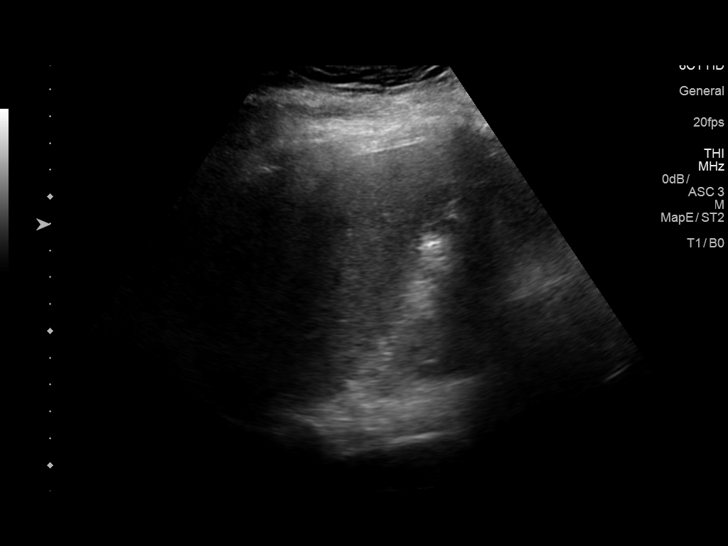
[im 26/32]
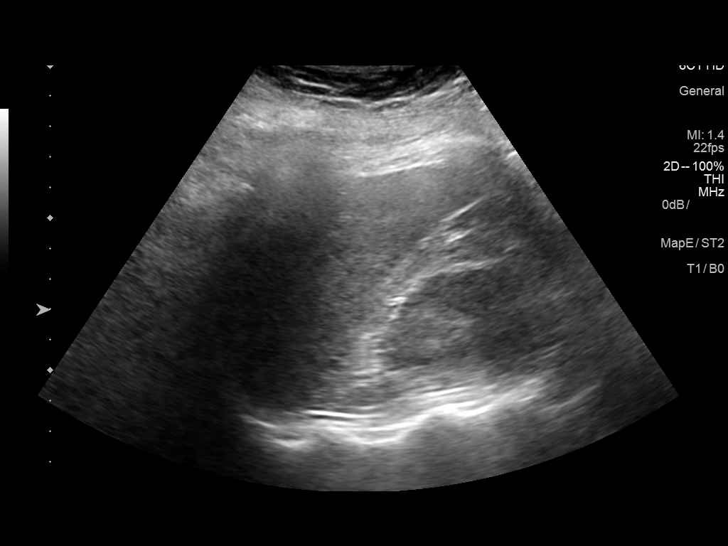
[im 29/32]
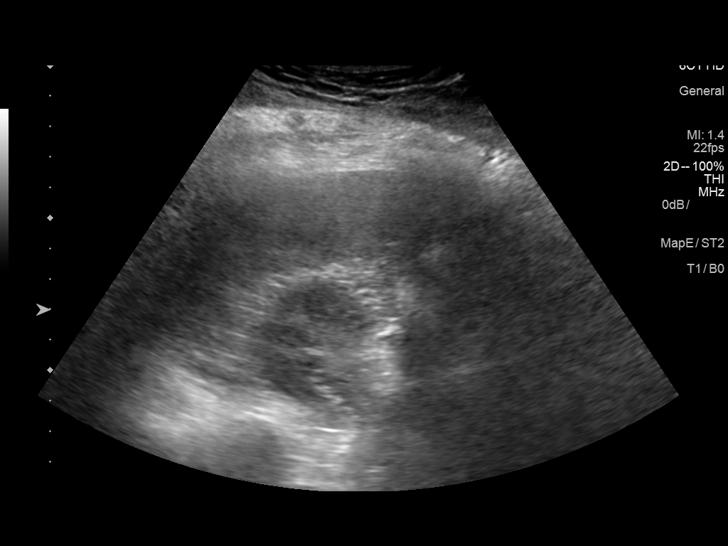
[im 32/32]
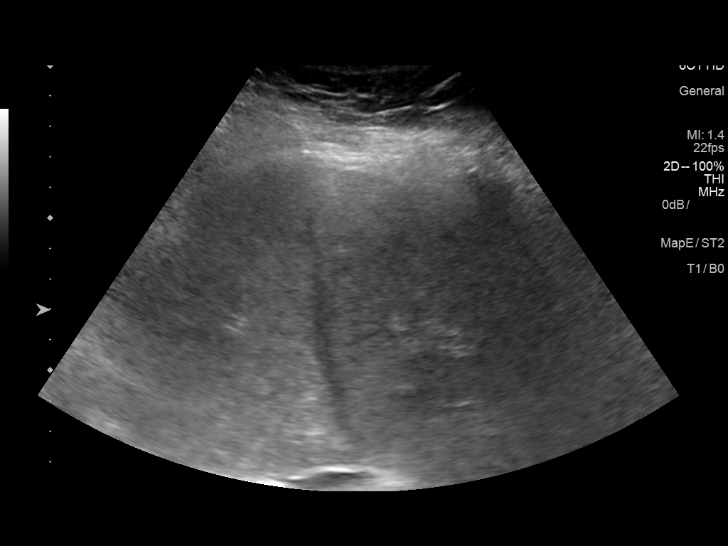

[14 of 25 positions shown; findings below may reference images not displayed]

FINDINGS: Technically suboptimal exam due to large patient habitus.

Gallbladder:

No gallstones or wall thickening visualized. No sonographic Murphy
sign noted by sonographer.

Common bile duct:

Diameter: 4 mm, within normal limits.

Liver:

No focal lesion identified. Within normal limits in parenchymal
echogenicity. Portal vein is patent on color Doppler imaging with
normal direction of blood flow towards the liver.
IMPRESSION: No hepatobiliary abnormality identified.

## 2020-03-04 ENCOUNTER — Other Ambulatory Visit: Payer: Self-pay | Admitting: Obstetrics and Gynecology

## 2020-03-04 LAB — CYTOLOGY - PAP
Adequacy: ABSENT
Comment: NEGATIVE
High risk HPV: POSITIVE — AB

## 2020-03-25 ENCOUNTER — Ambulatory Visit: Payer: Self-pay

## 2020-03-25 DIAGNOSIS — Z1152 Encounter for screening for COVID-19: Secondary | ICD-10-CM

## 2020-03-25 LAB — POCT INFLUENZA A/B
Influenza A, POC: NEGATIVE
Influenza B, POC: NEGATIVE

## 2020-03-27 ENCOUNTER — Other Ambulatory Visit: Payer: 59

## 2020-03-27 LAB — SARS-COV-2, NAA 2 DAY TAT

## 2020-03-27 LAB — NOVEL CORONAVIRUS, NAA: SARS-CoV-2, NAA: NOT DETECTED

## 2020-04-04 ENCOUNTER — Other Ambulatory Visit (HOSPITAL_COMMUNITY)
Admission: RE | Admit: 2020-04-04 | Discharge: 2020-04-04 | Disposition: A | Discharge status: Home / Self Care | Payer: 59 | Source: Ambulatory Visit | Attending: Obstetrics and Gynecology | Admitting: Obstetrics and Gynecology

## 2020-04-04 ENCOUNTER — Encounter: Payer: Self-pay | Admitting: Obstetrics and Gynecology

## 2020-04-04 ENCOUNTER — Other Ambulatory Visit: Payer: Self-pay

## 2020-04-04 ENCOUNTER — Ambulatory Visit (INDEPENDENT_AMBULATORY_CARE_PROVIDER_SITE_OTHER): Payer: 59 | Admitting: Obstetrics and Gynecology

## 2020-04-04 VITALS — BP 112/78 | HR 92 | Ht 63.0 in | Wt 278.0 lb

## 2020-04-04 DIAGNOSIS — N926 Irregular menstruation, unspecified: Secondary | ICD-10-CM | POA: Diagnosis not present

## 2020-04-04 DIAGNOSIS — R87612 Low grade squamous intraepithelial lesion on cytologic smear of cervix (LGSIL): Secondary | ICD-10-CM

## 2020-04-04 LAB — POCT URINE PREGNANCY: Preg Test, Ur: NEGATIVE

## 2020-04-04 NOTE — Addendum Note (Signed)
Addended by: Mariel Sleet on: 04/04/2020 04:36 PM   Modules accepted: Orders

## 2020-04-04 NOTE — Progress Notes (Signed)
HPI:  Lisa Logan is a 30 y.o.  G0P0000  who presents today for evaluation and management of abnormal cervical cytology.    Dysplasia History:   LGSIL, HPV+ see other results  OB History  Gravida Para Term Preterm AB Living  0 0 0 0 0 0  SAB IAB Ectopic Multiple Live Births  0 0 0 0 0    Past Medical History:  Diagnosis Date  . Abnormal Pap smear of cervix   . Anxiety   . Arm fracture   . C. difficile colitis   . Depression   . Epilepsy (HCC)   . High grade squamous intraepithelial lesion (HGSIL), grade 3 CIN, on biopsy of cervix   . HPV in female   . Lactose intolerance   . Migraine   . Obesity   . SOB (shortness of breath)   . Vaccine for human papilloma virus (HPV) types 6, 11, 16, and 18 administered   . Vitamin D deficiency     Past Surgical History:  Procedure Laterality Date  . LEEP  2019   CIN 1-2, unclear margins    SOCIAL HISTORY:  Social History   Substance and Sexual Activity  Alcohol Use Yes   Comment: occassional    Social History   Substance and Sexual Activity  Drug Use No     Family History  Problem Relation Age of Onset  . Arthritis Mother   . Mental illness Mother   . Cancer Mother        skin   . Migraines Mother   . Depression Mother   . High blood pressure Mother   . Bipolar disorder Mother   . Obesity Mother   . Skin cancer Mother        not melanoma  . Alcohol abuse Father   . Hypertension Father   . Mental illness Father   . Depression Father   . Eating disorder Father   . Cancer Maternal Aunt        Bone  . Depression Maternal Aunt   . Lung cancer Maternal Aunt   . Arthritis Maternal Grandmother   . Stroke Maternal Grandmother   . Mental illness Maternal Grandmother   . Skin cancer Maternal Grandmother   . Cancer Maternal Grandmother        skin  . Cancer Maternal Grandfather        lung  . Mental illness Maternal Grandfather   . Cancer Paternal Grandmother        Skin  . Depression Sister   .  Depression Brother   . Cancer Maternal Uncle        skin  . Depression Maternal Uncle     ALLERGIES:  Culturelle [lactobacillus] and Penicillins  Current Outpatient Medications on File Prior to Visit  Medication Sig Dispense Refill  . betamethasone valerate ointment (VALISONE) 0.1 % Apply 1 application topically 2 (two) times daily. 30 g 0  . ERRIN 0.35 MG tablet TAKE 1 TABLET BY MOUTH  DAILY 84 tablet 0  . HYDROcodone-acetaminophen (NORCO/VICODIN) 5-325 MG tablet TAKE 1 TABLET BY MOUTH EVERY 6 HOURS AS NEEDED FOR DENTAL PAIN    . ibuprofen (ADVIL) 800 MG tablet Take 800 mg by mouth every 6 (six) hours as needed.    . Insulin Pen Needle (BD PEN NEEDLE NANO 2ND GEN) 32G X 4 MM MISC 1 Package by Does not apply route 2 (two) times daily. 100 each 0  . lidocaine (XYLOCAINE) 5 % ointment  Apply a pea sized amount topically 20 minutes prior to intercourse, wipe off just prior to intercourse. 30 g 0  . medroxyPROGESTERone Acetate 150 MG/ML SUSY Inject 1 mL (150 mg total) into the muscle once for 1 dose. 1 mL 3   No current facility-administered medications on file prior to visit.    Physical Exam: -Vitals:  BP 112/78   Pulse 92   Ht 5\' 3"  (1.6 m)   Wt 278 lb (126.1 kg)   SpO2 98%   BMI 49.25 kg/m  GEN: WD, WN, NAD.  A+ O x 3, good mood and affect. ABD:  NT, ND.  Soft, no masses.  No hernias noted.   Pelvic:   Vulva: Normal appearance.  No lesions.  Vagina: No lesions or abnormalities noted.  Support: Normal pelvic support.  Urethra No masses tenderness or scarring.  Meatus Normal size without lesions or prolapse.  Cervix: See below.  Anus: Normal exam.  No lesions.  Perineum: Normal exam.  No lesions.        Bimanual   Uterus: Normal size.  Non-tender.  Mobile.  AV.  Adnexae: No masses.  Non-tender to palpation.  Cul-de-sac: Negative for abnormality.   PROCEDURE: 1.  Urine Pregnancy Test:  negative 2.  Colposcopy performed with 4% acetic acid after verbal consent obtained                                          -Aceto-white Lesions Location(s): mildly diffusely              -Biopsy performed at 6 and 12 o'clock               -ECC indicated and performed: Yes.       -Biopsy sites made hemostatic with pressure and Monsel's solution   -Satisfactory colposcopy: No.    -Evidence of Invasive cervical CA :  NO  ASSESSMENT:  Lisa Logan is a 30 y.o. G0P0000 here for  1. LGSIL on Pap smear of cervix   .  PLAN: I discussed the grading system of pap smears and HPV high risk viral types.  We will discuss and base management after colpo results return.     27, MD  [facility], [facility] 04/04/2020  4:31 PM

## 2020-04-07 ENCOUNTER — Other Ambulatory Visit: Payer: Self-pay | Admitting: Obstetrics and Gynecology

## 2020-04-07 ENCOUNTER — Telehealth: Payer: Self-pay

## 2020-04-07 DIAGNOSIS — Z3041 Encounter for surveillance of contraceptive pills: Secondary | ICD-10-CM

## 2020-04-07 MED ORDER — NORETHINDRONE 0.35 MG PO TABS: 1.0000 | ORAL_TABLET | Freq: Every day | ORAL | 0 refills | Status: DC

## 2020-04-07 NOTE — Telephone Encounter (Signed)
Patient requesting 1 mo rf of ERRIN. She has rx for Depo, but wants to hold off starting it until all of her procedures are done. She wants to continue ERRIN until then and needs 1 mo sent to local [facility] on file. [phone]

## 2020-04-07 NOTE — Telephone Encounter (Signed)
Sent to [facility], 84 day supply

## 2020-04-08 LAB — SURGICAL PATHOLOGY

## 2020-04-09 ENCOUNTER — Telehealth: Payer: Self-pay

## 2020-04-09 ENCOUNTER — Other Ambulatory Visit: Payer: Self-pay | Admitting: Obstetrics and Gynecology

## 2020-04-09 ENCOUNTER — Other Ambulatory Visit: Payer: Self-pay

## 2020-04-09 DIAGNOSIS — Z3041 Encounter for surveillance of contraceptive pills: Secondary | ICD-10-CM

## 2020-04-09 MED ORDER — NORETHINDRONE 0.35 MG PO TABS: 1.0000 | ORAL_TABLET | Freq: Every day | ORAL | 0 refills | Status: DC

## 2020-04-09 NOTE — Telephone Encounter (Signed)
Patient is calling to follow up on missed call. Prescription or results. Please advise

## 2020-04-09 NOTE — Telephone Encounter (Signed)
refilled pt OCP for one month to [facility]

## 2020-04-09 NOTE — Telephone Encounter (Signed)
[facility] are [facility] on file. One in [place] and one in [place].  Can you find out which one and call this prescription in for her?  If she can wait until tomorrow, I can probably call it in then.

## 2020-04-09 NOTE — Telephone Encounter (Signed)
Tried to call pt, please let me know when she calls back or verify pharmacy for me so I can send in 1 month supply

## 2020-04-10 ENCOUNTER — Other Ambulatory Visit: Payer: Self-pay | Admitting: Obstetrics and Gynecology

## 2020-04-10 ENCOUNTER — Telehealth: Payer: Self-pay | Admitting: Obstetrics and Gynecology

## 2020-04-10 DIAGNOSIS — Z3041 Encounter for surveillance of contraceptive pills: Secondary | ICD-10-CM

## 2020-04-10 MED ORDER — NORETHINDRONE 0.35 MG PO TABS: 1.0000 | ORAL_TABLET | Freq: Every day | ORAL | 4 refills | Status: DC

## 2020-04-10 NOTE — Telephone Encounter (Signed)
Discussed CIN 1 on pathology (actually listed as LGSIL instead of CIN nomenclature).  Discussed recommended follow up of 1 year for pap smear WITH HPV.   All questions answered.

## 2020-04-10 NOTE — Telephone Encounter (Signed)
I also sent in Rx for 1 year just in case she needs more

## 2020-04-10 NOTE — Telephone Encounter (Signed)
No worries. I sent in 1 year supply to [facility] on [address].  She can use it however, she likes.

## 2020-04-11 ENCOUNTER — Ambulatory Visit: Payer: Self-pay

## 2020-04-11 ENCOUNTER — Other Ambulatory Visit: Payer: Self-pay

## 2020-04-11 VITALS — BP 107/71

## 2020-04-11 DIAGNOSIS — Z23 Encounter for immunization: Secondary | ICD-10-CM

## 2020-04-11 MED ORDER — RABIES VACCINE, PCEC IM SUSR
1.0000 mL | Freq: Once | INTRAMUSCULAR | Status: AC
  Administered 2020-04-11: 1 mL via INTRAMUSCULAR

## 2020-04-11 NOTE — Progress Notes (Signed)
Works for [employer] & presents to clinic for 1st dose of Rabies Pre-vaccine series.  Will return to clinic on 04/18/20 for 2nd dose of Rabavert.  Will return to clinic on 05/02/20 for 3rd dose of Rabavert.  AMD

## 2020-04-18 ENCOUNTER — Ambulatory Visit: Payer: Self-pay

## 2020-04-18 ENCOUNTER — Other Ambulatory Visit: Payer: Self-pay

## 2020-04-18 VITALS — BP 111/79 | HR 78

## 2020-04-18 DIAGNOSIS — Z23 Encounter for immunization: Secondary | ICD-10-CM

## 2020-04-18 MED ORDER — RABIES VACCINE, PCEC IM SUSR
1.0000 mL | Freq: Once | INTRAMUSCULAR | Status: AC
  Administered 2020-04-18: 1 mL via INTRAMUSCULAR

## 2020-05-02 ENCOUNTER — Other Ambulatory Visit: Payer: Self-pay

## 2020-05-02 ENCOUNTER — Ambulatory Visit: Payer: Self-pay

## 2020-05-02 VITALS — BP 112/74 | HR 82

## 2020-05-02 DIAGNOSIS — Z23 Encounter for immunization: Secondary | ICD-10-CM

## 2020-05-02 MED ORDER — RABIES VACCINE, PCEC IM SUSR
1.0000 mL | Freq: Once | INTRAMUSCULAR | Status: AC
  Administered 2020-05-02: 1 mL via INTRAMUSCULAR

## 2020-05-02 NOTE — Progress Notes (Signed)
Pt received the 3rd shot pre- rabies vaccine. Pt needs to schedule 6 week titer apt. CL,RMA

## 2020-05-06 ENCOUNTER — Encounter: Payer: Self-pay | Admitting: Obstetrics and Gynecology

## 2020-05-24 ENCOUNTER — Other Ambulatory Visit: Payer: Self-pay | Admitting: Obstetrics and Gynecology

## 2020-05-24 DIAGNOSIS — Z3041 Encounter for surveillance of contraceptive pills: Secondary | ICD-10-CM

## 2020-05-26 ENCOUNTER — Other Ambulatory Visit: Payer: Self-pay | Admitting: Obstetrics and Gynecology

## 2020-05-26 DIAGNOSIS — Z3041 Encounter for surveillance of contraceptive pills: Secondary | ICD-10-CM

## 2020-05-26 NOTE — Telephone Encounter (Signed)
ABC last annual.. colpo with SDJ

## 2020-06-13 ENCOUNTER — Other Ambulatory Visit: Payer: Self-pay

## 2020-06-13 DIAGNOSIS — Z01818 Encounter for other preprocedural examination: Secondary | ICD-10-CM

## 2020-06-13 DIAGNOSIS — Z0184 Encounter for antibody response examination: Secondary | ICD-10-CM

## 2020-06-13 LAB — POCT URINALYSIS DIPSTICK
Bilirubin, UA: NEGATIVE
Blood, UA: NEGATIVE
Glucose, UA: NEGATIVE
Ketones, UA: NEGATIVE
Leukocytes, UA: NEGATIVE
Nitrite, UA: NEGATIVE
Protein, UA: NEGATIVE
Spec Grav, UA: 1.015 (ref 1.010–1.025)
Urobilinogen, UA: 0.2 E.U./dL
pH, UA: 7 (ref 5.0–8.0)

## 2020-06-13 NOTE — Progress Notes (Signed)
Pt scheduled to complete physical 06/25/20 with ron smith,PA-C  CL,RMA

## 2020-06-14 DIAGNOSIS — Z23 Encounter for immunization: Secondary | ICD-10-CM | POA: Diagnosis not present

## 2020-06-17 LAB — CMP12+LP+TP+TSH+6AC+CBC/D/PLT
ALT: 10 IU/L (ref 0–32)
AST: 20 IU/L (ref 0–40)
Albumin/Globulin Ratio: 1.7 (ref 1.2–2.2)
Albumin: 4.6 g/dL (ref 3.9–5.0)
Alkaline Phosphatase: 82 IU/L (ref 44–121)
BUN/Creatinine Ratio: 15 (ref 9–23)
BUN: 13 mg/dL (ref 6–20)
Basophils Absolute: 0.1 10*3/uL (ref 0.0–0.2)
Basos: 1 %
Bilirubin Total: 0.3 mg/dL (ref 0.0–1.2)
Calcium: 9.8 mg/dL (ref 8.7–10.2)
Chloride: 101 mmol/L (ref 96–106)
Chol/HDL Ratio: 3.8 ratio (ref 0.0–4.4)
Cholesterol, Total: 184 mg/dL (ref 100–199)
Creatinine, Ser: 0.84 mg/dL (ref 0.57–1.00)
EOS (ABSOLUTE): 0.3 10*3/uL (ref 0.0–0.4)
Eos: 3 %
Estimated CHD Risk: 0.8 times avg. (ref 0.0–1.0)
Free Thyroxine Index: 2.9 (ref 1.2–4.9)
GGT: 11 IU/L (ref 0–60)
Globulin, Total: 2.7 g/dL (ref 1.5–4.5)
Glucose: 83 mg/dL (ref 65–99)
HDL: 48 mg/dL (ref >39)
Hematocrit: 41.8 % (ref 34.0–46.6)
Hemoglobin: 13.6 g/dL (ref 11.1–15.9)
Immature Grans (Abs): 0 10*3/uL (ref 0.0–0.1)
Immature Granulocytes: 0 %
Iron: 60 ug/dL (ref 27–159)
LDH: 191 IU/L (ref 119–226)
LDL Chol Calc (NIH): 116 mg/dL — ABNORMAL HIGH (ref 0–99)
Lymphocytes Absolute: 2.5 10*3/uL (ref 0.7–3.1)
Lymphs: 27 %
MCH: 28.3 pg (ref 26.6–33.0)
MCHC: 32.5 g/dL (ref 31.5–35.7)
MCV: 87 fL (ref 79–97)
Monocytes Absolute: 0.5 10*3/uL (ref 0.1–0.9)
Monocytes: 5 %
Neutrophils Absolute: 6.1 10*3/uL (ref 1.4–7.0)
Neutrophils: 64 %
Phosphorus: 4.3 mg/dL (ref 3.0–4.3)
Platelets: 241 10*3/uL (ref 150–450)
Potassium: 4.9 mmol/L (ref 3.5–5.2)
RBC: 4.81 x10E6/uL (ref 3.77–5.28)
RDW: 12.5 % (ref 11.7–15.4)
Sodium: 138 mmol/L (ref 134–144)
T3 Uptake Ratio: 29 % (ref 24–39)
T4, Total: 9.9 ug/dL (ref 4.5–12.0)
TSH: 1.63 u[IU]/mL (ref 0.450–4.500)
Total Protein: 7.3 g/dL (ref 6.0–8.5)
Triglycerides: 113 mg/dL (ref 0–149)
Uric Acid: 4.3 mg/dL (ref 2.6–6.2)
VLDL Cholesterol Cal: 20 mg/dL (ref 5–40)
WBC: 9.4 10*3/uL (ref 3.4–10.8)
eGFR: 97 mL/min/{1.73_m2} (ref >59)

## 2020-06-25 ENCOUNTER — Encounter: Payer: Self-pay | Admitting: Physician Assistant

## 2020-06-25 ENCOUNTER — Ambulatory Visit: Payer: Self-pay | Admitting: Physician Assistant

## 2020-06-25 ENCOUNTER — Other Ambulatory Visit: Payer: Self-pay

## 2020-06-25 VITALS — BP 116/86 | HR 87 | Temp 98.9°F | Resp 14 | Ht 63.0 in | Wt 260.0 lb

## 2020-06-25 DIAGNOSIS — Z0000 Encounter for general adult medical examination without abnormal findings: Secondary | ICD-10-CM

## 2020-06-25 NOTE — Progress Notes (Signed)
   Subjective: Annual exam    Patient ID: Lisa Logan, female    DOB: Apr 25, 1990, 30 y.o.   MRN: [id number]  HPI Patient presents for annual physical exam.  Patient reports no concerns or complaints.   Review of Systems    Negative Objective:   Physical Exam  No acute distress.  BP is 116/86, pulse 87, respiration 14, temperature 98.9, patient 98% O2 sat on room air.  BMI is 46.06.  HEENT is unremarkable.  Neck is supple adenectomy or bruits.  Lungs are clear to auscultation.  Heart regular rate and rhythm.  Abdomen with negative HSM, normal bowel sounds, soft, and nontender to palpation.  No obvious deformity to the upper or lower extremities.  Patient has full and equal range of motion of the upper and lower extremities.  No obvious cervical or lumbar spine deformity.  Patient has full and equal range of motion cervical lumbar spine.  Cranial nerves II through XII grossly intact.      Assessment & Plan: Well exam  Discussed no acute final lab results with patient.  Patient advised follow-up as necessary.

## 2020-07-16 LAB — RABIES NEUT.ABS TITRAT.(RFFIT)

## 2020-07-16 LAB — SPECIMEN STATUS REPORT

## 2020-09-12 ENCOUNTER — Ambulatory Visit: Payer: 59

## 2020-09-19 ENCOUNTER — Ambulatory Visit: Payer: 59

## 2020-10-13 ENCOUNTER — Encounter: Payer: Self-pay | Admitting: Physician Assistant

## 2020-10-13 ENCOUNTER — Ambulatory Visit: Payer: Self-pay | Admitting: Physician Assistant

## 2020-10-13 ENCOUNTER — Other Ambulatory Visit: Payer: Self-pay

## 2020-10-13 VITALS — Temp 97.8°F | Resp 14 | Ht 63.0 in | Wt 265.0 lb

## 2020-10-13 DIAGNOSIS — R0683 Snoring: Secondary | ICD-10-CM

## 2020-10-13 DIAGNOSIS — F4323 Adjustment disorder with mixed anxiety and depressed mood: Secondary | ICD-10-CM

## 2020-10-13 NOTE — Progress Notes (Signed)
   Subjective: Excessive snoring/sleep apnea/anxiety    Patient ID: Lisa Logan, female    DOB: 04/09/1990, 30 y.o.   MRN: [id number]  HPI Patient presents for excessive snoring and suspected sleep apnea.  Patient said her boyfriend have to use earplugs in order to to sleep together.  Patient also admits to daytime somnolence and anxiety.  Patient states she is currently going through a separation/divorce and believe her anxiety level has increased.  Patient states she has over 5-year history of anxiety and issues many different medications but none in the past year and a half.  Last medicine for anxiety was Paxil.  Stop medication without advice of Dr.   Review of Systems Amenorrhea, anxiety, depression, and insomnia.    Objective:   Physical Exam No acute distress.  Patient weighs 2 and 65 pounds with a BMI of 46.94. HEENT was unremarkable.  Neck is supple adenopathy no bruits.  Lungs clear to auscultation.  Heart regular rate and rhythm. Abdomen is distended secondary to body habitus, normoactive bowel sounds, soft, nontender to palpation.     Assessment & Plan: Excessive snoring and anxiety.   Patient will be scheduled for sleep study and will follow up status post procedure.  Will address anxiety issue after sleep study.

## 2020-11-20 ENCOUNTER — Other Ambulatory Visit: Payer: Self-pay

## 2020-11-20 ENCOUNTER — Encounter: Payer: Self-pay | Admitting: Physician Assistant

## 2020-11-20 ENCOUNTER — Ambulatory Visit: Payer: Self-pay | Admitting: Physician Assistant

## 2020-11-20 VITALS — Temp 97.3°F | Resp 14 | Ht 63.0 in | Wt 262.0 lb

## 2020-11-20 DIAGNOSIS — F411 Generalized anxiety disorder: Secondary | ICD-10-CM

## 2020-11-20 NOTE — Progress Notes (Signed)
Pt presents today to have consult for anxiety and depression. Pt hasn't been on medication for 2 years and for the past 2 months its drastically.  Pt believes she may have ADHD due to a questionnaire she took along with her sister.

## 2020-11-20 NOTE — Progress Notes (Signed)
   Subjective: Anxiety    Patient ID: Lisa Logan, female    DOB: 1990-04-16, 30 y.o.   MRN: [id number]  HPI Patient follow-up for concern of anxiety.  Patient states increased life and work stresses in the past couple months.  Patient also states her sister was recently diagnosed with ADD.  Patient state when she read the sheet explaining her sister condition it was similar to some of her conditions.  Requesting evaluation for ADD/ADHD before starting anxiety medication.  Patient also states she was unable to take the sleep study due to cost.  Patient states she is saving up to have the study performed.  Review of Systems Anxiety and sleep apnea    Objective:   Physical Exam No acute distress.  Physical exam deferred.     Assessment & Plan: Anxiety.   Discussed rationale for holding off medication for anxiety until ADD evaluation is complete.

## 2021-02-09 DIAGNOSIS — R69 Illness, unspecified: Secondary | ICD-10-CM | POA: Diagnosis not present

## 2021-03-14 DIAGNOSIS — R69 Illness, unspecified: Secondary | ICD-10-CM | POA: Diagnosis not present

## 2021-04-10 DIAGNOSIS — Z3041 Encounter for surveillance of contraceptive pills: Secondary | ICD-10-CM | POA: Diagnosis not present

## 2021-04-10 DIAGNOSIS — Z01419 Encounter for gynecological examination (general) (routine) without abnormal findings: Secondary | ICD-10-CM | POA: Diagnosis not present

## 2021-04-10 DIAGNOSIS — Z6841 Body Mass Index (BMI) 40.0 and over, adult: Secondary | ICD-10-CM | POA: Diagnosis not present

## 2021-04-23 ENCOUNTER — Encounter: Payer: Self-pay | Admitting: Physician Assistant

## 2021-04-23 ENCOUNTER — Ambulatory Visit: Payer: Self-pay | Admitting: Physician Assistant

## 2021-04-23 ENCOUNTER — Other Ambulatory Visit: Payer: Self-pay

## 2021-04-23 VITALS — BP 117/75 | HR 77 | Temp 97.6°F | Resp 14 | Ht 63.0 in | Wt 270.0 lb

## 2021-04-23 DIAGNOSIS — F4323 Adjustment disorder with mixed anxiety and depressed mood: Secondary | ICD-10-CM

## 2021-04-23 MED ORDER — FLUOXETINE HCL 20 MG PO CAPS: 20.0000 mg | ORAL_CAPSULE | Freq: Every day | ORAL | 1 refills | Status: DC

## 2021-04-23 NOTE — Progress Notes (Signed)
Pt needing rx refill on fluoxetine.

## 2021-04-23 NOTE — Progress Notes (Signed)
° °  Subjective: Medication refill for anxiety    Patient ID: Lisa Logan, female    DOB: 07-20-1990, 31 y.o.   MRN: [id number]  HPI Patient here for medication refill Prozac for anxiety.  Patient is taking 20 mg daily which she states controls her condition.   Review of Systems Anxiety, depression, and migraine headaches.    Objective:   Physical Exam No acute distress.  Temperature 97.6, pulse 77, respiration 2010, BP is 117/75, and patient is 98% O2 sat on room air.  Patient weighs 270 pounds and BMI is 47.83.     Assessment & Plan: Medication refill for anxiety.   Review of patient record shows this takes 20 mg.  Prescription sent to pharmacy 90 days with 1 refill.

## 2021-05-18 ENCOUNTER — Ambulatory Visit: Payer: 59 | Admitting: Physician Assistant

## 2021-05-18 ENCOUNTER — Ambulatory Visit: Payer: Self-pay | Admitting: Physician Assistant

## 2021-05-18 ENCOUNTER — Encounter: Payer: Self-pay | Admitting: Physician Assistant

## 2021-05-18 ENCOUNTER — Other Ambulatory Visit: Payer: Self-pay

## 2021-05-18 VITALS — BP 100/66 | HR 68 | Temp 97.7°F | Resp 14 | Ht 63.0 in | Wt 265.0 lb

## 2021-05-18 DIAGNOSIS — S61311A Laceration without foreign body of left index finger with damage to nail, initial encounter: Secondary | ICD-10-CM

## 2021-05-18 NOTE — Progress Notes (Signed)
° °  Subjective: Left index finger laceration    Patient ID: Lisa Logan, female    DOB: [birth year], 31 y.o.   MRN: [id number]  HPI Patient presents with laceration to left index finger.  Wound was caused by a knife while peeling onions.  Bleeding controlled with direct pressure.  Denies loss sensation or function.  Distal part of the medial nailbed is involved.  Patient is right-hand dominant tetanus shot is up-to-date.   Review of Systems Anxiety and depression    Objective:   Physical Exam No acute distress.  Temperature is 97.7, pulse 68, respiration 14, BP is 100/66, and patient is 99% O2 sat on room air. Examination of the left index finger shows 0.2 cm laceration with medial nail involvement.  Bleeding is controlled direct pressure.  Full and equal range of motion.  Sensation is intact.       Assessment & Plan: Left index finger laceration.  Obtain informed consent.  Area was clean and the skin was approximated using Dermabond.  Dermabond was also applied to the nail.  Patient given discharge care instructions and advised to return back in 3 days for reevaluation.

## 2021-05-18 NOTE — Progress Notes (Signed)
Pt cut finger tip last night at home with knife. Left index finger.Lisa Logan

## 2021-05-20 ENCOUNTER — Other Ambulatory Visit: Payer: Self-pay

## 2021-05-20 ENCOUNTER — Ambulatory Visit: Payer: Self-pay

## 2021-05-20 VITALS — BP 101/57 | HR 59

## 2021-05-20 DIAGNOSIS — Z0000 Encounter for general adult medical examination without abnormal findings: Secondary | ICD-10-CM

## 2021-05-20 LAB — POCT URINALYSIS DIPSTICK
Bilirubin, UA: NEGATIVE
Blood, UA: NEGATIVE
Glucose, UA: NEGATIVE
Ketones, UA: NEGATIVE
Leukocytes, UA: NEGATIVE
Nitrite, UA: NEGATIVE
Protein, UA: NEGATIVE
Spec Grav, UA: 1.015 (ref 1.010–1.025)
Urobilinogen, UA: 0.2 E.U./dL
pH, UA: 6.5 (ref 5.0–8.0)

## 2021-05-20 NOTE — Progress Notes (Signed)
Pt presents today for physical labs. Pt did have some syncope during lab draw. She did recover quickly and BP is back to normal.

## 2021-05-21 ENCOUNTER — Encounter: Payer: Self-pay | Admitting: Physician Assistant

## 2021-05-21 ENCOUNTER — Ambulatory Visit: Payer: 59 | Admitting: Physician Assistant

## 2021-05-21 ENCOUNTER — Ambulatory Visit: Payer: Self-pay | Admitting: Physician Assistant

## 2021-05-21 VITALS — BP 110/72 | HR 86 | Temp 97.1°F | Resp 12 | Ht 63.0 in | Wt 269.0 lb

## 2021-05-21 DIAGNOSIS — Z0000 Encounter for general adult medical examination without abnormal findings: Secondary | ICD-10-CM

## 2021-05-21 DIAGNOSIS — F4323 Adjustment disorder with mixed anxiety and depressed mood: Secondary | ICD-10-CM

## 2021-05-21 DIAGNOSIS — S61311A Laceration without foreign body of left index finger with damage to nail, initial encounter: Secondary | ICD-10-CM

## 2021-05-21 LAB — CMP12+LP+TP+TSH+6AC+CBC/D/PLT
ALT: 26 IU/L (ref 0–32)
AST: 22 IU/L (ref 0–40)
Albumin/Globulin Ratio: 1.3 (ref 1.2–2.2)
Albumin: 4 g/dL (ref 3.9–5.0)
Alkaline Phosphatase: 76 IU/L (ref 44–121)
BUN/Creatinine Ratio: 12 (ref 9–23)
BUN: 9 mg/dL (ref 6–20)
Basophils Absolute: 0.1 10*3/uL (ref 0.0–0.2)
Basos: 1 %
Bilirubin Total: 0.3 mg/dL (ref 0.0–1.2)
Calcium: 9.6 mg/dL (ref 8.7–10.2)
Chloride: 99 mmol/L (ref 96–106)
Chol/HDL Ratio: 3.8 ratio (ref 0.0–4.4)
Cholesterol, Total: 209 mg/dL — ABNORMAL HIGH (ref 100–199)
Creatinine, Ser: 0.73 mg/dL (ref 0.57–1.00)
EOS (ABSOLUTE): 0.1 10*3/uL (ref 0.0–0.4)
Eos: 1 %
Estimated CHD Risk: 0.7 times avg. (ref 0.0–1.0)
Free Thyroxine Index: 3 (ref 1.2–4.9)
GGT: 11 IU/L (ref 0–60)
Globulin, Total: 3 g/dL (ref 1.5–4.5)
Glucose: 89 mg/dL (ref 70–99)
HDL: 55 mg/dL (ref >39)
Hematocrit: 42.3 % (ref 34.0–46.6)
Hemoglobin: 14 g/dL (ref 11.1–15.9)
Immature Grans (Abs): 0 10*3/uL (ref 0.0–0.1)
Immature Granulocytes: 0 %
Iron: 98 ug/dL (ref 27–159)
LDH: 170 IU/L (ref 119–226)
LDL Chol Calc (NIH): 135 mg/dL — ABNORMAL HIGH (ref 0–99)
Lymphocytes Absolute: 2.2 10*3/uL (ref 0.7–3.1)
Lymphs: 28 %
MCH: 28.2 pg (ref 26.6–33.0)
MCHC: 33.1 g/dL (ref 31.5–35.7)
MCV: 85 fL (ref 79–97)
Monocytes Absolute: 0.4 10*3/uL (ref 0.1–0.9)
Monocytes: 5 %
Neutrophils Absolute: 5 10*3/uL (ref 1.4–7.0)
Neutrophils: 65 %
Phosphorus: 2.8 mg/dL — ABNORMAL LOW (ref 3.0–4.3)
Platelets: 289 10*3/uL (ref 150–450)
Potassium: 5 mmol/L (ref 3.5–5.2)
RBC: 4.96 x10E6/uL (ref 3.77–5.28)
RDW: 12.3 % (ref 11.7–15.4)
Sodium: 137 mmol/L (ref 134–144)
T3 Uptake Ratio: 20 % — ABNORMAL LOW (ref 24–39)
T4, Total: 15.1 ug/dL — ABNORMAL HIGH (ref 4.5–12.0)
TSH: 1.42 u[IU]/mL (ref 0.450–4.500)
Total Protein: 7 g/dL (ref 6.0–8.5)
Triglycerides: 107 mg/dL (ref 0–149)
Uric Acid: 4.4 mg/dL (ref 2.6–6.2)
VLDL Cholesterol Cal: 19 mg/dL (ref 5–40)
WBC: 7.7 10*3/uL (ref 3.4–10.8)
eGFR: 114 mL/min/{1.73_m2} (ref >59)
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
## 2021-05-26 ENCOUNTER — Ambulatory Visit: Payer: Self-pay | Admitting: Physician Assistant

## 2021-11-04 ENCOUNTER — Encounter (INDEPENDENT_AMBULATORY_CARE_PROVIDER_SITE_OTHER): Payer: Self-pay

## 2022-01-13 DIAGNOSIS — F331 Major depressive disorder, recurrent, moderate: Secondary | ICD-10-CM | POA: Diagnosis not present

## 2022-03-06 ENCOUNTER — Other Ambulatory Visit: Payer: Self-pay

## 2022-03-06 ENCOUNTER — Emergency Department
Admission: EM | Admit: 2022-03-06 | Discharge: 2022-03-07 | Disposition: A | Discharge status: Home / Self Care | Payer: Medicaid Other | Attending: Emergency Medicine | Admitting: Emergency Medicine

## 2022-03-06 DIAGNOSIS — K8500 Idiopathic acute pancreatitis without necrosis or infection: Secondary | ICD-10-CM | POA: Diagnosis not present

## 2022-03-06 DIAGNOSIS — R1010 Upper abdominal pain, unspecified: Secondary | ICD-10-CM | POA: Diagnosis present

## 2022-03-06 DIAGNOSIS — Z8616 Personal history of COVID-19: Secondary | ICD-10-CM | POA: Diagnosis not present

## 2022-03-06 LAB — COMPREHENSIVE METABOLIC PANEL
ALT: 120 U/L — ABNORMAL HIGH (ref 0–44)
AST: 94 U/L — ABNORMAL HIGH (ref 15–41)
Albumin: 3.9 g/dL (ref 3.5–5.0)
Alkaline Phosphatase: 55 U/L (ref 38–126)
Anion gap: 12 (ref 5–15)
BUN: 11 mg/dL (ref 6–20)
CO2: 22 mmol/L (ref 22–32)
Calcium: 9.6 mg/dL (ref 8.9–10.3)
Chloride: 105 mmol/L (ref 98–111)
Creatinine, Ser: 0.84 mg/dL (ref 0.44–1.00)
GFR, Estimated: >60 mL/min (ref >60)
Glucose, Bld: 138 mg/dL — ABNORMAL HIGH (ref 70–99)
Potassium: 4.1 mmol/L (ref 3.5–5.1)
Sodium: 139 mmol/L (ref 135–145)
Total Bilirubin: 1.1 mg/dL (ref 0.3–1.2)
Total Protein: 7.8 g/dL (ref 6.5–8.1)

## 2022-03-06 LAB — CBC
HCT: 45.7 % (ref 36.0–46.0)
Hemoglobin: 14.8 g/dL (ref 12.0–15.0)
MCH: 28.3 pg (ref 26.0–34.0)
MCHC: 32.4 g/dL (ref 30.0–36.0)
MCV: 87.4 fL (ref 80.0–100.0)
Platelets: 181 10*3/uL (ref 150–400)
RBC: 5.23 MIL/uL — ABNORMAL HIGH (ref 3.87–5.11)
RDW: 12.9 % (ref 11.5–15.5)
WBC: 7.1 10*3/uL (ref 4.0–10.5)
nRBC: 0 % (ref 0.0–0.2)

## 2022-03-06 LAB — URINALYSIS, ROUTINE W REFLEX MICROSCOPIC
Glucose, UA: NEGATIVE mg/dL
Hgb urine dipstick: NEGATIVE
Ketones, ur: 20 mg/dL — AB
Nitrite: NEGATIVE
Protein, ur: 30 mg/dL — AB
Specific Gravity, Urine: 1.03 (ref 1.005–1.030)
pH: 5 (ref 5.0–8.0)

## 2022-03-06 LAB — POC URINE PREG, ED: Preg Test, Ur: NEGATIVE

## 2022-03-06 LAB — LIPASE, BLOOD: Lipase: 145 U/L — ABNORMAL HIGH (ref 11–51)

## 2022-03-06 NOTE — ED Triage Notes (Signed)
Pt reports she took Covid test on Wednesday and came positive, reports today she developed epigastric pain while she was in the the shower about 2 hrs ago. Pt reports some nausea, denies any vomiting. Pt talks in complete sentences no respiratory distress noted

## 2022-03-07 ENCOUNTER — Emergency Department: Payer: Medicaid Other

## 2022-03-07 MED ORDER — OXYCODONE-ACETAMINOPHEN 5-325 MG PO TABS: 2.0000 | ORAL_TABLET | Freq: Four times a day (QID) | ORAL | 0 refills | Status: DC | PRN

## 2022-03-07 MED ORDER — ONDANSETRON 4 MG PO TBDP: ORAL_TABLET | ORAL | 0 refills | Status: DC

## 2022-03-07 MED ORDER — DOCUSATE SODIUM 100 MG PO CAPS: ORAL_CAPSULE | ORAL | 0 refills | Status: DC

## 2022-03-07 NOTE — ED Provider Notes (Signed)
[facility] Provider Note    Event Date/Time   First MD Initiated Contact with Patient 03/06/22 2351     (approximate)   History   Abdominal Pain   HPI  Lisa Logan is a 31 y.o. female with no significant chronic medical history is other than rare complex migraines.  She presents for evaluation of about 6 hours of pain in the upper abdomen.  She said that she felt pretty good today and spite of testing herself for COVID 2 days ago and testing positive.  She tested because of some general malaise and nasal congestion but has not had any significant respiratory symptoms.  Today around 6 PM she had acute onset of pain in the upper part of her abdomen that was sharp and aching.  She lied down to see if it would go away but it seemed to get worse and became more sharp in the upper part of her abdomen.  She then developed a taste of acid in her mouth and throat.  She had a little bit of nausea initially with the pain.  No vomiting and no diarrhea.  She said that while she was waiting in the waiting room, the pain and the nausea actually got better.  Currently she has a little bit of the burning sensation in her throat and upper abdomen but not really any pain.  She drank some wine 4 days ago but has not had any since then.  She does not drink regularly.  No new recent medications including no antivirals for COVID.  She has been taking some Mucinex for her congestion.  She has never been told that she has gallstones.  No recent trauma.     Physical Exam   Triage Vital Signs: ED Triage Vitals [03/06/22 2102]  Enc Vitals Group     BP 109/78     Pulse Rate 100     Resp (!) 22     Temp 97.6 F (36.4 C)     Temp Source Oral     SpO2 100 %     Weight 97.5 kg (215 lb)     Height 1.575 m (5\' 2" )     Head Circumference      Peak Flow      Pain Score 7     Pain Loc      Pain Edu?      Excl. in GC?     Most recent vital signs: Vitals:   03/07/22 0130 03/07/22  0145  BP: 101/77   Pulse: (!) 48 70  Resp: (!) 28 (!) 24  Temp:    SpO2: 99% 96%     General: Awake, no distress.  CV:  Good peripheral perfusion.  Normal heart sounds.  Regular rate and rhythm. Resp:  Normal effort.  Lungs are clear to auscultation bilaterally. Abd:  Obese.  No distention.  No clinically significant tenderness to palpation in the abdomen including the epigastrium and right upper quadrant with negative Murphy sign.  The patient states that the epigastrium is where it was hurting earlier but currently does not feel bad.   ED Results / Procedures / Treatments   Labs (all labs ordered are listed, but only abnormal results are displayed) Labs Reviewed  LIPASE, BLOOD - Abnormal; Notable for the following components:      Result Value   Lipase 145 (*)    All other components within normal limits  COMPREHENSIVE METABOLIC PANEL - Abnormal; Notable for the following components:  Glucose, Bld 138 (*)    AST 94 (*)    ALT 120 (*)    All other components within normal limits  CBC - Abnormal; Notable for the following components:   RBC 5.23 (*)    All other components within normal limits  URINALYSIS, ROUTINE W REFLEX MICROSCOPIC - Abnormal; Notable for the following components:   Color, Urine AMBER (*)    APPearance CLOUDY (*)    Bilirubin Urine MODERATE (*)    Ketones, ur 20 (*)    Protein, ur 30 (*)    Leukocytes,Ua SMALL (*)    Bacteria, UA RARE (*)    All other components within normal limits  POC URINE PREG, ED     EKG  ED ECG REPORT I, Hinda Kehr, the attending physician, personally viewed and interpreted this ECG.  Date: 03/06/2022 EKG Time: 21: 11 Rate: 83 Rhythm: normal sinus rhythm QRS Axis: normal Intervals: normal ST/T Wave abnormalities: normal Narrative Interpretation: no evidence of acute ischemia    RADIOLOGY I viewed and interpreted the patient's right upper quadrant ultrasound.  See hospital course for details, but no acute  abnormalities are identified.    PROCEDURES:  Critical Care performed: No  Procedures   MEDICATIONS ORDERED IN ED: Medications - No data to display   IMPRESSION / MDM / [place] / ED COURSE  I reviewed the triage vital signs and the nursing notes.                              Differential diagnosis includes, but is not limited to, acid reflux, pancreatitis, biliary disease, less likely retrocecal appendix with appendicitis.  Very unlikely to represent ACS.  Patient's presentation is most consistent with acute presentation with potential threat to life or bodily function.  Labs/studies ordered: CMP, lipase, CBC, urinalysis, urine pregnancy test, EKG.  The patient is on the cardiac monitor to evaluate for any changes of rhythm or rate that may indicate an acute cardiac issue.  EKG shows no evidence of ischemia.  Vital signs are generally reassuring.  She was borderline tachycardic and borderline tachypneic when she checked in but that was when she was more comfortable.  Her vital signs of normalized during my assessment now that she feels essentially asymptomatic.  Labs are notable for cloudy urine with some bilirubinuria, some white cells and bacteria and small leukocytes but without any urinary symptoms.  I am ordering a urine culture but will not treat empirically.  She has a mild bump in her LFTs with an AST of 94 and ALT of 120 but a normal total bilirubin.  Of note, however, her lipase is elevated at 145.  I talked with the patient about next steps particularly given that she is no longer symptomatic and has no clinically significant tenderness to palpation.  We agreed to proceed with ultrasound of the right upper quadrant to evaluate for the possibility of biliary disease leading to a mild pancreatitis.  Depending on the results, we can consider whether to proceed with a CT scan.  We also considered hospitalization versus conservative outpatient management and we will  discuss again after reassessment after the ultrasound.  I offered analgesia and antiemetics but she declined at this time.   Clinical Course as of 03/07/22 0249  Sun Mar 07, 2022  0237 US ABDOMEN LIMITED RUQ (LIVER/GB) I viewed and interpreted the patient's ultrasound.  No evidence of gallbladder disease.  I reassessed the  patient.  She says she feels good.  She has a little bit of what she describes as acid reflux, but no persistent abdominal pain and no nausea.  Again, I considered hospitalization for pancreatitis, but her symptoms are well-controlled if she wants to go home.  I gave her my usual and customary pancreatitis discussion including strict return precautions.  She understands and agrees with the plan.  Prescriptions as listed below after I checked the [place] controlled substance database to make sure that she had no worrisome prescribing pattern. [CF]    Clinical Course User Index [CF] Hinda Kehr, MD     FINAL CLINICAL IMPRESSION(S) / ED DIAGNOSES   Final diagnoses:  Idiopathic acute pancreatitis, unspecified complication status     Rx / DC Orders   ED Discharge Orders          Ordered    oxyCODONE-acetaminophen (PERCOCET) 5-325 MG tablet  Every 6 hours PRN        03/07/22 0248    ondansetron (ZOFRAN-ODT) 4 MG disintegrating tablet        03/07/22 0248    docusate sodium (COLACE) 100 MG capsule        03/07/22 0248             Note:  This document was prepared using Dragon voice recognition software and may include unintentional dictation errors.   Hinda Kehr, MD 03/07/22 [id number]

## 2022-03-07 NOTE — Discharge Instructions (Addendum)
As we discussed, your workup and physical exam today suggest that your symptoms are caused by a condition called pancreatitis.  Your symptoms are relatively mild, and we all agree that you will likely improve at home.  However, you need to stick to the eating plan recommendations included in these documents.  Please also read through the information about pancreatitis in general.  Avoid drinking alcohol.    Take Percocet as prescribed for severe pain. Do not drink alcohol, drive or participate in any other potentially dangerous activities while taking this medication as it may make you sleepy. Do not take this medication with any other sedating medications, either prescription or over-the-counter. If you were prescribed Percocet or Vicodin, do not take these with acetaminophen (Tylenol) as it is already contained within these medications.   This medication is an opiate (or narcotic) pain medication and can be habit forming.  Use it as little as possible to achieve adequate pain control.  Do not use or use it with extreme caution if you have a history of opiate abuse or dependence.  If you are on a pain contract with your primary care doctor or a pain specialist, be sure to let them know you were prescribed this medication today from the [facility] Emergency Department.  This medication is intended for your use only - do not give any to anyone else and keep it in a secure place where nobody else, especially children, have access to it.  It will also cause or worsen constipation, so you may want to consider taking an over-the-counter stool softener while you are taking this medication.  Return to the emergency department if you develop new or worsening symptoms that concern you, including but not limited to worsening abdominal pain, persistent vomiting, fever, or inability to eat or drink anything as a result of the other symptoms.

## 2022-03-09 ENCOUNTER — Other Ambulatory Visit (HOSPITAL_COMMUNITY)
Admission: RE | Admit: 2022-03-09 | Discharge: 2022-03-09 | Disposition: A | Discharge status: Home / Self Care | Payer: Medicaid Other | Source: Ambulatory Visit | Attending: Obstetrics and Gynecology | Admitting: Obstetrics and Gynecology

## 2022-03-09 ENCOUNTER — Encounter: Payer: Self-pay | Admitting: Obstetrics and Gynecology

## 2022-03-09 ENCOUNTER — Ambulatory Visit (INDEPENDENT_AMBULATORY_CARE_PROVIDER_SITE_OTHER): Payer: Medicaid Other | Admitting: Obstetrics and Gynecology

## 2022-03-09 VITALS — BP 110/70 | Ht 62.0 in | Wt 215.0 lb

## 2022-03-09 DIAGNOSIS — R87612 Low grade squamous intraepithelial lesion on cytologic smear of cervix (LGSIL): Secondary | ICD-10-CM | POA: Insufficient documentation

## 2022-03-09 DIAGNOSIS — Z01419 Encounter for gynecological examination (general) (routine) without abnormal findings: Secondary | ICD-10-CM

## 2022-03-09 DIAGNOSIS — Z3041 Encounter for surveillance of contraceptive pills: Secondary | ICD-10-CM | POA: Diagnosis not present

## 2022-03-09 DIAGNOSIS — Z124 Encounter for screening for malignant neoplasm of cervix: Secondary | ICD-10-CM | POA: Insufficient documentation

## 2022-03-09 DIAGNOSIS — Z1151 Encounter for screening for human papillomavirus (HPV): Secondary | ICD-10-CM

## 2022-03-09 NOTE — Patient Instructions (Signed)
I value your feedback and you entrusting us with your care. If you get a [facility] patient survey, I would appreciate you taking the time to let us know about your experience today. Thank you! ? ? ?

## 2022-03-09 NOTE — Progress Notes (Signed)
PCP:  Pcp, No   Chief Complaint  Patient presents with   Gynecologic Exam    No concerns     HPI:      [name] is a 31 y.o. G0P0000 whose LMP was No LMP recorded (lmp unknown). (Menstrual status: Oral contraceptives)., presents today for her annual examination.  Currently on OCPs with estrogen from ObGyn in [place] last yr. Pt does cont dosing and is amenorrheic, no BTB/dysmen. Was having BTB with camila POPs; we had discussed changing to different POP vs depo vs IUD, pt not interested. Changed to POPs in past due to hx of complex migraines in high school with episode of babbling, facial numbness, and paralysis on 1 side of her body. Was hospitalized and had neg neuro workup. 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  Didn't have complex migraines with POPs. Hx of menometrorrhagia in past and did OCPs with sx relief. Prefers not to have any bleeding.  Sex activity: not currently sexually active; contraception - OCPs. Last Pap: 02/28/20 and 07/18/19  Results were: low-grade squamous intraepithelial neoplasia (LGSIL - encompassing HPV,mild dysplasia,CIN I) /POS HPV DNA. Colpo 1/22 with Dr. Bonney Aid with LGSIL/CIN 1, repeat pap in 1 yr recommended. 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 LEEP path with CIN1-2 with an unclear margin.   Hx of STDs: HPV  There is no FH of breast cancer. There is no FH of ovarian cancer. The patient does not do self-breast exams.  Tobacco use: The patient denies current or previous tobacco use. Alcohol use: social drinker No drug use.  Exercise: min active  She does get adequate calcium but not Vitamin D in her diet.   Gardasil completed  Past Medical History:  Diagnosis Date   Abnormal Pap smear of cervix    Anxiety    Arm fracture    C.  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    Lactose intolerance    Migraine    Obesity    SOB (shortness of breath)    Vaccine for human papilloma virus (HPV) types 6, 11, 16, and 18 administered    Vitamin D deficiency     Past Surgical History:  Procedure Laterality Date   LEEP  2019   CIN 1-2, unclear margins    Family History  Problem Relation Age of Onset   Arthritis Mother    Mental illness Mother    Cancer Mother        skin    Migraines Mother    Depression Mother    High blood pressure Mother    Bipolar disorder Mother    Obesity Mother    Skin cancer Mother        not melanoma   Alcohol abuse Father    Hypertension Father    Mental illness Father    Depression Father    Eating disorder Father    Cancer Maternal Aunt        Bone   Depression Maternal Aunt    Lung cancer Maternal Aunt    Arthritis Maternal Grandmother    Stroke Maternal Grandmother  Mental illness Maternal Grandmother    Skin cancer Maternal Grandmother    Cancer Maternal Grandmother        skin   Cancer Maternal Grandfather        lung   Mental illness Maternal Grandfather    Cancer Paternal Grandmother        Skin   Depression Sister    Depression Brother    Cancer Maternal Uncle        skin   Depression Maternal Uncle     Social History   Socioeconomic History   Marital status: Married    Spouse name: Maisie Fus   Number of children: 0   Years of education: 12   Highest education level: Not on file  Occupational History   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  Tobacco Use   Smoking status: Never   Smokeless tobacco: Never  Vaping Use   Vaping Use: Never used  Substance and Sexual Activity   Alcohol use: Yes    Comment: occassional   Drug use: No   Sexual activity: Not Currently    Birth control/protection: Pill  Other Topics Concern   Not on file  Social  History Narrative   Married   Works at [employer], [employer]   Right-handed   Caffeine: tea, soda   Social Determinants of Corporate investment banker Strain: Not on file  Food Insecurity: Not on file  Transportation Needs: Not on file  Physical Activity: Not on file  Stress: Not on file  Social Connections: Not on file  Intimate Partner Violence: Not on file     Current Outpatient Medications:    lidocaine (XYLOCAINE) 5 % ointment, Apply a pea sized amount topically 20 minutes prior to intercourse, wipe off just prior to intercourse., Disp: 30 g, Rfl: 0   norethindrone-ethinyl estradiol (LOESTRIN) 1-20 MG-MCG tablet, Take 1 tablet by mouth daily., Disp: , Rfl:      ROS:  Review of Systems  Constitutional:  Negative for fatigue, fever and unexpected weight change.  Respiratory:  Negative for cough, shortness of breath and wheezing.   Cardiovascular:  Negative for chest pain, palpitations and leg swelling.  Gastrointestinal:  Negative for blood in stool, constipation, diarrhea, nausea and vomiting.  Endocrine: Negative for cold intolerance, heat intolerance and polyuria.  Genitourinary:  Negative for dyspareunia, dysuria, flank pain, frequency, genital sores, hematuria, menstrual problem, pelvic pain, urgency, vaginal bleeding, vaginal discharge and vaginal pain.  Musculoskeletal:  Negative for back pain, joint swelling and myalgias.  Skin:  Negative for rash.  Neurological:  Negative for dizziness, syncope, light-headedness, numbness and headaches.  Hematological:  Negative for adenopathy.  Psychiatric/Behavioral:  Negative for agitation, confusion, sleep disturbance and suicidal ideas. The patient is not nervous/anxious.   BREAST: No symptoms   Objective: BP 110/70   Ht 5\' 2"  (1.575 m)   Wt 215 lb (97.5 kg)   LMP  (LMP Unknown)   BMI 39.32 kg/m    Physical Exam Constitutional:      Appearance: She is well-developed.  Genitourinary:     Vulva normal.     Right Labia: No  rash, tenderness or lesions.    Left Labia: No tenderness, lesions or rash.    No vaginal discharge, erythema or tenderness.      Right Adnexa: not tender and no mass present.    Left Adnexa: not tender and no mass present.    No cervical friability or polyp.     Uterus is not enlarged or tender.  Breasts:    Right: No mass, nipple discharge, skin change or tenderness.     Left: No mass, nipple discharge, skin change or tenderness.  Neck:     Thyroid: No thyromegaly.  Cardiovascular:     Rate and Rhythm: Normal rate and regular rhythm.     Heart sounds: Normal heart sounds. No murmur heard. Pulmonary:     Effort: Pulmonary effort is normal.     Breath sounds: Normal breath sounds.  Abdominal:     Palpations: Abdomen is soft.     Tenderness: There is no abdominal tenderness. There is no guarding or rebound.  Musculoskeletal:        General: Normal range of motion.     Cervical back: Normal range of motion.  Lymphadenopathy:     Cervical: No cervical adenopathy.  Neurological:     General: No focal deficit present.     Mental Status: She is alert and oriented to person, place, and time.     Cranial Nerves: No cranial nerve deficit.  Skin:    General: Skin is warm and dry.  Psychiatric:        Mood and Affect: Mood normal.        Behavior: Behavior normal.        Thought Content: Thought content normal.        Judgment: Judgment normal.  Vitals reviewed.     Assessment/Plan: Encounter for annual routine gynecological examination  Cervical cancer screening - Plan: Cytology - PAP  Screening for HPV (human papillomavirus) - Plan: Cytology - PAP  LGSIL on Pap smear of cervix - Plan: Cytology - PAP; repeat pap today. Will call with results and mgmt.   Encounter for surveillance of contraceptive pills--discussed concern of estrogen OCPs with hx of complex migraines with neuro sx. Pt and I will reach out to neuro to get approval. Pt hasn't seen neuro in a few yrs. If can't  get approval, told pt I'm not comfortable doing estrogen products and she would have to f/u back in [place]. Could do aygestin since pt doesn't want periods. Pt not interested in depo, nexplanon, IUD.            GYN counsel family planning choices, adequate intake of calcium and vitamin D, diet and exercise     F/U  Return in about 1 year (around 03/10/2023).  Carmina Walle B. Jeniah Kishi, PA-C 03/09/2022 4:55 PM

## 2022-03-10 ENCOUNTER — Encounter: Payer: Self-pay | Admitting: Obstetrics and Gynecology

## 2022-03-16 LAB — CYTOLOGY - PAP
Adequacy: ABSENT
Comment: NEGATIVE
High risk HPV: POSITIVE — AB

## 2022-04-19 DIAGNOSIS — Z01419 Encounter for gynecological examination (general) (routine) without abnormal findings: Secondary | ICD-10-CM | POA: Diagnosis not present

## 2022-04-19 DIAGNOSIS — U071 COVID-19: Secondary | ICD-10-CM | POA: Insufficient documentation

## 2022-04-19 DIAGNOSIS — Z1371 Encounter for nonprocreative screening for genetic disease carrier status: Secondary | ICD-10-CM | POA: Diagnosis not present

## 2022-04-19 DIAGNOSIS — R87810 Cervical high risk human papillomavirus (HPV) DNA test positive: Secondary | ICD-10-CM | POA: Diagnosis not present

## 2022-04-19 DIAGNOSIS — Z803 Family history of malignant neoplasm of breast: Secondary | ICD-10-CM | POA: Diagnosis not present

## 2022-04-19 DIAGNOSIS — Z1331 Encounter for screening for depression: Secondary | ICD-10-CM | POA: Diagnosis not present

## 2022-05-13 ENCOUNTER — Other Ambulatory Visit: Payer: Self-pay | Admitting: Nurse Practitioner

## 2022-05-13 ENCOUNTER — Ambulatory Visit: Payer: 59 | Admitting: Nurse Practitioner

## 2022-05-13 ENCOUNTER — Encounter: Payer: Self-pay | Admitting: Nurse Practitioner

## 2022-05-13 VITALS — BP 114/78 | HR 71 | Temp 98.6°F | Ht 62.0 in | Wt 200.4 lb

## 2022-05-13 DIAGNOSIS — F322 Major depressive disorder, single episode, severe without psychotic features: Secondary | ICD-10-CM | POA: Diagnosis not present

## 2022-05-13 DIAGNOSIS — E669 Obesity, unspecified: Secondary | ICD-10-CM

## 2022-05-13 DIAGNOSIS — E559 Vitamin D deficiency, unspecified: Secondary | ICD-10-CM | POA: Diagnosis not present

## 2022-05-13 DIAGNOSIS — F419 Anxiety disorder, unspecified: Secondary | ICD-10-CM

## 2022-05-13 DIAGNOSIS — G43001 Migraine without aura, not intractable, with status migrainosus: Secondary | ICD-10-CM

## 2022-05-13 DIAGNOSIS — K760 Fatty (change of) liver, not elsewhere classified: Secondary | ICD-10-CM

## 2022-05-13 DIAGNOSIS — R748 Abnormal levels of other serum enzymes: Secondary | ICD-10-CM

## 2022-05-13 DIAGNOSIS — R7989 Other specified abnormal findings of blood chemistry: Secondary | ICD-10-CM

## 2022-05-13 DIAGNOSIS — F5104 Psychophysiologic insomnia: Secondary | ICD-10-CM | POA: Diagnosis not present

## 2022-05-13 DIAGNOSIS — Z7689 Persons encountering health services in other specified circumstances: Secondary | ICD-10-CM

## 2022-05-13 LAB — COMPREHENSIVE METABOLIC PANEL
ALT: 62 U/L — ABNORMAL HIGH (ref 0–35)
AST: 47 U/L — ABNORMAL HIGH (ref 0–37)
Albumin: 3.9 g/dL (ref 3.5–5.2)
Alkaline Phosphatase: 37 U/L — ABNORMAL LOW (ref 39–117)
BUN: 7 mg/dL (ref 6–23)
CO2: 28 mEq/L (ref 19–32)
Calcium: 9.5 mg/dL (ref 8.4–10.5)
Chloride: 105 mEq/L (ref 96–112)
Creatinine, Ser: 0.87 mg/dL (ref 0.40–1.20)
GFR: 89.57 mL/min (ref >60.00)
Glucose, Bld: 75 mg/dL (ref 70–99)
Potassium: 4.6 mEq/L (ref 3.5–5.1)
Sodium: 140 mEq/L (ref 135–145)
Total Bilirubin: 0.5 mg/dL (ref 0.2–1.2)
Total Protein: 6.5 g/dL (ref 6.0–8.3)

## 2022-05-13 LAB — CBC
HCT: 39.7 % (ref 36.0–46.0)
Hemoglobin: 12.9 g/dL (ref 12.0–15.0)
MCHC: 32.6 g/dL (ref 30.0–36.0)
MCV: 87.4 fl (ref 78.0–100.0)
Platelets: 183 10*3/uL (ref 150.0–400.0)
RBC: 4.54 Mil/uL (ref 3.87–5.11)
RDW: 13.4 % (ref 11.5–15.5)
WBC: 6.6 10*3/uL (ref 4.0–10.5)

## 2022-05-13 LAB — TSH: TSH: 2.15 u[IU]/mL (ref 0.35–5.50)

## 2022-05-13 LAB — HEMOGLOBIN A1C: Hgb A1c MFr Bld: 4.9 % (ref 4.6–6.5)

## 2022-05-13 LAB — LIPASE: Lipase: 8 U/L — ABNORMAL LOW (ref 11.0–59.0)

## 2022-05-13 LAB — VITAMIN D 25 HYDROXY (VIT D DEFICIENCY, FRACTURES): VITD: 54.08 ng/mL (ref 30.00–100.00)

## 2022-05-13 MED ORDER — FLUOXETINE HCL 10 MG PO TABS: ORAL_TABLET | ORAL | 0 refills | Status: DC

## 2022-05-13 NOTE — Assessment & Plan Note (Signed)
Patient is working on lifestyle modifications.  Continue

## 2022-05-13 NOTE — Assessment & Plan Note (Signed)
PHQ-9 0 send administered office.  Will start patient back on fluoxetine 10 mg for 2 weeks then bump up to fluoxetine 20 mg thereafter.  Follow-up 6 weeks sooner if needed

## 2022-05-13 NOTE — Assessment & Plan Note (Signed)
Historical diagnosis per patient report she is taking 5000 IUs daily.  Pending vitamin D level today

## 2022-05-13 NOTE — Assessment & Plan Note (Signed)
PHQ-9 and GAD-7 administered in office.  Patient has passive SI but no active SI or plan of SI.  Patient has been hospitalized in the past for mental health inclusive of SI.  Will start patient on fluoxetine 10 mg for 2 weeks but up to 20 mg thereafter.  Ambulatory referral to psychology to get patient enrolled in therapy.  Patient was given emergency resources inclusive of "41", agrees with behavioral health urgent care and local emergency department as needed.  Follow-up 6 weeks sooner if needed

## 2022-05-13 NOTE — Assessment & Plan Note (Signed)
Likely secondary to anxiety.  Patient placed on fluoxetine pending follow-up

## 2022-05-13 NOTE — Assessment & Plan Note (Signed)
Work on healthy lifestyle modifications pending LFTs today

## 2022-05-13 NOTE — Assessment & Plan Note (Signed)
Radiological finding on CT scan in 2019.  LFTs elevated recently.  Patient is working on lifestyle modifications she does have a follow-up appointment scheduled with GI pending labs today

## 2022-05-13 NOTE — Telephone Encounter (Signed)
Patients insurance will cover capsules and not the tablets. They are needing the script to be updated. Thank you!

## 2022-05-13 NOTE — Assessment & Plan Note (Signed)
Patient has a history of migraines.  Has been in remission as of late she does have headaches on occasion.  She is never been on medication for abortive or preventative therapy.  Patient can take over-the-counter NSAID to help with her migraines.  Stable

## 2022-05-13 NOTE — Patient Instructions (Addendum)
Nice to see you today I will be in touch with the labs I have sent a referral for therapy and medication to the pharmacy Follow up with me in 6 weeks, sooner if you need me  [facility]  Address: [address] [place], [place] [address] Hours:  Open 24 hours Phone: [phone]

## 2022-05-13 NOTE — Progress Notes (Signed)
New Patient Office Visit  Subjective    Patient ID: Lisa Logan, female    DOB: 04-22-1990  Age: 32 y.o. MRN: [id number]  CC:  Chief Complaint  Patient presents with   Establish Care   Depression    Anxiety    HPI Lisa Logan presents to establish care   MDD/GAD: has been on fluoxetine, paroxetine, lorazepam, Effexor, Zoloft in the past. States that it was hit or miss states that she was doing it through the tele doc. States that she felt that it did put a dint into it States that she has seen psych. States that she has been in therapy Has been hospitalized in highschool. That was high school. States that she has had passive SI.  Migraine: States that not been around as of recent. She will get some headaches. Traditionally front and back of the head. Has gotten to the point of vomiting and nausea, Some sound sense. States no prescription meds but goody powders  Seizure: was in high school. Complex migraine. Evaluated by neurology. States that strobing and flashing lights will make her head hurt  Vitamin D: history of a deficency. States that she does take 5,000IU daily  Insomnia: States that she has trouble going to bed or stayig asleep. Feels that her mind is racing and running. States that States that with 8 hours she feels tired and told that she snores   Abnormal pap: normal 02/2022 and 03/2022. She is scheduled for a colposcopy. Patient has a history of LEEP in the past    LFT: noticed in recent blood work has been elevated in the past.  Did review of CT from 2019 that showed hepatic steatosis.  Patient does have a follow-up with GI next couple months.  Patient is working on lifestyle modifications currently  Outpatient Encounter Medications as of 05/13/2022  Medication Sig   augmented betamethasone dipropionate (DIPROLENE-AF) 0.05 % cream Apply topically 2 (two) times daily.   FLUoxetine (PROZAC) 10 MG tablet Take 1 tablet (10 mg total) by mouth daily for 15 days, THEN 2  tablets (20 mg total) daily for 15 days.   norethindrone-ethinyl estradiol (LOESTRIN) 1-20 MG-MCG tablet Take 1 tablet by mouth daily.   lidocaine (XYLOCAINE) 5 % ointment Apply a pea sized amount topically 20 minutes prior to intercourse, wipe off just prior to intercourse. (Patient not taking: Reported on 05/13/2022)   No facility-administered encounter medications on file as of 05/13/2022.    Past Medical History:  Diagnosis Date   Abnormal Pap smear of cervix    Anxiety    Arm fracture    C. difficile colitis    Depression    Epilepsy ([place])    High grade squamous intraepithelial lesion (HGSIL), grade 3 CIN, on biopsy of cervix    HPV in female    Lactose intolerance    Migraine    Obesity    SOB (shortness of breath)    Vaccine for human papilloma virus (HPV) types 6, 11, 16, and 18 administered    Vitamin D deficiency     Past Surgical History:  Procedure Laterality Date   LEEP  2019   CIN 1-2, unclear margins    Family History  Problem Relation Age of Onset   Arthritis Mother    Mental illness Mother    Cancer Mother        skin    Migraines Mother    Depression Mother    High blood pressure Mother    Bipolar  disorder Mother    Obesity Mother    Skin cancer Mother        not melanoma   Alcohol abuse Father    Hypertension Father    Mental illness Father    Depression Father    Eating disorder Father    Cancer Maternal Aunt        Bone   Depression Maternal Aunt    Lung cancer Maternal Aunt    Arthritis Maternal Grandmother    Stroke Maternal Grandmother    Mental illness Maternal Grandmother    Skin cancer Maternal Grandmother    Cancer Maternal Grandmother        skin   Cancer Maternal Grandfather        lung   Mental illness Maternal Grandfather    Cancer Paternal Grandmother        Skin   Depression Sister    Depression Brother    Cancer Maternal Uncle        skin   Depression Maternal Uncle     Social History   Socioeconomic History    Marital status: Married    Spouse name: Lisa Logan   Number of children: 0   Years of education: 12   Highest education level: Not on file  Occupational History   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  Tobacco Use   Smoking status: Never   Smokeless tobacco: Never  Vaping Use   Vaping Use: Never used  Substance and Sexual Activity   Alcohol use: Not Currently    Comment: occassional   Drug use: No   Sexual activity: Not Currently    Birth control/protection: Pill  Other Topics Concern   Not on file  Social History Narrative   Married   Works at [employer], [employer]   Right-handed   Caffeine: tea, soda      Hobbies: makes model and plays video games    Social Determinants of Health   Financial Resource Strain: Not on file  Food Insecurity: Not on file  Transportation Needs: Not on file  Physical Activity: Not on file  Stress: Not on file  Social Connections: Not on file  Intimate Partner Violence: Not on file    Review of Systems  Constitutional:  Negative for chills and fever.  Respiratory:  Negative for shortness of breath.   Cardiovascular:  Negative for chest pain and leg swelling.  Gastrointestinal:  Negative for abdominal pain, blood in stool, constipation, diarrhea, nausea and vomiting.       BM daily  Genitourinary:  Negative for dysuria and hematuria.  Neurological:  Negative for tingling and headaches.  Psychiatric/Behavioral:  Negative for hallucinations and suicidal ideas.         Objective    BP 114/78   Pulse 71   Temp 98.6 F (37 C) (Oral)   Ht 5' 2"$  (1.575 m)   Wt 200 lb 6.4 oz (90.9 kg)   LMP  (LMP Unknown)   SpO2 98%   BMI 36.65 kg/m   Physical Exam Vitals and nursing note reviewed.  Constitutional:      Appearance: Normal appearance.  HENT:     Right Ear: Tympanic membrane, ear canal and external ear normal.     Left Ear: Tympanic membrane, ear canal and external ear normal.     Mouth/Throat:     Mouth: Mucous membranes are moist.     Pharynx:  Oropharynx is clear.  Eyes:     Extraocular Movements: Extraocular movements intact.  Pupils: Pupils are equal, round, and reactive to light.  Cardiovascular:     Rate and Rhythm: Normal rate and regular rhythm.     Pulses: Normal pulses.     Heart sounds: Normal heart sounds.  Pulmonary:     Effort: Pulmonary effort is normal.     Breath sounds: Normal breath sounds.  Abdominal:     General: Bowel sounds are normal. There is no distension.     Palpations: There is no mass.     Tenderness: There is no abdominal tenderness.     Hernia: No hernia is present.  Musculoskeletal:     Right lower leg: No edema.     Left lower leg: No edema.  Lymphadenopathy:     Cervical: No cervical adenopathy.  Skin:    General: Skin is warm.  Neurological:     General: No focal deficit present.     Mental Status: She is alert.     Deep Tendon Reflexes:     Reflex Scores:      Bicep reflexes are 2+ on the right side and 2+ on the left side.      Patellar reflexes are 2+ on the right side and 2+ on the left side.    Comments: Bilateral upper and lower extremity strength 5/5  Psychiatric:        Mood and Affect: Mood normal.        Behavior: Behavior normal.        Thought Content: Thought content normal.        Judgment: Judgment normal.         Assessment & Plan:   Problem List Items Addressed This Visit       Cardiovascular and Mediastinum   Migraine    Patient has a history of migraines.  Has been in remission as of late she does have headaches on occasion.  She is never been on medication for abortive or preventative therapy.  Patient can take over-the-counter NSAID to help with her migraines.  Stable      Relevant Medications   FLUoxetine (PROZAC) 10 MG tablet     Digestive   Hepatic steatosis    Radiological finding on CT scan in 2019.  LFTs elevated recently.  Patient is working on lifestyle modifications she does have a follow-up appointment scheduled with GI pending labs  today        Other   Anxiety    PHQ-9 0 send administered office.  Will start patient back on fluoxetine 10 mg for 2 weeks then bump up to fluoxetine 20 mg thereafter.  Follow-up 6 weeks sooner if needed      Relevant Medications   FLUoxetine (PROZAC) 10 MG tablet   Other Relevant Orders   TSH   Ambulatory referral to Psychology   Depression    PHQ-9 and GAD-7 administered in office.  Patient has passive SI but no active SI or plan of SI.  Patient has been hospitalized in the past for mental health inclusive of SI.  Will start patient on fluoxetine 10 mg for 2 weeks but up to 20 mg thereafter.  Ambulatory referral to psychology to get patient enrolled in therapy.  Patient was given emergency resources inclusive of "64", agrees with behavioral health urgent care and local emergency department as needed.  Follow-up 6 weeks sooner if needed      Relevant Medications   FLUoxetine (PROZAC) 10 MG tablet   Other Relevant Orders   Ambulatory referral to Psychology  Elevated LFTs    Work on healthy lifestyle modifications pending LFTs today      Relevant Orders   CBC   Comprehensive metabolic panel   Insomnia    Likely secondary to anxiety.  Patient placed on fluoxetine pending follow-up      Relevant Orders   CBC   Comprehensive metabolic panel   TSH   Vitamin D deficiency    Historical diagnosis per patient report she is taking 5000 IUs daily.  Pending vitamin D level today      Relevant Orders   VITAMIN D 25 Hydroxy (Vit-D Deficiency, Fractures)   Obesity (BMI 30-39.9)    Patient is working on lifestyle modifications.  Continue      Relevant Orders   CBC   Comprehensive metabolic panel   Hemoglobin A1c   TSH   Elevated lipase    Incidental finding of emergency department.  Will recheck today.  Patient does have an appointment scheduled with GI for future      Relevant Orders   Lipase   Other Visit Diagnoses     Encounter to establish care    -  Primary        Return in about 6 weeks (around 06/24/2022) for MDD/GAD medication recheck .   Romilda Garret, NP

## 2022-05-13 NOTE — Assessment & Plan Note (Signed)
Incidental finding of emergency department.  Will recheck today.  Patient does have an appointment scheduled with GI for future

## 2022-05-14 ENCOUNTER — Other Ambulatory Visit: Payer: Self-pay | Admitting: Nurse Practitioner

## 2022-05-14 DIAGNOSIS — F419 Anxiety disorder, unspecified: Secondary | ICD-10-CM

## 2022-05-14 DIAGNOSIS — F322 Major depressive disorder, single episode, severe without psychotic features: Secondary | ICD-10-CM

## 2022-05-14 MED ORDER — FLUOXETINE HCL 10 MG PO CAPS: ORAL_CAPSULE | ORAL | 0 refills | Status: DC

## 2022-05-28 DIAGNOSIS — Z3202 Encounter for pregnancy test, result negative: Secondary | ICD-10-CM | POA: Diagnosis not present

## 2022-05-28 DIAGNOSIS — R87810 Cervical high risk human papillomavirus (HPV) DNA test positive: Secondary | ICD-10-CM | POA: Diagnosis not present

## 2022-06-05 ENCOUNTER — Other Ambulatory Visit: Payer: Self-pay | Admitting: Nurse Practitioner

## 2022-06-05 DIAGNOSIS — F419 Anxiety disorder, unspecified: Secondary | ICD-10-CM

## 2022-06-05 DIAGNOSIS — F322 Major depressive disorder, single episode, severe without psychotic features: Secondary | ICD-10-CM

## 2022-06-08 ENCOUNTER — Other Ambulatory Visit: Payer: Self-pay | Admitting: Nurse Practitioner

## 2022-06-08 DIAGNOSIS — F419 Anxiety disorder, unspecified: Secondary | ICD-10-CM

## 2022-06-08 MED ORDER — FLUOXETINE HCL 20 MG PO CAPS: 20.0000 mg | ORAL_CAPSULE | Freq: Every day | ORAL | 0 refills | Status: DC

## 2022-06-12 ENCOUNTER — Other Ambulatory Visit: Payer: Self-pay | Admitting: Nurse Practitioner

## 2022-06-12 DIAGNOSIS — F419 Anxiety disorder, unspecified: Secondary | ICD-10-CM

## 2022-06-15 ENCOUNTER — Other Ambulatory Visit (HOSPITAL_COMMUNITY): Payer: Self-pay

## 2022-06-24 ENCOUNTER — Ambulatory Visit (INDEPENDENT_AMBULATORY_CARE_PROVIDER_SITE_OTHER): Payer: 59 | Admitting: Nurse Practitioner

## 2022-06-24 VITALS — BP 108/62 | HR 68 | Temp 97.8°F | Resp 16 | Ht 62.0 in | Wt 185.1 lb

## 2022-06-24 DIAGNOSIS — F5104 Psychophysiologic insomnia: Secondary | ICD-10-CM | POA: Diagnosis not present

## 2022-06-24 DIAGNOSIS — F419 Anxiety disorder, unspecified: Secondary | ICD-10-CM | POA: Diagnosis not present

## 2022-06-24 DIAGNOSIS — F322 Major depressive disorder, single episode, severe without psychotic features: Secondary | ICD-10-CM | POA: Diagnosis not present

## 2022-06-24 MED ORDER — TRAZODONE HCL 50 MG PO TABS: 25.0000 mg | ORAL_TABLET | Freq: Every evening | ORAL | 0 refills | Status: DC | PRN

## 2022-06-24 NOTE — Progress Notes (Signed)
Established Patient Office Visit  Subjective   Patient ID: Lisa Logan, female    DOB: 1990-08-03  Age: 32 y.o. MRN: [id number]  Chief Complaint  Patient presents with   Anxiety    Medication follow up       Anxiety: Patient was seen by me on 05/13/2022 to establish care.  His start patient on fluoxetine 10 mg for 2 weeks to titrate up to fluoxetine 20 mg thereafter.  Patient is here for follow-up.  She is also referred to psychology to get enrolled in therapy at last juncture. States taht she has not had any side effects from the medication. States that she has started therapy that is expensive. States that she has been 1.5 sessions.   States that she does have passive SI that is increased with her anger. States she is unsure if it is complex PTSD or ADHD States that she has been medicated for it in the past. Starting around the age of 45 (3-4th grade)  States that her sleep has not changed. Stats that she has a difficult time getting to sleep but once a sleep she is able to stay asleep      06/24/2022    9:07 AM 05/13/2022   10:09 AM 04/11/2019   10:57 AM  PHQ9 SCORE ONLY  PHQ-9 Total Score 18 21 11        06/24/2022    9:08 AM 05/13/2022   10:10 AM 08/02/2018    1:18 PM 06/23/2017    9:56 AM  GAD 7 : Generalized Anxiety Score  Nervous, Anxious, on Edge 3 3 2 3   Control/stop worrying 3 3 3 3   Worry too much - different things 3 3 3 3   Trouble relaxing 3 3 3 3   Restless 0 0 2 1  Easily annoyed or irritable 3 3 3 1   Afraid - awful might happen 3 3 3 3   Total GAD 7 Score 18 18 19 17   Anxiety Difficulty  Extremely difficult Very difficult Somewhat difficult        Review of Systems  Constitutional:  Negative for chills and fever.  Respiratory:  Negative for shortness of breath.   Cardiovascular:  Negative for chest pain.  Neurological:  Negative for headaches.  Psychiatric/Behavioral:  Negative for hallucinations and suicidal ideas.       Objective:     BP 108/62    Pulse 68   Temp 97.8 F (36.6 C)   Resp 16   Ht 5\' 2"  (1.575 m)   Wt 185 lb 2 oz (84 kg)   SpO2 100%   BMI 33.86 kg/m    Physical Exam Vitals and nursing note reviewed.  Constitutional:      Appearance: Normal appearance.  Cardiovascular:     Rate and Rhythm: Normal rate and regular rhythm.     Heart sounds: Normal heart sounds.  Pulmonary:     Effort: Pulmonary effort is normal.     Breath sounds: Normal breath sounds.  Neurological:     Mental Status: She is alert.      No results found for any visits on 06/24/22.    The ASCVD Risk score (Arnett DK, et al., 2019) failed to calculate for the following reasons:   The 2019 ASCVD risk score is only valid for ages 22 to 92    Assessment & Plan:   Problem List Items Addressed This Visit       Other   Anxiety    No large change in  symptomology.  Patient currently on fluoxetine 20 mg doing therapy.  Ambulatory referral to psychiatry for further evaluation and treatment      Relevant Medications   traZODone (DESYREL) 50 MG tablet   Other Relevant Orders   Ambulatory referral to Psychiatry   Depression - Primary    Currently on fluoxetine 20 mg.  Patient still has passive SI I reviewed places to be seen emergently emergently if needed.  Also reviewed "988".  Patient currently in therapy.  Will refer to psychiatry      Relevant Medications   traZODone (DESYREL) 50 MG tablet   Other Relevant Orders   Ambulatory referral to Psychiatry   Insomnia    Still having a history of the same.  Will try trazodone 25 to 50 mg nightly.  Patient reach out to me via MyChart if effective or ineffective.  She will do half a tab for 3 to 4 days if ineffective she will go up to 1 tablet      Relevant Medications   traZODone (DESYREL) 50 MG tablet    Return in about 1 year (around 06/24/2023) for CPE and Labs.    Romilda Garret, NP

## 2022-06-24 NOTE — Assessment & Plan Note (Signed)
Still having a history of the same.  Will try trazodone 25 to 50 mg nightly.  Patient reach out to me via MyChart if effective or ineffective.  She will do half a tab for 3 to 4 days if ineffective she will go up to 1 tablet

## 2022-06-24 NOTE — Assessment & Plan Note (Signed)
Currently on fluoxetine 20 mg.  Patient still has passive SI I reviewed places to be seen emergently emergently if needed.  Also reviewed "988".  Patient currently in therapy.  Will refer to psychiatry

## 2022-06-24 NOTE — Assessment & Plan Note (Signed)
No large change in symptomology.  Patient currently on fluoxetine 20 mg doing therapy.  Ambulatory referral to psychiatry for further evaluation and treatment

## 2022-06-24 NOTE — Patient Instructions (Signed)
Nice to see you today Let me know if the sleep medication helps  I have referred you to psychiatry they should reach out to you Follow up with m in 1 year for your physical

## 2022-07-12 ENCOUNTER — Ambulatory Visit (INDEPENDENT_AMBULATORY_CARE_PROVIDER_SITE_OTHER): Payer: 59 | Admitting: Gastroenterology

## 2022-07-12 ENCOUNTER — Encounter: Payer: Self-pay | Admitting: Gastroenterology

## 2022-07-12 VITALS — BP 114/77 | HR 65 | Temp 98.7°F | Ht 62.0 in | Wt 178.6 lb

## 2022-07-12 DIAGNOSIS — K851 Biliary acute pancreatitis without necrosis or infection: Secondary | ICD-10-CM | POA: Diagnosis not present

## 2022-07-12 NOTE — Progress Notes (Signed)
Wyline Mood MD, MRCP(U.K) [address]  [address]  [place], [place] [address]  Main: [phone]  Fax: [phone]   Gastroenterology Consultation  Referring Provider:    Dr. York Cerise Primary Care Physician:  Eden Emms, NP Primary Gastroenterologist:  Dr. Wyline Mood  Reason for Consultation:     Acute pancreatitis  HPI:   Lisa Logan is a 32 y.o. y/o female referred for acute pancreatitis.  She was seen in the emergency room in December 2023 by Dr. York Cerise.  She presented back 10 with pain in the upper part of her abdomen which had started on the same day of her presentation to the emergency room.  She had some wine 4 days prior does not drink alcohol regularly she had been diagnosed with COVID  Ultrasound abdomen right upper quadrant showed that the gallbladder was intact no gallstones were seen no intrahepatic biliary ductal dilatation was noted.  CMP demonstrated an ALT of 120 AST of 94 alkaline phosphatase of 55 and a lipase of 145.  Hemoglobin was 14.8 g.  05/13/2022 LFTs have improved but not resolved her lipase had returned back to normal  She was discharged home.  She states that the pain began on the day of presentation to the emergency room in the epigastric area as well as the right upper quadrant.  Never had such a issue in the past.  No family history of gallstones.  No new medications.  Very occasional drink of alcohol.  She recollects the pain stopped all of a sudden.  Past Medical History:  Diagnosis Date   Abnormal Pap smear of cervix    Anxiety    Arm fracture    C. difficile colitis    Depression    Epilepsy    High grade squamous intraepithelial lesion (HGSIL), grade 3 CIN, on biopsy of cervix    HPV in female    Lactose intolerance    Migraine    Obesity    SOB (shortness of breath)    Vaccine for human papilloma virus (HPV) types 6, 11, 16, and 18 administered    Vitamin D deficiency     Past Surgical History:  Procedure Laterality Date    LEEP  2019   CIN 1-2, unclear margins    Prior to Admission medications   Medication Sig Start Date End Date Taking? Authorizing Provider  augmented betamethasone dipropionate (DIPROLENE-AF) 0.05 % cream Apply topically 2 (two) times daily.    [provider]  FLUoxetine (PROZAC) 20 MG capsule Take 1 capsule (20 mg total) by mouth daily. 06/08/22   Eden Emms, NP  lidocaine (XYLOCAINE) 5 % ointment Apply a pea sized amount topically 20 minutes prior to intercourse, wipe off just prior to intercourse. Patient not taking: Reported on 05/13/2022 10/07/17   Romualdo Bolk, MD  norethindrone-ethinyl estradiol (LOESTRIN) 1-20 MG-MCG tablet Take 1 tablet by mouth daily.    [provider]  traZODone (DESYREL) 50 MG tablet Take 0.5-1 tablets (25-50 mg total) by mouth at bedtime as needed for sleep. 06/24/22   Eden Emms, NP    Family History  Problem Relation Age of Onset   Arthritis Mother    Mental illness Mother    Cancer Mother        skin    Migraines Mother    Depression Mother    High blood pressure Mother    Bipolar disorder Mother    Obesity Mother    Skin cancer Mother  not melanoma   Alcohol abuse Father    Hypertension Father    Mental illness Father    Depression Father    Eating disorder Father    Cancer Maternal Aunt        Bone   Depression Maternal Aunt    Lung cancer Maternal Aunt    Arthritis Maternal Grandmother    Stroke Maternal Grandmother    Mental illness Maternal Grandmother    Skin cancer Maternal Grandmother    Cancer Maternal Grandmother        skin   Cancer Maternal Grandfather        lung   Mental illness Maternal Grandfather    Cancer Paternal Grandmother        Skin   Depression Sister    Depression Brother    Cancer Maternal Uncle        skin   Depression Maternal Uncle      Social History   Tobacco Use   Smoking status: Never   Smokeless tobacco: Never  Vaping Use   Vaping Use: Never used   Substance Use Topics   Alcohol use: Not Currently    Comment: occassional   Drug use: No    Allergies as of 07/12/2022 - Review Complete 06/24/2022  Allergen Reaction Noted   Culturelle [lactobacillus] Rash 08/17/2016   Penicillins Rash 08/07/2014    Review of Systems:    All systems reviewed and negative except where noted in HPI.   Physical Exam:  BP 114/77   Pulse 65   Temp 98.7 F (37.1 C) (Oral)   Ht 5\' 2"  (1.575 m)   Wt 178 lb 9.6 oz (81 kg)   BMI 32.67 kg/m  No LMP recorded. (Menstrual status: Oral contraceptives). Psych:  Alert and cooperative. Normal mood and affect. General:   Alert,  Well-developed, well-nourished, pleasant and cooperative in NAD Head:  Normocephalic and atraumatic. Eyes:  Sclera clear, no icterus.   Conjunctiva pink. Abdomen:  Normal bowel sounds.  No bruits.  Soft, non-tender and non-distended without masses, hepatosplenomegaly or hernias noted.  No guarding or rebound tenderness.    Neurologic:  Alert and oriented x3;  grossly normal neurologically. Psych:  Alert and cooperative. Normal mood and affect.  Imaging Studies: No results found.  Assessment and Plan:   Lisa Logan is a 32 y.o. y/o female has been referred for acute pancreatitis which brought her to the emergency room back in December 2023.  Lipase is elevated over 2 times upper limit of normal.  Liver function test which are completely normal a year prior had a bump in ALT as well as AST.  They have since gone down.  The description of her pain and sudden cessation is in keeping with possible gallstone pancreatitis.  Ultrasound did not show any stones or sludge but it is possible the patient had microlithiasis and I will recheck her CMP to ensure that the transaminases have resolved and refer to Dr. Aleen Campi to discuss the benefits of a cholecystectomy  Follow up in as needed  Dr Wyline Mood MD,MRCP(U.K)

## 2022-07-13 ENCOUNTER — Telehealth: Payer: Self-pay | Admitting: Surgery

## 2022-07-13 LAB — COMPREHENSIVE METABOLIC PANEL
ALT: 88 IU/L — ABNORMAL HIGH (ref 0–32)
AST: 67 IU/L — ABNORMAL HIGH (ref 0–40)
Albumin/Globulin Ratio: 1.9 (ref 1.2–2.2)
Albumin: 4.4 g/dL (ref 4.0–5.0)
Alkaline Phosphatase: 53 IU/L (ref 44–121)
BUN/Creatinine Ratio: 9 (ref 9–23)
BUN: 9 mg/dL (ref 6–20)
Bilirubin Total: 0.2 mg/dL (ref 0.0–1.2)
CO2: 21 mmol/L (ref 20–29)
Calcium: 9.6 mg/dL (ref 8.7–10.2)
Chloride: 103 mmol/L (ref 96–106)
Creatinine, Ser: 0.96 mg/dL (ref 0.57–1.00)
Globulin, Total: 2.3 g/dL (ref 1.5–4.5)
Glucose: 97 mg/dL (ref 70–99)
Potassium: 5.3 mmol/L — ABNORMAL HIGH (ref 3.5–5.2)
Sodium: 139 mmol/L (ref 134–144)
Total Protein: 6.7 g/dL (ref 6.0–8.5)
eGFR: 82 mL/min/{1.73_m2} (ref >59)

## 2022-07-13 NOTE — Telephone Encounter (Signed)
Left message for patient to call the office, referral from Dr. Tobi Bastos for Dr. Aleen Campi to see for evaluation of possible gallstones pancreatitis.

## 2022-07-13 NOTE — Progress Notes (Signed)
Inform LFT's still elevated obtain full viral hepatitis and autoimmune labs , obtain MRI/MRCP to rule out stones in the common bile duct

## 2022-07-14 DIAGNOSIS — R7989 Other specified abnormal findings of blood chemistry: Secondary | ICD-10-CM

## 2022-07-14 NOTE — Addendum Note (Signed)
Addended by: Adela Ports on: 07/14/2022 04:53 PM   Modules accepted: Orders

## 2022-07-16 ENCOUNTER — Encounter: Payer: Self-pay | Admitting: Nurse Practitioner

## 2022-07-16 DIAGNOSIS — F5104 Psychophysiologic insomnia: Secondary | ICD-10-CM

## 2022-07-16 MED ORDER — HYDROXYZINE PAMOATE 25 MG PO CAPS
25.0000 mg | ORAL_CAPSULE | Freq: Every evening | ORAL | 0 refills | Status: DC | PRN
Start: 2022-07-16 — End: 2022-08-02

## 2022-07-20 ENCOUNTER — Ambulatory Visit: Payer: 59

## 2022-07-27 ENCOUNTER — Encounter: Payer: Self-pay | Admitting: Nurse Practitioner

## 2022-07-27 DIAGNOSIS — G4700 Insomnia, unspecified: Secondary | ICD-10-CM

## 2022-07-28 ENCOUNTER — Telehealth: Payer: Self-pay | Admitting: Surgery

## 2022-07-28 NOTE — Telephone Encounter (Signed)
Multiple messages have been left for patient to call our office for scheduling appointment to evaluate for possible gallstone pancreatitis.  To date patient has yet to return any of our calls.

## 2022-08-02 MED ORDER — HYDROXYZINE PAMOATE 50 MG PO CAPS
50.0000 mg | ORAL_CAPSULE | Freq: Every evening | ORAL | 0 refills | Status: DC | PRN
Start: 2022-08-02 — End: 2023-02-21

## 2022-08-16 ENCOUNTER — Telehealth: Payer: Self-pay

## 2022-08-16 NOTE — Telephone Encounter (Signed)
Sent 2 messages via text prior to calling pt to confirm upcoming appointment.  Left message for pt to call and confirm appointment by next day to avoid cancellation of appt.

## 2022-08-19 ENCOUNTER — Encounter: Payer: Self-pay | Admitting: Obstetrics & Gynecology

## 2022-08-19 ENCOUNTER — Ambulatory Visit (INDEPENDENT_AMBULATORY_CARE_PROVIDER_SITE_OTHER): Payer: 59 | Admitting: Obstetrics & Gynecology

## 2022-08-19 VITALS — BP 114/72 | HR 68 | Ht 62.0 in | Wt 174.0 lb

## 2022-08-19 DIAGNOSIS — B977 Papillomavirus as the cause of diseases classified elsewhere: Secondary | ICD-10-CM | POA: Diagnosis not present

## 2022-08-19 DIAGNOSIS — N870 Mild cervical dysplasia: Secondary | ICD-10-CM

## 2022-08-19 DIAGNOSIS — Z23 Encounter for immunization: Secondary | ICD-10-CM | POA: Diagnosis not present

## 2022-08-19 NOTE — Progress Notes (Signed)
   GYNECOLOGY OFFICE VISIT NOTE  History:   Lisa Logan is a 32 y.o. G0P0000 here today for second opinion about management of persistent CIN I since 2019.  Long history of cervical dysplasia, had LEEP for CIN 3 in 2019, unclear margins. Subsequent multiple paps afterwards have been LGSIL with positive HRHPV and colposcopies showing CIN 1. Last pap was done 03/2022 in an office in [place] followed by colposcopy and biopsies (3 biopsied areas) in 05/2022 showed low grade dysplasia.  Never received HPV vaccine series. She denies any abnormal vaginal discharge, bleeding, pelvic pain or other concerns.    Past Medical History:  Diagnosis Date   Abnormal Pap smear of cervix    Anxiety    Arm fracture    C. 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    Lactose intolerance    Migraine    Obesity    SOB (shortness of breath)    Vaccine for human papilloma virus (HPV) types 6, 11, 16, and 18 administered    Vitamin D deficiency     Past Surgical History:  Procedure Laterality Date   LEEP  2019   CIN 1-2, unclear margins    The following portions of the patient's history were reviewed and updated as appropriate: allergies, current medications, past family history, past medical history, past social history, past surgical history and problem list.   Review of Systems:  Pertinent items noted in HPI and remainder of comprehensive ROS otherwise negative.  Physical Exam:  BP 114/72   Pulse 68   Ht 5\' 2"  (1.575 m)   Wt 174 lb (78.9 kg)   BMI 31.83 kg/m  CONSTITUTIONAL: Well-developed, well-nourished female in no acute distress.  HEENT:  Normocephalic, atraumatic. External right and left ear normal. No scleral icterus.  NECK: Normal range of motion, supple, no masses noted on observation SKIN: No rash noted. Not diaphoretic. No erythema. No pallor. MUSCULOSKELETAL: Normal range  of motion. No edema noted. NEUROLOGIC: Alert and oriented to person, place, and time. Normal muscle tone coordination. No cranial nerve deficit noted. PSYCHIATRIC: Normal mood and affect. Normal behavior. Normal judgment and thought content. CARDIOVASCULAR: Normal heart rate noted RESPIRATORY: Effort and breath sounds normal, no problems with respiration noted ABDOMEN: No masses noted. No other overt distention noted.   PELVIC: Deferred     Assessment and Plan:     1. Need for HPV vaccine 2. HPV in female Counseled about HPV vaccination, she agreed to this plan.  Committed to getting full series. First injection given today.  - HPV 9-valent vaccine,Recombinat  3. CIN I (cervical intraepithelial neoplasia I) As per ASCCP guidelines, she needs cotesting in 12 and 24 months; next pap due in 03/2023. If still persistent, may have to reconsider another treatment modality (cryotherapy vs LEEP).   Routine preventative health maintenance measures emphasized. Please refer to After Visit Summary for other counseling recommendations.   Return in about 2 months (around 10/19/2022) for Gardasil #2  6 months from now: Gardasil #3.    I spent  40  minutes dedicated to the care of this patient including pre-visit review of records, face to face time with the patient discussing her conditions and treatments and post visit orders.    Jaynie Collins, MD, FACOG Obstetrician & Gynecologist, [facility] for [employer], [facility]

## 2022-08-27 ENCOUNTER — Other Ambulatory Visit: Payer: Self-pay | Admitting: Nurse Practitioner

## 2022-08-27 DIAGNOSIS — F419 Anxiety disorder, unspecified: Secondary | ICD-10-CM

## 2022-09-14 ENCOUNTER — Encounter: Payer: Self-pay | Admitting: Nurse Practitioner

## 2022-09-14 ENCOUNTER — Ambulatory Visit (INDEPENDENT_AMBULATORY_CARE_PROVIDER_SITE_OTHER): Payer: 59 | Admitting: Nurse Practitioner

## 2022-09-14 ENCOUNTER — Telehealth: Payer: Self-pay | Admitting: Psychiatry

## 2022-09-14 VITALS — BP 94/62 | HR 90 | Temp 97.9°F | Resp 16 | Ht 62.0 in | Wt 173.2 lb

## 2022-09-14 DIAGNOSIS — F322 Major depressive disorder, single episode, severe without psychotic features: Secondary | ICD-10-CM

## 2022-09-14 DIAGNOSIS — G4700 Insomnia, unspecified: Secondary | ICD-10-CM

## 2022-09-14 NOTE — Telephone Encounter (Signed)
Disregard message

## 2022-09-14 NOTE — Assessment & Plan Note (Signed)
History of the same referral to psychiatry which end up canceling the appointment on her.  Referral to different psychiatrist patient denies HI SI or AVH actively.  Continue fluoxetine

## 2022-09-14 NOTE — Assessment & Plan Note (Signed)
Patient currently maintained on hydroxyzine 50 mg nightly as needed.  States is not as effective as it once was.  We did discuss about moving her dosing time earlier in the evening to see if it helps her get to sleep more on regular basis.  Ambulatory referral to new psychiatrist

## 2022-09-14 NOTE — Progress Notes (Signed)
   Acute Office Visit  Subjective:     Patient ID: Lisa Logan, female    DOB: Aug 01, 1990, 32 y.o.   MRN: [id number]  Chief Complaint  Patient presents with   Referral    HPI Patient is in today for referral.  Patient was seen by me on 06/24/2022 for MDD, anxiety, and insomnia. She was written trazodone and a referred to pyschairty.  States that she did make an appointment and sent off the new patient appt. States that she was called and canceled. And scheduled her out of r2 months. States that the clinic [facility]   [place] attention specialist she has tried and they have cancelled on her also   States that she did have a good reaction and for approx a week she was able to go to sleep with the hydroxyzine.  States that she feels the medication is less effective.  States she can take it and still be awake for 3 hours prior to going to sleep.  Per her report she is practicing good sleep hygiene   Review of Systems  Constitutional:  Negative for chills and fever.  Respiratory:  Negative for shortness of breath.   Cardiovascular:  Negative for chest pain.  Psychiatric/Behavioral:  Negative for hallucinations and suicidal ideas. The patient has insomnia.         Objective:    BP 94/62   Pulse 90   Temp 97.9 F (36.6 C)   Resp 16   Ht 5\' 2"  (1.575 m)   Wt 173 lb 4 oz (78.6 kg)   SpO2 98%   BMI 31.69 kg/m    Physical Exam Vitals and nursing note reviewed.  Constitutional:      Appearance: Normal appearance.  Cardiovascular:     Rate and Rhythm: Normal rate and regular rhythm.     Heart sounds: Normal heart sounds.  Pulmonary:     Effort: Pulmonary effort is normal.     Breath sounds: Normal breath sounds.  Neurological:     Mental Status: She is alert.     No results found for any visits on 09/14/22.      Assessment & Plan:   Problem List Items Addressed This Visit       Other   Depression - Primary    History of the same referral to  psychiatry which end up canceling the appointment on her.  Referral to different psychiatrist patient denies HI SI or AVH actively.  Continue fluoxetine      Relevant Orders   Ambulatory referral to Psychiatry   Insomnia    Patient currently maintained on hydroxyzine 50 mg nightly as needed.  States is not as effective as it once was.  We did discuss about moving her dosing time earlier in the evening to see if it helps her get to sleep more on regular basis.  Ambulatory referral to new psychiatrist       No orders of the defined types were placed in this encounter.   Return in about 8 months (around 05/17/2023) for CPE and Labs.  Audria Nine, NP

## 2022-09-16 ENCOUNTER — Encounter: Payer: Self-pay | Admitting: *Deleted

## 2022-09-23 ENCOUNTER — Ambulatory Visit: Payer: 59 | Admitting: Psychiatry

## 2022-09-28 ENCOUNTER — Encounter: Payer: Self-pay | Admitting: Nurse Practitioner

## 2022-09-28 DIAGNOSIS — F419 Anxiety disorder, unspecified: Secondary | ICD-10-CM

## 2022-10-01 MED ORDER — BUSPIRONE HCL 5 MG PO TABS: 5.0000 mg | ORAL_TABLET | Freq: Two times a day (BID) | ORAL | 0 refills | Status: DC

## 2022-10-19 ENCOUNTER — Ambulatory Visit (INDEPENDENT_AMBULATORY_CARE_PROVIDER_SITE_OTHER): Payer: 59 | Admitting: Obstetrics & Gynecology

## 2022-10-19 VITALS — BP 116/74 | HR 69 | Wt 172.0 lb

## 2022-10-19 DIAGNOSIS — Z23 Encounter for immunization: Secondary | ICD-10-CM | POA: Diagnosis not present

## 2022-10-19 NOTE — Progress Notes (Signed)
Lisa Logan is here for their 2nd Gardasil injection. Pt denies any issues at this time. Pt tolerated injection well. To follow up in 4 months for next injection.
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
## 2022-11-15 ENCOUNTER — Ambulatory Visit (INDEPENDENT_AMBULATORY_CARE_PROVIDER_SITE_OTHER): Payer: 59 | Admitting: Psychiatry

## 2022-11-15 ENCOUNTER — Telehealth: Payer: Self-pay | Admitting: Nurse Practitioner

## 2022-11-15 ENCOUNTER — Encounter: Payer: Self-pay | Admitting: Psychiatry

## 2022-11-15 ENCOUNTER — Encounter: Payer: Self-pay | Admitting: Nurse Practitioner

## 2022-11-15 VITALS — BP 110/75 | HR 75 | Temp 97.9°F | Ht 62.0 in | Wt 178.8 lb

## 2022-11-15 DIAGNOSIS — F331 Major depressive disorder, recurrent, moderate: Secondary | ICD-10-CM

## 2022-11-15 DIAGNOSIS — F4310 Post-traumatic stress disorder, unspecified: Secondary | ICD-10-CM | POA: Diagnosis not present

## 2022-11-15 DIAGNOSIS — R41840 Attention and concentration deficit: Secondary | ICD-10-CM

## 2022-11-15 DIAGNOSIS — Z79899 Other long term (current) drug therapy: Secondary | ICD-10-CM

## 2022-11-15 NOTE — Telephone Encounter (Signed)
Can we touch base with the patient and see if she has any one in mind she would like to see

## 2022-11-15 NOTE — Patient Instructions (Signed)
Increase fluoxetine 40 mg daily  Continue buspar 5 mg twice a day  Next appointment: 10/3 at 3 pm

## 2022-11-15 NOTE — Addendum Note (Signed)
Addended by: Neysa Hotter on: 11/15/2022 12:59 PM   Modules accepted: Level of Service

## 2022-11-15 NOTE — Telephone Encounter (Signed)
Left message to return call to our office.  

## 2022-11-15 NOTE — Telephone Encounter (Signed)
Patient called in stating that she went to her appointment to see psychiatrist Regan Lemming today,and he did not help her at all.She is requesting a  phone call to see if she can get a referral sent out to see someone else.

## 2022-11-18 NOTE — Telephone Encounter (Signed)
Left voicemail for patient to call the office back.   

## 2022-11-19 ENCOUNTER — Other Ambulatory Visit: Payer: Self-pay | Admitting: Nurse Practitioner

## 2022-11-19 DIAGNOSIS — F419 Anxiety disorder, unspecified: Secondary | ICD-10-CM

## 2022-11-19 NOTE — Telephone Encounter (Signed)
Left voicemail for patient to call the office back.   

## 2022-11-22 MED ORDER — BUSPIRONE HCL 5 MG PO TABS: 5.0000 mg | ORAL_TABLET | Freq: Two times a day (BID) | ORAL | 1 refills | Status: DC

## 2022-11-22 NOTE — Telephone Encounter (Signed)
Have called patient. She is working on getting set up with office her sister goes to. She will reach out to our office if anything needed from our office.

## 2022-11-23 MED ORDER — FLUOXETINE HCL 40 MG PO CAPS: 40.0000 mg | ORAL_CAPSULE | Freq: Every day | ORAL | 0 refills | Status: DC

## 2022-11-23 NOTE — Addendum Note (Signed)
Addended by: Neysa Hotter on: 11/23/2022 05:14 PM   Modules accepted: Orders

## 2022-11-23 NOTE — Telephone Encounter (Signed)
Pt had two different referral, once placed several months ago for [facility] and another scheduled a few weeks ago to Dr Tomasa Hose in [place].   The patient got scheduled with [facility] for 11/15/2022 and decided to keep her appt with them and not schedule with Dr Tomasa Hose. She said that she would let us know if it does not work out with [facility] and needs the referral to Dr Tomasa Hose.   I have closed the most recent referral to Dr Tomasa Hose (Psychiatry)

## 2022-11-24 NOTE — Telephone Encounter (Signed)
Noted. I am ok with that plan

## 2022-12-16 ENCOUNTER — Encounter: Payer: Self-pay | Admitting: Nurse Practitioner

## 2022-12-24 LAB — URINE DRUG PANEL 7
Amphetamines, Urine: NEGATIVE ng/mL
Barbiturate Quant, Ur: NEGATIVE ng/mL
Benzodiazepine Quant, Ur: NEGATIVE ng/mL
Cannabinoid Quant, Ur: NEGATIVE ng/mL
Cocaine (Metab.): NEGATIVE ng/mL
Opiate Quant, Ur: NEGATIVE ng/mL
PCP Quant, Ur: NEGATIVE ng/mL
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
## 2022-12-25 ENCOUNTER — Encounter: Payer: Self-pay | Admitting: Psychiatry

## 2022-12-30 ENCOUNTER — Encounter: Payer: Self-pay | Admitting: Psychiatry

## 2022-12-30 ENCOUNTER — Ambulatory Visit (INDEPENDENT_AMBULATORY_CARE_PROVIDER_SITE_OTHER): Payer: 59 | Admitting: Psychiatry

## 2022-12-30 VITALS — BP 106/73 | HR 74 | Temp 98.2°F | Ht 62.0 in | Wt 169.6 lb

## 2022-12-30 DIAGNOSIS — F909 Attention-deficit hyperactivity disorder, unspecified type: Secondary | ICD-10-CM | POA: Diagnosis not present

## 2022-12-30 DIAGNOSIS — F4310 Post-traumatic stress disorder, unspecified: Secondary | ICD-10-CM

## 2022-12-30 DIAGNOSIS — F331 Major depressive disorder, recurrent, moderate: Secondary | ICD-10-CM | POA: Diagnosis not present

## 2022-12-30 MED ORDER — BUSPIRONE HCL 5 MG PO TABS: 5.0000 mg | ORAL_TABLET | Freq: Two times a day (BID) | ORAL | 0 refills | Status: DC

## 2022-12-30 MED ORDER — METHYLPHENIDATE HCL ER (OSM) 18 MG PO TBCR: 18.0000 mg | EXTENDED_RELEASE_TABLET | Freq: Every day | ORAL | 0 refills | Status: DC

## 2022-12-30 NOTE — Patient Instructions (Signed)
Increase fluoxetine 60 mg daily  Continue Buspar 5 mg twice a day  Start concerta 18 mg daily  Next appointment: 11/25 at 11:30

## 2022-12-31 ENCOUNTER — Other Ambulatory Visit: Payer: Self-pay | Admitting: Psychiatry

## 2022-12-31 DIAGNOSIS — F419 Anxiety disorder, unspecified: Secondary | ICD-10-CM

## 2022-12-31 MED ORDER — FLUOXETINE HCL 20 MG PO CAPS
20.0000 mg | ORAL_CAPSULE | Freq: Every day | ORAL | 0 refills | Status: DC
Start: 2022-12-31 — End: 2023-02-21

## 2023-01-04 ENCOUNTER — Encounter: Payer: Self-pay | Admitting: Obstetrics & Gynecology

## 2023-01-05 ENCOUNTER — Other Ambulatory Visit: Payer: Self-pay | Admitting: *Deleted

## 2023-01-05 MED ORDER — NORETHINDRONE ACET-ETHINYL EST 1-20 MG-MCG PO TABS: 1.0000 | ORAL_TABLET | Freq: Every day | ORAL | 10 refills | Status: DC

## 2023-01-14 ENCOUNTER — Encounter (INDEPENDENT_AMBULATORY_CARE_PROVIDER_SITE_OTHER): Payer: 59

## 2023-01-14 DIAGNOSIS — F909 Attention-deficit hyperactivity disorder, unspecified type: Secondary | ICD-10-CM

## 2023-01-17 NOTE — Telephone Encounter (Signed)
I've spent a total time of 7 minutes providing service to this patient-generated inquiry in the MyChart message  

## 2023-01-24 ENCOUNTER — Other Ambulatory Visit: Payer: Self-pay | Admitting: Psychiatry

## 2023-01-24 DIAGNOSIS — F909 Attention-deficit hyperactivity disorder, unspecified type: Secondary | ICD-10-CM | POA: Diagnosis not present

## 2023-01-24 MED ORDER — LISDEXAMFETAMINE DIMESYLATE 20 MG PO CAPS: 20.0000 mg | ORAL_CAPSULE | Freq: Every day | ORAL | 0 refills | Status: DC

## 2023-01-24 NOTE — Telephone Encounter (Signed)
I've spent total of 13 minutes providing service to this patient-generated inquiry in the MyChart message

## 2023-01-26 MED ORDER — AMPHETAMINE-DEXTROAMPHET ER 10 MG PO CP24: 10.0000 mg | ORAL_CAPSULE | Freq: Every day | ORAL | 0 refills | Status: DC

## 2023-01-26 NOTE — Addendum Note (Signed)
Addended by: Neysa Hotter on: 01/26/2023 05:55 PM   Modules accepted: Orders

## 2023-02-16 ENCOUNTER — Telehealth: Payer: Self-pay | Admitting: Obstetrics and Gynecology

## 2023-02-16 NOTE — Telephone Encounter (Signed)
Lm for patient to call office back to schedule annual appt with [facility] after 03/10/23
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
## 2023-02-21 ENCOUNTER — Encounter: Payer: Self-pay | Admitting: Psychiatry

## 2023-02-21 ENCOUNTER — Ambulatory Visit (INDEPENDENT_AMBULATORY_CARE_PROVIDER_SITE_OTHER): Payer: 59

## 2023-02-21 ENCOUNTER — Ambulatory Visit (INDEPENDENT_AMBULATORY_CARE_PROVIDER_SITE_OTHER): Payer: 59 | Admitting: Psychiatry

## 2023-02-21 VITALS — BP 117/77 | HR 77

## 2023-02-21 VITALS — BP 118/76 | HR 91 | Temp 98.8°F | Ht 62.0 in | Wt 162.2 lb

## 2023-02-21 DIAGNOSIS — F331 Major depressive disorder, recurrent, moderate: Secondary | ICD-10-CM

## 2023-02-21 DIAGNOSIS — F4310 Post-traumatic stress disorder, unspecified: Secondary | ICD-10-CM

## 2023-02-21 DIAGNOSIS — Z23 Encounter for immunization: Secondary | ICD-10-CM

## 2023-02-21 DIAGNOSIS — F909 Attention-deficit hyperactivity disorder, unspecified type: Secondary | ICD-10-CM | POA: Diagnosis not present

## 2023-02-21 DIAGNOSIS — F411 Generalized anxiety disorder: Secondary | ICD-10-CM | POA: Diagnosis not present

## 2023-02-21 MED ORDER — FLUOXETINE HCL 40 MG PO CAPS: 40.0000 mg | ORAL_CAPSULE | Freq: Every day | ORAL | 0 refills | Status: DC

## 2023-02-21 MED ORDER — AMPHETAMINE-DEXTROAMPHET ER 20 MG PO CP24: 20.0000 mg | ORAL_CAPSULE | ORAL | 0 refills | Status: DC

## 2023-02-21 MED ORDER — FLUOXETINE HCL 20 MG PO CAPS: 20.0000 mg | ORAL_CAPSULE | Freq: Every day | ORAL | 0 refills | Status: DC

## 2023-02-21 NOTE — Progress Notes (Signed)
Lisa Logan is here for their 3rd Gardasil injection. Pt denies any issues at this time. Pt tolerated injection well. To follow up PRN.

## 2023-02-21 NOTE — Patient Instructions (Signed)
Continue fluoxetine 60 mg daily  Continue Buspar 5 mg twice a day  Increase Adderall XR 20 mg daily Next appointment: 1/16 at 1:30

## 2023-03-29 ENCOUNTER — Other Ambulatory Visit: Payer: Self-pay | Admitting: Psychiatry

## 2023-03-30 LAB — TSH: TSH: 3.11 u[IU]/mL (ref 0.450–4.500)

## 2023-03-31 ENCOUNTER — Encounter: Payer: Self-pay | Admitting: Psychiatry

## 2023-04-04 ENCOUNTER — Other Ambulatory Visit: Payer: Self-pay | Admitting: Psychiatry

## 2023-04-04 MED ORDER — AMPHETAMINE-DEXTROAMPHET ER 20 MG PO CP24: 20.0000 mg | ORAL_CAPSULE | ORAL | 0 refills | Status: DC

## 2023-04-04 NOTE — Telephone Encounter (Signed)
 Called [facility] as instructed by provider to cancel the Adderall XR spoke to [facility] she stated that she would take care of cancelling the medication

## 2023-04-06 ENCOUNTER — Encounter: Payer: Self-pay | Admitting: Obstetrics & Gynecology

## 2023-04-06 DIAGNOSIS — Z3041 Encounter for surveillance of contraceptive pills: Secondary | ICD-10-CM

## 2023-04-06 NOTE — Telephone Encounter (Signed)
 Insurance updated.

## 2023-04-07 MED ORDER — NORGESTIMATE-ETH ESTRADIOL 0.25-35 MG-MCG PO TABS: 1.0000 | ORAL_TABLET | Freq: Every day | ORAL | 11 refills | Status: DC
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
## 2023-04-14 ENCOUNTER — Other Ambulatory Visit (HOSPITAL_COMMUNITY)
Admission: RE | Admit: 2023-04-14 | Discharge: 2023-04-14 | Disposition: A | Discharge status: Home / Self Care | Payer: BC Managed Care – PPO | Source: Ambulatory Visit | Attending: Obstetrics & Gynecology | Admitting: Obstetrics & Gynecology

## 2023-04-14 ENCOUNTER — Ambulatory Visit (INDEPENDENT_AMBULATORY_CARE_PROVIDER_SITE_OTHER): Payer: BC Managed Care – PPO | Admitting: Psychiatry

## 2023-04-14 ENCOUNTER — Encounter: Payer: Self-pay | Admitting: Psychiatry

## 2023-04-14 ENCOUNTER — Encounter: Payer: Self-pay | Admitting: Obstetrics & Gynecology

## 2023-04-14 ENCOUNTER — Ambulatory Visit (INDEPENDENT_AMBULATORY_CARE_PROVIDER_SITE_OTHER): Payer: BC Managed Care – PPO | Admitting: Obstetrics & Gynecology

## 2023-04-14 VITALS — BP 116/78 | HR 73 | Ht 62.0 in | Wt 166.0 lb

## 2023-04-14 VITALS — BP 114/70 | HR 74 | Temp 97.1°F | Ht 62.0 in | Wt 169.2 lb

## 2023-04-14 DIAGNOSIS — F4310 Post-traumatic stress disorder, unspecified: Secondary | ICD-10-CM | POA: Diagnosis not present

## 2023-04-14 DIAGNOSIS — F411 Generalized anxiety disorder: Secondary | ICD-10-CM | POA: Diagnosis not present

## 2023-04-14 DIAGNOSIS — F909 Attention-deficit hyperactivity disorder, unspecified type: Secondary | ICD-10-CM | POA: Diagnosis not present

## 2023-04-14 DIAGNOSIS — F331 Major depressive disorder, recurrent, moderate: Secondary | ICD-10-CM

## 2023-04-14 DIAGNOSIS — R87612 Low grade squamous intraepithelial lesion on cytologic smear of cervix (LGSIL): Secondary | ICD-10-CM | POA: Insufficient documentation

## 2023-04-14 DIAGNOSIS — Z1339 Encounter for screening examination for other mental health and behavioral disorders: Secondary | ICD-10-CM | POA: Diagnosis not present

## 2023-04-14 DIAGNOSIS — Z01419 Encounter for gynecological examination (general) (routine) without abnormal findings: Secondary | ICD-10-CM | POA: Diagnosis not present

## 2023-04-14 MED ORDER — FLUOXETINE HCL 40 MG PO CAPS: 40.0000 mg | ORAL_CAPSULE | Freq: Every day | ORAL | 0 refills | Status: DC

## 2023-04-14 MED ORDER — BUSPIRONE HCL 5 MG PO TABS: 5.0000 mg | ORAL_TABLET | Freq: Two times a day (BID) | ORAL | 0 refills | Status: DC

## 2023-04-14 MED ORDER — AMPHETAMINE-DEXTROAMPHET ER 30 MG PO CP24: 30.0000 mg | ORAL_CAPSULE | ORAL | 0 refills | Status: DC

## 2023-04-14 NOTE — Progress Notes (Signed)
GYNECOLOGY ANNUAL PREVENTATIVE CARE ENCOUNTER NOTE  History:     Lisa Logan is a 33 y.o. G0P0000 female here for a routine annual gynecologic exam.  Current complaints: worried about persistent abnormal paps for several years.   Denies abnormal vaginal bleeding, discharge, pelvic pain, problems with intercourse or other gynecologic concerns.    Gynecologic History No LMP recorded. (Menstrual status: Oral contraceptives). Contraception: OCP (estrogen/progesterone) Last Pap: 03/09/2022. Result was abnormal with LGSIL and positive HPV. History of  LEEP in 2019, showed CIN 2 with unclear margins. LGSIL/CIN since then.  Completed Gardasil series in 2024.  Obstetric History OB History  Gravida Para Term Preterm AB Living  0 0 0 0 0 0  SAB IAB Ectopic Multiple Live Births  0 0 0 0 0    Past Medical History:  Diagnosis Date   Abnormal Pap smear of cervix    Anxiety    Arm fracture    C. 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 30 capsule 0   augmented betamethasone dipropionate (DIPROLENE-AF) 0.05 % cream Apply topically 2 (two) times daily.     busPIRone (BUSPAR) 5 MG tablet Take 1 tablet (5 mg total) by mouth 2 (two) times daily. 180 tablet 0   FLUoxetine (PROZAC) 20 MG capsule Take 1 capsule (20 mg total) by mouth daily. Take total of 60 mg daily. Take along with 40 mg cap 90 capsule  0   FLUoxetine (PROZAC) 40 MG capsule Take 1 capsule (40 mg total) by mouth daily. Take total of 60 mg daily. Take along with 20 mg cap 90 capsule 0   lidocaine (XYLOCAINE) 5 % ointment Apply a pea sized amount topically 20 minutes prior to intercourse, wipe off just prior to intercourse. 30 g 0   norgestimate-ethinyl estradiol (ORTHO-CYCLEN) 0.25-35 MG-MCG tablet Take 1 tablet by mouth daily. 28 tablet 11   sulfamethoxazole-trimethoprim (BACTRIM DS) 800-160 MG tablet Take 1 tablet by mouth 2 (two) times daily. (Patient not taking: Reported on 04/14/2023)     No current facility-administered medications on file prior to visit.    Allergies  Allergen Reactions   Culturelle [Lactobacillus] Rash   Penicillins Rash    Social History:  reports that she has never smoked. She has never used smokeless tobacco. She reports that she does not currently use alcohol. She reports that she does not use drugs.  Family History  Problem Relation Age of Onset   Anxiety disorder Mother    Arthritis Mother    Mental illness Mother    Cancer Mother        skin    Migraines Mother    Depression Mother    High blood pressure Mother    Bipolar disorder Mother    Obesity Mother  Skin cancer Mother        not melanoma   Hypertension Father    Mental illness Father    Depression Father    Eating disorder Father    ADD / ADHD Sister    Depression Sister    ADD / ADHD Brother    Depression Brother    Cancer Maternal Aunt        Bone   Depression Maternal Aunt    Lung cancer Maternal Aunt    Cancer Maternal Uncle        skin   Depression Maternal Uncle    Cancer Maternal Grandfather        lung   Mental illness Maternal Grandfather    Arthritis Maternal Grandmother    Stroke Maternal Grandmother    Mental illness Maternal Grandmother    Skin cancer Maternal Grandmother    Cancer Maternal Grandmother        skin   Cancer Paternal Grandmother        Skin    The following portions of the  patient's history were reviewed and updated as appropriate: allergies, current medications, past family history, past medical history, past social history, past surgical history and problem list.  Review of Systems Pertinent items noted in HPI and remainder of comprehensive ROS otherwise negative.  Physical Exam:  BP 116/78   Pulse 73   Ht 5\' 2"  (1.575 m)   Wt 166 lb (75.3 kg)   BMI 30.36 kg/m  CONSTITUTIONAL: Well-developed, well-nourished female in no acute distress.  HENT:  Normocephalic, atraumatic, External right and left ear normal.  EYES: Conjunctivae and EOM are normal. Pupils are equal, round, and reactive to light. No scleral icterus.  NECK: Normal range of motion, supple, no masses.  Normal thyroid.  SKIN: Skin is warm and dry. No rash noted. Not diaphoretic. No erythema. No pallor. MUSCULOSKELETAL: Normal range of motion. No tenderness.  No cyanosis, clubbing, or edema. NEUROLOGIC: Alert and oriented to person, place, and time. Normal reflexes, muscle tone coordination.  PSYCHIATRIC: Normal mood and affect. Normal behavior. Normal judgment and thought content. CARDIOVASCULAR: Normal heart rate noted, regular rhythm RESPIRATORY: Clear to auscultation bilaterally. Effort and breath sounds normal, no problems with respiration noted. BREASTS: Symmetric in size. No masses, tenderness, skin changes, nipple drainage, or lymphadenopathy bilaterally. Performed in the presence of a chaperone. ABDOMEN: Soft, no distention noted.  No tenderness, rebound or guarding.  PELVIC: Normal appearing external genitalia and urethral meatus; normal appearing vaginal mucosa and cervix.  No abnormal vaginal discharge noted.  Pap smear obtained.  Normal uterine size, no other palpable masses, no uterine or adnexal tenderness.  Performed in the presence of a chaperone.   Assessment and Plan:     1. 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 Discussed that cryotherapy can be done if desired, however, there is no cure for HPV that seems to be always present. She is s/p HPV vaccine series, completed 07/2022.  Had a long discussion about need for continued close surveillance.  Will follow up cotesting results today and manage accordingly. - Cytology - PAP  2. Well woman exam with routine gynecological exam (Primary) - Cytology - PAP Will follow up results of pap smear and manage accordingly. Normal breast examination today, she was advised to perform periodic self breast examinations.  Routine preventative health maintenance measures  emphasized. Please refer to After Visit Summary for other counseling recommendations.      Jaynie Collins, MD, FACOG Obstetrician & Gynecologist, [facility] for [employer], [facility]

## 2023-04-19 ENCOUNTER — Telehealth: Payer: Self-pay | Admitting: *Deleted

## 2023-04-19 LAB — CYTOLOGY - PAP
Comment: NEGATIVE
Comment: NEGATIVE
Comment: NEGATIVE
Diagnosis: UNDETERMINED — AB
HPV 16: NEGATIVE
HPV 18 / 45: NEGATIVE
High risk HPV: POSITIVE — AB

## 2023-04-19 NOTE — Progress Notes (Signed)
Please schedule patient for colposcopy for ASCUS +HRHPV pap done on 04/14/2023. Please call to inform patient of results and need for appointment.    Jaynie Collins, MD

## 2023-04-19 NOTE — Telephone Encounter (Signed)
Attempted to call pt to go over pap results. Left message for pt to call back.

## 2023-04-20 ENCOUNTER — Other Ambulatory Visit: Payer: Self-pay | Admitting: Psychiatry

## 2023-04-21 ENCOUNTER — Telehealth: Payer: Self-pay | Admitting: *Deleted

## 2023-04-21 DIAGNOSIS — F419 Anxiety disorder, unspecified: Secondary | ICD-10-CM

## 2023-04-21 NOTE — Telephone Encounter (Signed)
Called pt back to go over results and if she had any questions. Pt concerned about the pain, spoke with Dr A and she will send in meds to relax pt and discussed cervical block. Advised also to take ibuprofen and tylenol an hour prior to appt as well.

## 2023-04-22 MED ORDER — LORAZEPAM 0.5 MG PO TABS: 0.5000 mg | ORAL_TABLET | Freq: Three times a day (TID) | ORAL | 0 refills | Status: DC | PRN

## 2023-04-22 NOTE — Addendum Note (Signed)
Addended by: Jaynie Collins A on: 04/22/2023 10:52 PM   Modules accepted: Orders

## 2023-04-22 NOTE — Telephone Encounter (Signed)
Ativan prescribed to be used before procedure.  Recommend taking Tylenol 1000 mg and Ibuprofen 800 mg an hour prior to appointment.  Also please have someone to drive you to and from the appointment.   Jaynie Collins, MD

## 2023-05-04 DIAGNOSIS — F4323 Adjustment disorder with mixed anxiety and depressed mood: Secondary | ICD-10-CM | POA: Diagnosis not present

## 2023-05-09 ENCOUNTER — Encounter: Payer: Self-pay | Admitting: Obstetrics & Gynecology

## 2023-05-09 ENCOUNTER — Ambulatory Visit: Payer: BC Managed Care – PPO | Admitting: Obstetrics & Gynecology

## 2023-05-09 ENCOUNTER — Other Ambulatory Visit (HOSPITAL_COMMUNITY)
Admission: RE | Admit: 2023-05-09 | Discharge: 2023-05-09 | Disposition: A | Discharge status: Home / Self Care | Payer: BC Managed Care – PPO | Source: Ambulatory Visit | Attending: Obstetrics & Gynecology | Admitting: Obstetrics & Gynecology

## 2023-05-09 VITALS — BP 114/65 | HR 87 | Wt 165.0 lb

## 2023-05-09 DIAGNOSIS — R87610 Atypical squamous cells of undetermined significance on cytologic smear of cervix (ASC-US): Secondary | ICD-10-CM | POA: Diagnosis not present

## 2023-05-09 DIAGNOSIS — Z3202 Encounter for pregnancy test, result negative: Secondary | ICD-10-CM

## 2023-05-09 DIAGNOSIS — R87810 Cervical high risk human papillomavirus (HPV) DNA test positive: Secondary | ICD-10-CM | POA: Diagnosis not present

## 2023-05-09 DIAGNOSIS — N870 Mild cervical dysplasia: Secondary | ICD-10-CM | POA: Diagnosis not present

## 2023-05-09 LAB — POCT URINE PREGNANCY: Preg Test, Ur: NEGATIVE

## 2023-05-09 NOTE — Progress Notes (Signed)
    GYNECOLOGY OFFICE COLPOSCOPY PROCEDURE NOTE  33 y.o. G0P0000 here for colposcopy for ASCUS with POSITIVE high risk HPV pap smear on 04/14/2023. History of persistent cervical dysplasia, had LEEP for CIN 2 in 2019 with unclear margins. 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 Discussed role for HPV in cervical dysplasia, need for surveillance. Discussed that cryotherapy or repeat LEEP can be done if desired, however, there is no cure for HPV that seems to be always present. She is s/p HPV vaccine series, completed 07/2022.   Patient gave informed written consent, time out was performed.  Placed in lithotomy position. Cervix viewed with speculum and colposcope after application of acetic acid.   Colposcopy adequate? Yes Acetowhite lesion noted at 7 o'clock; corresponding biopsy obtained.  ECC specimen obtained. All specimens were labeled and sent to pathology.  Chaperone was present during entire procedure.  Patient was given post procedure instructions.  Will follow up pathology and manage accordingly; patient will be contacted with results and recommendations.  Routine preventative health maintenance measures emphasized.    Lenoard Rad, MD, FACOG Obstetrician & Gynecologist, [facility] for [employer], [facility]

## 2023-05-09 NOTE — Patient Instructions (Signed)
 COLPOSCOPY POST-PROCEDURE INSTRUCTIONS  You may take Ibuprofen, Aleve or Tylenol for cramping if needed.  If Monsel's solution was used, you will have a black discharge.  Light bleeding is normal.  If bleeding is heavier than your period, please call.  Put nothing in your vagina until the bleeding or discharge stops (usually 2 or 3 days).  We will call you within one week with biopsy results or discuss the results at your follow-up appointment if needed.

## 2023-05-10 ENCOUNTER — Encounter: Payer: Self-pay | Admitting: Obstetrics & Gynecology

## 2023-05-10 LAB — SURGICAL PATHOLOGY

## 2023-05-19 ENCOUNTER — Ambulatory Visit: Payer: Self-pay | Admitting: Professional Counselor
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
## 2023-05-26 ENCOUNTER — Encounter: Payer: Self-pay | Admitting: Obstetrics & Gynecology

## 2023-05-26 ENCOUNTER — Encounter: Payer: Self-pay | Admitting: Psychiatry

## 2023-05-26 ENCOUNTER — Other Ambulatory Visit: Payer: Self-pay | Admitting: *Deleted

## 2023-05-26 ENCOUNTER — Ambulatory Visit: Payer: BC Managed Care – PPO | Admitting: Psychiatry

## 2023-05-26 VITALS — BP 122/82 | HR 75 | Temp 98.0°F | Ht 62.0 in | Wt 173.8 lb

## 2023-05-26 DIAGNOSIS — F3341 Major depressive disorder, recurrent, in partial remission: Secondary | ICD-10-CM

## 2023-05-26 DIAGNOSIS — F411 Generalized anxiety disorder: Secondary | ICD-10-CM

## 2023-05-26 DIAGNOSIS — F4310 Post-traumatic stress disorder, unspecified: Secondary | ICD-10-CM | POA: Diagnosis not present

## 2023-05-26 DIAGNOSIS — F909 Attention-deficit hyperactivity disorder, unspecified type: Secondary | ICD-10-CM

## 2023-05-26 DIAGNOSIS — Z3041 Encounter for surveillance of contraceptive pills: Secondary | ICD-10-CM

## 2023-05-26 MED ORDER — NORGESTIMATE-ETH ESTRADIOL 0.25-35 MG-MCG PO TABS: 1.0000 | ORAL_TABLET | Freq: Every day | ORAL | 11 refills | Status: DC

## 2023-05-26 MED ORDER — AMPHETAMINE-DEXTROAMPHET ER 30 MG PO CP24: 30.0000 mg | ORAL_CAPSULE | ORAL | 0 refills | Status: DC

## 2023-05-26 NOTE — Patient Instructions (Addendum)
 Continue fluoxetine 60 mg daily  Continue Buspar 5 mg twice a day  Continue Adderall XR 30 mg daily Consider melatonin 6 mg at night, 2 hours before going to bed Next appointment: 4/22 at 4 pm

## 2023-06-06 ENCOUNTER — Telehealth: Payer: Self-pay

## 2023-06-06 DIAGNOSIS — F4310 Post-traumatic stress disorder, unspecified: Secondary | ICD-10-CM

## 2023-06-06 MED ORDER — FLUOXETINE HCL 20 MG PO CAPS: 20.0000 mg | ORAL_CAPSULE | Freq: Every day | ORAL | 0 refills | Status: DC

## 2023-06-06 NOTE — Telephone Encounter (Signed)
 received fax requesting a refill on the fluoxetine 20mg . pt last seen on 05-26-23 next appt4-22-25  [facility]

## 2023-06-06 NOTE — Telephone Encounter (Signed)
 left message that rx was sent to the pharmacy

## 2023-06-10 ENCOUNTER — Telehealth: Payer: Self-pay

## 2023-06-10 ENCOUNTER — Other Ambulatory Visit: Payer: Self-pay | Admitting: Psychiatry

## 2023-06-10 MED ORDER — BUSPIRONE HCL 5 MG PO TABS: 5.0000 mg | ORAL_TABLET | Freq: Two times a day (BID) | ORAL | 0 refills | Status: DC

## 2023-06-10 NOTE — Telephone Encounter (Signed)
 The medication has been sent to be filled in April when it is due, as she should have plenty of medication for now.

## 2023-06-10 NOTE — Telephone Encounter (Signed)
 received fax requesting a refill on the buspironechcl. pt was last see on 2-27 next appt 4-22

## 2023-06-10 NOTE — Telephone Encounter (Signed)
 called [facility] in [place], states that pt last pick up rx on 1-16. # 180

## 2023-06-20 ENCOUNTER — Other Ambulatory Visit: Payer: Self-pay | Admitting: *Deleted

## 2023-06-20 DIAGNOSIS — Z3041 Encounter for surveillance of contraceptive pills: Secondary | ICD-10-CM

## 2023-06-20 MED ORDER — NORGESTIMATE-ETH ESTRADIOL 0.25-35 MG-MCG PO TABS: 1.0000 | ORAL_TABLET | Freq: Every day | ORAL | 3 refills | Status: AC

## 2023-06-20 NOTE — Progress Notes (Signed)
 Received a 90 days request from [facility]

## 2023-06-28 DIAGNOSIS — N870 Mild cervical dysplasia: Secondary | ICD-10-CM | POA: Diagnosis not present
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
## 2023-07-19 ENCOUNTER — Encounter: Payer: Self-pay | Admitting: Psychiatry

## 2023-07-19 ENCOUNTER — Ambulatory Visit (INDEPENDENT_AMBULATORY_CARE_PROVIDER_SITE_OTHER): Payer: BC Managed Care – PPO | Admitting: Psychiatry

## 2023-07-19 VITALS — BP 112/82 | HR 91 | Temp 98.4°F | Ht 62.0 in | Wt 179.4 lb

## 2023-07-19 DIAGNOSIS — F4310 Post-traumatic stress disorder, unspecified: Secondary | ICD-10-CM | POA: Diagnosis not present

## 2023-07-19 DIAGNOSIS — F3341 Major depressive disorder, recurrent, in partial remission: Secondary | ICD-10-CM | POA: Diagnosis not present

## 2023-07-19 DIAGNOSIS — F909 Attention-deficit hyperactivity disorder, unspecified type: Secondary | ICD-10-CM | POA: Diagnosis not present

## 2023-07-19 DIAGNOSIS — F411 Generalized anxiety disorder: Secondary | ICD-10-CM | POA: Diagnosis not present

## 2023-07-19 MED ORDER — AMPHETAMINE-DEXTROAMPHET ER 30 MG PO CP24: 30.0000 mg | ORAL_CAPSULE | ORAL | 0 refills | Status: DC

## 2023-07-19 MED ORDER — FLUOXETINE HCL 60 MG PO TABS: 60.0000 mg | ORAL_TABLET | Freq: Every day | ORAL | 0 refills | Status: DC

## 2023-07-19 NOTE — Patient Instructions (Signed)
 Continue fluoxetine  60 mg daily  Continue Buspar  5 mg twice a day  Continue Adderall XR 30 mg daily Consider melatonin 6 mg at night, 2 hours before going to bed Next appointment: 7/17 at 8 am

## 2023-08-02 ENCOUNTER — Other Ambulatory Visit: Payer: Self-pay | Admitting: Psychiatry

## 2023-08-03 ENCOUNTER — Other Ambulatory Visit: Payer: Self-pay | Admitting: Psychiatry

## 2023-08-04 ENCOUNTER — Other Ambulatory Visit: Payer: Self-pay | Admitting: Psychiatry

## 2023-08-04 ENCOUNTER — Telehealth: Payer: Self-pay

## 2023-08-04 MED ORDER — BUSPIRONE HCL 5 MG PO TABS: 5.0000 mg | ORAL_TABLET | Freq: Two times a day (BID) | ORAL | 0 refills | Status: AC

## 2023-08-04 NOTE — Telephone Encounter (Signed)
 Medication management - Call with patient, after she left a message she was in need of a new Buspirone  order, to inform patient a new order was sent to [facility] on 07/13/23 for a 90 day supply for patient. Patient reported she had not received this so will call [facility] to see if they are holding this on file and to request the order be mailed to her now.  Patient to call back if any issues getting refill.

## 2023-08-04 NOTE — Telephone Encounter (Signed)
Please defer to Dr.Hisada

## 2023-08-04 NOTE — Telephone Encounter (Signed)
 Medication refill request - Patient called back today to inform [facility] was not showing the order sent 07/13/23 for a 90 day supply of Buspirone  but did find a previous order and would be mailing that out today; however, they informed pt it may take a week to be delivered.  Patient requested a one week supply of Buspirone  [facility] [place] to last until her full prescription is delivered. Pt returns next on 10/13/23.

## 2023-08-04 NOTE — Telephone Encounter (Signed)
 Ordered

## 2023-08-05 NOTE — Telephone Encounter (Signed)
 Medication management - Message left for patient that Dr. Edda Goo had sent in a one week supply of Buspirone  to [facility] [place] as she requested. Informed patient to call our office back if any further issues getting medication refilled.

## 2023-09-23 ENCOUNTER — Telehealth: Payer: Self-pay

## 2023-09-23 NOTE — Telephone Encounter (Signed)
 Left message for pt to call office back regarding message left w/ answering service.
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
## 2023-10-11 ENCOUNTER — Ambulatory Visit (INDEPENDENT_AMBULATORY_CARE_PROVIDER_SITE_OTHER): Admitting: Psychiatry

## 2023-10-11 ENCOUNTER — Encounter: Payer: Self-pay | Admitting: Psychiatry

## 2023-10-11 VITALS — BP 118/74 | HR 97 | Temp 98.3°F | Ht 62.0 in | Wt 177.0 lb

## 2023-10-11 DIAGNOSIS — F909 Attention-deficit hyperactivity disorder, unspecified type: Secondary | ICD-10-CM

## 2023-10-11 DIAGNOSIS — F411 Generalized anxiety disorder: Secondary | ICD-10-CM

## 2023-10-11 DIAGNOSIS — R42 Dizziness and giddiness: Secondary | ICD-10-CM | POA: Diagnosis not present

## 2023-10-11 DIAGNOSIS — F3341 Major depressive disorder, recurrent, in partial remission: Secondary | ICD-10-CM | POA: Diagnosis not present

## 2023-10-11 DIAGNOSIS — H93293 Other abnormal auditory perceptions, bilateral: Secondary | ICD-10-CM | POA: Diagnosis not present

## 2023-10-11 DIAGNOSIS — F4310 Post-traumatic stress disorder, unspecified: Secondary | ICD-10-CM | POA: Diagnosis not present

## 2023-10-11 MED ORDER — FLUOXETINE HCL 60 MG PO TABS: 60.0000 mg | ORAL_TABLET | Freq: Every day | ORAL | 0 refills | Status: DC

## 2023-10-11 MED ORDER — AMPHETAMINE-DEXTROAMPHET ER 30 MG PO CP24: 30.0000 mg | ORAL_CAPSULE | ORAL | 0 refills | Status: DC

## 2023-10-11 MED ORDER — FLUOXETINE HCL 20 MG PO CAPS: 20.0000 mg | ORAL_CAPSULE | Freq: Every day | ORAL | 0 refills | Status: DC

## 2023-10-11 MED ORDER — BUSPIRONE HCL 5 MG PO TABS: 5.0000 mg | ORAL_TABLET | Freq: Two times a day (BID) | ORAL | 0 refills | Status: AC

## 2023-10-13 ENCOUNTER — Ambulatory Visit: Admitting: Psychiatry

## 2023-10-27 ENCOUNTER — Other Ambulatory Visit (HOSPITAL_COMMUNITY)
Admission: RE | Admit: 2023-10-27 | Discharge: 2023-10-27 | Disposition: A | Discharge status: Home / Self Care | Source: Ambulatory Visit | Attending: Family Medicine | Admitting: Family Medicine

## 2023-10-27 ENCOUNTER — Encounter: Payer: Self-pay | Admitting: Family Medicine

## 2023-10-27 ENCOUNTER — Ambulatory Visit: Admitting: Family Medicine

## 2023-10-27 VITALS — BP 120/83 | HR 82 | Wt 178.0 lb

## 2023-10-27 DIAGNOSIS — Z113 Encounter for screening for infections with a predominantly sexual mode of transmission: Secondary | ICD-10-CM

## 2023-10-27 DIAGNOSIS — R102 Pelvic and perineal pain: Secondary | ICD-10-CM | POA: Insufficient documentation

## 2023-10-27 DIAGNOSIS — N920 Excessive and frequent menstruation with regular cycle: Secondary | ICD-10-CM | POA: Diagnosis not present

## 2023-10-27 MED ORDER — DOXYCYCLINE HYCLATE 100 MG PO CAPS: 100.0000 mg | ORAL_CAPSULE | Freq: Two times a day (BID) | ORAL | 0 refills | Status: DC

## 2023-10-27 NOTE — Assessment & Plan Note (Signed)
 Check u/s to r/o adnexal pathology or PID. Doxy may help with this as well.

## 2023-10-27 NOTE — Progress Notes (Signed)
 RGYN here for problem visit   Pt has been bleeding x 2 months now associated w/ clots. Has had pain episodes of breaking out in a sweat due to discomfort most recent was just 3 days ago.

## 2023-10-27 NOTE — Progress Notes (Signed)
   Subjective:    Patient ID: Lisa Logan is a 33 y.o. female presenting with No chief complaint on file.  on 10/27/2023  HPI: Irregular cycles, bleeding x 2 months. Clots with this. On COC's and taking on time and has not missed a dose. On same brand for a while. Has noted some increased bleeding. She has some abdominal cramping as well. Very bad and broke out into a sweat and fell into the floor. Has had this repeat at least 1 time, and lasted for 1 hour.   Review of Systems  Constitutional:  Negative for chills.  Respiratory:  Negative for shortness of breath.   Cardiovascular:  Negative for chest pain.  Gastrointestinal:  Negative for abdominal pain, nausea and vomiting.  Genitourinary:  Negative for dysuria and vaginal discharge.  Skin:  Negative for rash.      Objective:    BP 120/83   Pulse 82   Wt 178 lb (80.7 kg)   BMI 32.56 kg/m  Physical Exam Exam conducted with a chaperone present.  Constitutional:      General: She is not in acute distress.    Appearance: She is well-developed.  HENT:     Head: Normocephalic and atraumatic.  Eyes:     General: No scleral icterus. Cardiovascular:     Rate and Rhythm: Normal rate.  Pulmonary:     Effort: Pulmonary effort is normal.  Abdominal:     Palpations: Abdomen is soft.  Musculoskeletal:     Cervical back: Neck supple.  Skin:    General: Skin is warm and dry.  Neurological:     Mental Status: She is alert and oriented to person, place, and time.         Assessment & Plan:   Problem List Items Addressed This Visit       Unprioritized   Menorrhagia with regular cycle   Check TSH and CBC to ensure she is not anemic or having an endocrinological explanation. Check u/s to ensure no anatomic explanation. Trial of doxy in case of endometritis. If nothing helps, consider change to a different COC with less generic variation.      Relevant Medications   doxycycline  (VIBRAMYCIN ) 100 MG capsule   Other Relevant  Orders   TSH   CBC   Pelvic pain - Primary   Check u/s to r/o adnexal pathology or PID. Doxy may help with this as well.      Relevant Medications   doxycycline  (VIBRAMYCIN ) 100 MG capsule   Other Relevant Orders   US  PELVIC COMPLETE WITH TRANSVAGINAL   Cervicovaginal ancillary only( [place])   Other Visit Diagnoses       Screen for STD (sexually transmitted disease)       Has recently reconciled with partner. Will test to ensure no PID component.   Relevant Orders   Hepatitis B surface antigen   Hepatitis C antibody   HIV Antibody (routine testing w rflx)   RPR   Cervicovaginal ancillary only( [place])       Return in about 4 weeks (around 11/24/2023) for a follow-up, needs U/S.  Glenys GORMAN Birk, MD 10/27/2023 3:23 PM

## 2023-10-27 NOTE — Assessment & Plan Note (Signed)
 Check TSH and CBC to ensure she is not anemic or having an endocrinological explanation. Check u/s to ensure no anatomic explanation. Trial of doxy in case of endometritis. If nothing helps, consider change to a different COC with less generic variation.

## 2023-10-28 ENCOUNTER — Ambulatory Visit: Payer: Self-pay | Admitting: Family Medicine

## 2023-10-28 LAB — HEPATITIS B SURFACE ANTIGEN: Hepatitis B Surface Ag: NEGATIVE

## 2023-10-28 LAB — CBC
Hematocrit: 39 % (ref 34.0–46.6)
Hemoglobin: 11.5 g/dL (ref 11.1–15.9)
MCH: 23.3 pg — ABNORMAL LOW (ref 26.6–33.0)
MCHC: 29.5 g/dL — ABNORMAL LOW (ref 31.5–35.7)
MCV: 79 fL (ref 79–97)
Platelets: 327 x10E3/uL (ref 150–450)
RBC: 4.94 x10E6/uL (ref 3.77–5.28)
RDW: 17.7 % — ABNORMAL HIGH (ref 11.7–15.4)
WBC: 8.1 x10E3/uL (ref 3.4–10.8)

## 2023-10-28 LAB — HIV ANTIBODY (ROUTINE TESTING W REFLEX): HIV Screen 4th Generation wRfx: NONREACTIVE

## 2023-10-28 LAB — RPR: RPR Ser Ql: NONREACTIVE

## 2023-10-28 LAB — HEPATITIS C ANTIBODY: Hep C Virus Ab: NONREACTIVE

## 2023-10-28 LAB — TSH: TSH: 2.22 u[IU]/mL (ref 0.450–4.500)

## 2023-10-31 ENCOUNTER — Ambulatory Visit (HOSPITAL_COMMUNITY)
Admission: RE | Admit: 2023-10-31 | Discharge: 2023-10-31 | Disposition: A | Discharge status: Home / Self Care | Source: Ambulatory Visit | Attending: Family Medicine | Admitting: Family Medicine

## 2023-10-31 DIAGNOSIS — R9389 Abnormal findings on diagnostic imaging of other specified body structures: Secondary | ICD-10-CM | POA: Diagnosis not present

## 2023-10-31 DIAGNOSIS — R102 Pelvic and perineal pain: Secondary | ICD-10-CM | POA: Diagnosis not present

## 2023-10-31 LAB — CERVICOVAGINAL ANCILLARY ONLY
Chlamydia: NEGATIVE
Comment: NEGATIVE
Comment: NEGATIVE
Comment: NORMAL
Neisseria Gonorrhea: NEGATIVE
Trichomonas: NEGATIVE

## 2023-11-03 ENCOUNTER — Ambulatory Visit (INDEPENDENT_AMBULATORY_CARE_PROVIDER_SITE_OTHER): Admitting: Nurse Practitioner

## 2023-11-03 VITALS — BP 120/80 | HR 80 | Temp 98.8°F | Ht 62.0 in | Wt 180.6 lb

## 2023-11-03 DIAGNOSIS — F909 Attention-deficit hyperactivity disorder, unspecified type: Secondary | ICD-10-CM | POA: Insufficient documentation

## 2023-11-03 DIAGNOSIS — F419 Anxiety disorder, unspecified: Secondary | ICD-10-CM | POA: Diagnosis not present

## 2023-11-03 DIAGNOSIS — G4700 Insomnia, unspecified: Secondary | ICD-10-CM | POA: Diagnosis not present

## 2023-11-03 MED ORDER — BETAMETHASONE DIPROPIONATE AUG 0.05 % EX CREA: TOPICAL_CREAM | Freq: Two times a day (BID) | CUTANEOUS | 0 refills | Status: AC

## 2023-11-03 NOTE — Assessment & Plan Note (Signed)
 History of the same with diagnosis from psychiatry.  Maintained on Adderall 30 mg daily.  Continue medication as prescribed she needs refills which she sees me I will send it in

## 2023-11-03 NOTE — Assessment & Plan Note (Signed)
 History of the same.  Patient currently maintained on melatonin Gummies over-the-counter every 8 hours as needed.  Continue

## 2023-11-03 NOTE — Progress Notes (Signed)
 Acute Office Visit  Subjective:     Patient ID: Lisa Logan, female    DOB: Jan 27, 1991, 33 y.o.   MRN: [id number]  Chief Complaint  Patient presents with   Medication Management    Pt complains of wanting adderall, lorazepam , and Buspar  transferred to PCP care from psychiatry due to financial reasons.    Medication Refill    Diprolene -AF     Patient is in today for medication management  ADHD: statse that she has been on methylphenidate  and then adderall. She has tried combo of depression and anxiety medications. States that she can notice if she misses a dose. She gets stressed easier and is more fidgety  Mood: fluoxetine  60 mg and buspar  5 mg twice daily. States that she is oding well with that. States that she will take melatonin gummies. State that she will go to sleep around 10 and will get u p aroudn 6-630. Feels rested sometimes. She does have stable work hours. She will visit her sister that is hour and a half away and will see her once a week.  States that she has a history of allergic reactions on her face. She has used the betamethasone  in the past. States that she will use a qtip amount and use it  once a week if that.  Review of Systems  Constitutional:  Negative for chills and fever.  Respiratory:  Negative for shortness of breath.   Cardiovascular:  Negative for chest pain.  Neurological:  Negative for dizziness and headaches.  Psychiatric/Behavioral:  Negative for hallucinations and suicidal ideas.         Objective:    BP 120/80   Pulse 80   Temp 98.8 F (37.1 C) (Oral)   Ht 5' 2 (1.575 m)   Wt 180 lb 9.6 oz (81.9 kg)   SpO2 99%   BMI 33.03 kg/m  BP Readings from Last 3 Encounters:  11/03/23 120/80  10/27/23 120/83  10/11/23 118/74   Wt Readings from Last 3 Encounters:  11/03/23 180 lb 9.6 oz (81.9 kg)  10/27/23 178 lb (80.7 kg)  10/11/23 177 lb (80.3 kg)   SpO2 Readings from Last 3 Encounters:  11/03/23 99%  10/11/23 99%  07/19/23 99%       Physical Exam Vitals and nursing note reviewed.  Constitutional:      Appearance: Normal appearance.  Cardiovascular:     Rate and Rhythm: Normal rate and regular rhythm.     Heart sounds: Normal heart sounds.  Pulmonary:     Effort: Pulmonary effort is normal.     Breath sounds: Normal breath sounds.  Neurological:     Mental Status: She is alert.     No results found for any visits on 11/03/23.      Assessment & Plan:   Problem List Items Addressed This Visit       Other   Anxiety - Primary   Currently maintained on fluoxetine  60 mg daily and BuSpar  5 mg twice daily.  Patient's mood seems stable and is tolerating medication well.  Was followed by psychiatry but had discussion about following up with PCP for medication management due to cost of visits.  Did review last psychiatry note.  Will refill medications when patient needs them      Insomnia   History of the same.  Patient currently maintained on melatonin Gummies over-the-counter every 8 hours as needed.  Continue      Attention deficit hyperactivity disorder (ADHD)   History of  the same with diagnosis from psychiatry.  Maintained on Adderall 30 mg daily.  Continue medication as prescribed she needs refills which she sees me I will send it in       Meds ordered this encounter  Medications   augmented betamethasone  dipropionate (DIPROLENE -AF) 0.05 % cream    Sig: Apply topically 2 (two) times daily.    Dispense:  15 g    Refill:  0    Supervising Provider:   RANDEEN HARDY A [[id number]]    Return in about 3 months (around 02/03/2024) for CPE and Labs.  Adina Crandall, NP

## 2023-11-03 NOTE — Assessment & Plan Note (Signed)
 Currently maintained on fluoxetine  60 mg daily and BuSpar  5 mg twice daily.  Patient's mood seems stable and is tolerating medication well.  Was followed by psychiatry but had discussion about following up with PCP for medication management due to cost of visits.  Did review last psychiatry note.  Will refill medications when patient needs them

## 2023-11-22 ENCOUNTER — Other Ambulatory Visit (HOSPITAL_COMMUNITY)
Admission: RE | Admit: 2023-11-22 | Discharge: 2023-11-22 | Disposition: A | Discharge status: Home / Self Care | Source: Ambulatory Visit | Attending: Family Medicine | Admitting: Family Medicine

## 2023-11-22 ENCOUNTER — Ambulatory Visit (INDEPENDENT_AMBULATORY_CARE_PROVIDER_SITE_OTHER): Admitting: Family Medicine

## 2023-11-22 VITALS — BP 111/78 | HR 80 | Wt 184.0 lb

## 2023-11-22 DIAGNOSIS — N920 Excessive and frequent menstruation with regular cycle: Secondary | ICD-10-CM

## 2023-11-22 DIAGNOSIS — Z113 Encounter for screening for infections with a predominantly sexual mode of transmission: Secondary | ICD-10-CM | POA: Diagnosis not present

## 2023-11-22 DIAGNOSIS — R102 Pelvic and perineal pain: Secondary | ICD-10-CM

## 2023-11-22 MED ORDER — NORETHIN ACE-ETH ESTRAD-FE 1-20 MG-MCG(24) PO TABS
1.0000 | ORAL_TABLET | Freq: Every day | ORAL | 11 refills | Status: AC
Start: 2023-11-22 — End: ?

## 2023-11-22 NOTE — Progress Notes (Signed)
 RGYN here for problem visit today still having irregular bleeding and pelvic pain.  Pt has tried taking tylenol  500 mg BID and the  powder of 260 mg acetaminophen  and aspirin 520mg   (See Mychart Messages)

## 2023-11-22 NOTE — Assessment & Plan Note (Addendum)
 No obvious lab or u/s abnormalities. Has done trial of doxy. She will change to Loestrin to see if this helps with her bleeding. Consider other options, like D & C/hysteroscopy if still cannot get this under control. Consider alternative to skipping all placebos as well, depending on her cycle.

## 2023-11-22 NOTE — Assessment & Plan Note (Signed)
 Has h/o severe dysmenorrhea. ? Endometriosis. ? Mycoplasma or ureaplasma. U/s shows no obvious issues. Treatment for endo is same. If pain is significant with this hormone free week, let me know. Trial of Naprosyn  bid with food, during this week.

## 2023-11-22 NOTE — Progress Notes (Signed)
    Subjective:    Patient ID: Lisa Logan is a 33 y.o. female presenting with Pain  on 11/22/2023  HPI: Sharp pains have continued. She has not felt like she might pass out. Her pain is not cyclical. Has h/o severe dysmenorrhea and she notes on continuous OC's due to this. Her bleeding has continued. She is on Orthocyclen. Has had to have a new one based on insurance changes, now on rx through the mail.  Review of Systems  Constitutional:  Negative for chills and fever.  Respiratory:  Negative for shortness of breath.   Cardiovascular:  Negative for chest pain.  Gastrointestinal:  Negative for abdominal pain, nausea and vomiting.  Genitourinary:  Negative for dysuria.  Skin:  Negative for rash.      Objective:    BP 111/78   Pulse 80   Wt 184 lb (83.5 kg)   BMI 33.65 kg/m  Physical Exam Exam conducted with a chaperone present.  Constitutional:      General: She is not in acute distress.    Appearance: She is well-developed.  HENT:     Head: Normocephalic and atraumatic.  Eyes:     General: No scleral icterus. Cardiovascular:     Rate and Rhythm: Normal rate.  Pulmonary:     Effort: Pulmonary effort is normal.  Abdominal:     Palpations: Abdomen is soft.  Musculoskeletal:     Cervical back: Neck supple.  Skin:    General: Skin is warm and dry.  Neurological:     Mental Status: She is alert and oriented to person, place, and time.         Assessment & Plan:   Problem List Items Addressed This Visit       Unprioritized   Menorrhagia with regular cycle - Primary   No obvious lab or u/s abnormalities. Has done trial of doxy. She will change to Loestrin to see if this helps with her bleeding. Consider other options, like D & C/hysteroscopy if still cannot get this under control. Consider alternative to skipping all placebos as well, depending on her cycle.      Relevant Medications   Norethindrone  Acetate-Ethinyl Estrad-FE (LOESTRIN 24 FE) 1-20 MG-MCG(24)  tablet   Pelvic pain   Has h/o severe dysmenorrhea. ? Endometriosis. ? Mycoplasma or ureaplasma. U/s shows no obvious issues. Treatment for endo is same. If pain is significant with this hormone free week, let me know. Trial of Naprosyn  bid with food, during this week.      Relevant Medications   Norethindrone  Acetate-Ethinyl Estrad-FE (LOESTRIN 24 FE) 1-20 MG-MCG(24) tablet   Other Relevant Orders   Cervicovaginal ancillary only( [facility])   Mycoplasma / ureaplasma culture     Return if symptoms worsen or fail to improve.  Glenys GORMAN Birk, MD 11/22/2023 9:15 AM

## 2023-11-23 LAB — CERVICOVAGINAL ANCILLARY ONLY
Bacterial Vaginitis (gardnerella): NEGATIVE
Candida Glabrata: NEGATIVE
Candida Vaginitis: NEGATIVE
Comment: NEGATIVE
Comment: NEGATIVE
Comment: NEGATIVE

## 2023-11-29 LAB — MYCOPLASMA / UREAPLASMA CULTURE
Mycoplasma hominis Culture: NEGATIVE
Ureaplasma urealyticum: NEGATIVE

## 2023-11-30 ENCOUNTER — Ambulatory Visit: Payer: Self-pay | Admitting: Family Medicine

## 2023-12-20 ENCOUNTER — Telehealth: Payer: Self-pay | Admitting: Psychiatry

## 2023-12-20 NOTE — Telephone Encounter (Signed)
 Patient called to cancel her 12-27-23 appointment and not to reschedule at this time.  Stating her regular provider will be able to prescribe her medications.

## 2023-12-27 ENCOUNTER — Ambulatory Visit: Admitting: Psychiatry

## 2023-12-29 ENCOUNTER — Ambulatory Visit: Admitting: Psychiatry

## 2023-12-30 ENCOUNTER — Telehealth: Payer: Self-pay

## 2023-12-30 NOTE — Telephone Encounter (Signed)
 Decline. Her care has been transferred to her PCP. But let me know if she had any issued with this.

## 2023-12-30 NOTE — Telephone Encounter (Signed)
 Received a fax from [facility] requesting  a 90 day supply of busPIRone  (BUSPAR ) 5 MG tablet    Last visit 10-11-23  Next visit no follow up visit  Called patient no answer

## 2023-12-30 NOTE — Telephone Encounter (Signed)
 I did call the patient she did not answer left a voicemail

## 2024-01-12 ENCOUNTER — Encounter: Payer: Self-pay | Admitting: Nurse Practitioner

## 2024-01-13 MED ORDER — AMPHETAMINE-DEXTROAMPHET ER 30 MG PO CP24: 30.0000 mg | ORAL_CAPSULE | ORAL | 0 refills | Status: AC

## 2024-01-13 MED ORDER — AMPHETAMINE-DEXTROAMPHET ER 30 MG PO CP24: 30.0000 mg | ORAL_CAPSULE | ORAL | 0 refills | Status: DC

## 2024-01-20 ENCOUNTER — Encounter: Payer: Self-pay | Admitting: Family Medicine

## 2024-01-25 MED ORDER — MEDROXYPROGESTERONE ACETATE 10 MG PO TABS: 10.0000 mg | ORAL_TABLET | Freq: Every day | ORAL | 3 refills | Status: DC

## 2024-01-25 MED ORDER — MEDROXYPROGESTERONE ACETATE 10 MG PO TABS: 10.0000 mg | ORAL_TABLET | Freq: Every day | ORAL | 3 refills | Status: AC

## 2024-02-02 ENCOUNTER — Encounter: Admitting: Nurse Practitioner

## 2024-02-02 NOTE — Progress Notes (Deleted)
   Established Patient Office Visit  Subjective   Patient ID: Lisa Logan, female    DOB: 03-23-1991  Age: 33 y.o. MRN: [id number]  No chief complaint on file.   HPI  ADHD: Patient currently maintained on Adderall 30 mg XR.  Patient was established with psychiatry.  Had conversation with them about being taking her medications due to financial cost to specialist.  I agreed  Mood disorder: Patient currently maintained on fluoxetine  60 mg daily.  for complete physical and follow up of chronic conditions.  Immunizations: -Tetanus: Completed in 2022 -Influenza:  -Shingles: Too young -Pneumonia: Too young - HPV: Up-to-date  Diet: Fair diet.  Exercise: No regular exercise.  Eye exam: Completes annually  Dental exam: Completes semi-annually    Colonoscopy: Completed in  Lung Cancer Screening: Completed in   Pap smear: Followed by GYN. 04/14/2023 came back positive for high risk HPV and ASCUS cells.  Patient underwent a colposcopy.  Patient needs repeat Pap in 1 year  Mammogram: Too young, currently average risk  DEXA: Too young  Sleep:    {History (Optional):23778}  ROS    Objective:     There were no vitals taken for this visit. {Vitals History (Optional):23777}  Physical Exam   No results found for any visits on 02/02/24.  {Labs (Optional):23779}  The ASCVD Risk score (Arnett DK, et al., 2019) failed to calculate for the following reasons:   The 2019 ASCVD risk score is only valid for ages 32 to 32    Assessment & Plan:   Problem List Items Addressed This Visit   None   No follow-ups on file.    Adina Crandall, NP

## 2024-02-06 ENCOUNTER — Other Ambulatory Visit: Payer: Self-pay | Admitting: Nurse Practitioner

## 2024-02-06 NOTE — Telephone Encounter (Signed)
 Copied from CRM [id number]. Topic: Clinical - Medication Refill >> Feb 06, 2024  1:53 PM Franky GRADE wrote: Medication: amphetamine -dextroamphetamine (ADDERALL XR) 30 MG 24 hr capsule [[id number]]  Has the patient contacted their pharmacy? No (Agent: If no, request that the patient contact the pharmacy for the refill. If patient does not wish to contact the pharmacy document the reason why and proceed with request.) (Agent: If yes, when and what did the pharmacy advise?)  This is the patient's preferred pharmacy:    [id number], [place] - [address] [place] [address] Phone: [phone] Fax: [phone]  Is this the correct pharmacy for this prescription? Yes If no, delete pharmacy and type the correct one.   Has the prescription been filled recently? No  Is the patient out of the medication? No  Has the patient been seen for an appointment in the last year OR does the patient have an upcoming appointment? Yes, missed physical and rescheduled for next available date on 02/27/2024  Can we respond through MyChart? Yes  Agent: Please be advised that Rx refills may take up to 3 business days. We ask that you follow-up with your pharmacy.

## 2024-02-19 ENCOUNTER — Other Ambulatory Visit: Payer: Self-pay | Admitting: Psychiatry

## 2024-02-27 ENCOUNTER — Encounter: Admitting: Nurse Practitioner

## 2024-02-27 NOTE — Progress Notes (Deleted)
   Established Patient Office Visit  Subjective   Patient ID: Lisa Logan, female    DOB: 05-24-1990  Age: 33 y.o. MRN: [id number]  No chief complaint on file.   HPI  ADHD: Patient currently maintained on Adderall 30 mg XR.  Patient was established with psychiatry.  Had conversation with them about being taking her medications due to financial cost to specialist.  I agreed  Mood disorder: Patient currently maintained on fluoxetine  60 mg daily.  for complete physical and follow up of chronic conditions.  Immunizations: -Tetanus: Completed in 2022 -Influenza:  -Shingles: Too young -Pneumonia: Too young - HPV: Up-to-date  Diet: Fair diet.  Exercise: No regular exercise.  Eye exam: Completes annually  Dental exam: Completes semi-annually    Colonoscopy: Completed in  Lung Cancer Screening: Completed in   Pap smear: Followed by GYN. 04/14/2023 came back positive for high risk HPV and ASCUS cells.  Patient underwent a colposcopy.  Patient needs repeat Pap in 1 year  Mammogram: Too young, currently average risk  DEXA: Too young  Sleep:    {History (Optional):23778}  ROS    Objective:     There were no vitals taken for this visit. {Vitals History (Optional):23777}  Physical Exam   No results found for any visits on 02/27/24.  {Labs (Optional):23779}  The ASCVD Risk score (Arnett DK, et al., 2019) failed to calculate for the following reasons:   The 2019 ASCVD risk score is only valid for ages 69 to 5    Assessment & Plan:   Problem List Items Addressed This Visit   None   No follow-ups on file.    Adina Crandall, NP

## 2024-02-28 ENCOUNTER — Encounter: Payer: Self-pay | Admitting: Nurse Practitioner

## 2024-02-28 ENCOUNTER — Ambulatory Visit: Admitting: Nurse Practitioner

## 2024-02-28 VITALS — BP 100/70 | HR 73 | Temp 98.0°F | Ht 63.25 in | Wt 168.6 lb

## 2024-02-28 DIAGNOSIS — E559 Vitamin D deficiency, unspecified: Secondary | ICD-10-CM | POA: Diagnosis not present

## 2024-02-28 DIAGNOSIS — F320 Major depressive disorder, single episode, mild: Secondary | ICD-10-CM | POA: Diagnosis not present

## 2024-02-28 DIAGNOSIS — Z13220 Encounter for screening for lipoid disorders: Secondary | ICD-10-CM

## 2024-02-28 DIAGNOSIS — G4700 Insomnia, unspecified: Secondary | ICD-10-CM

## 2024-02-28 DIAGNOSIS — Z0000 Encounter for general adult medical examination without abnormal findings: Secondary | ICD-10-CM | POA: Diagnosis not present

## 2024-02-28 DIAGNOSIS — F909 Attention-deficit hyperactivity disorder, unspecified type: Secondary | ICD-10-CM | POA: Diagnosis not present

## 2024-02-28 DIAGNOSIS — F419 Anxiety disorder, unspecified: Secondary | ICD-10-CM | POA: Diagnosis not present

## 2024-02-28 DIAGNOSIS — Z131 Encounter for screening for diabetes mellitus: Secondary | ICD-10-CM

## 2024-02-28 DIAGNOSIS — R7989 Other specified abnormal findings of blood chemistry: Secondary | ICD-10-CM

## 2024-02-28 LAB — LIPID PANEL
Cholesterol: 213 mg/dL — ABNORMAL HIGH (ref 0–200)
HDL: 69.3 mg/dL (ref >39.00)
LDL Cholesterol: 135 mg/dL — ABNORMAL HIGH (ref 0–99)
NonHDL: 143.83
Total CHOL/HDL Ratio: 3
Triglycerides: 42 mg/dL (ref 0.0–149.0)
VLDL: 8.4 mg/dL (ref 0.0–40.0)

## 2024-02-28 LAB — CBC WITH DIFFERENTIAL/PLATELET
Basophils Absolute: 0 K/uL (ref 0.0–0.1)
Basophils Relative: 0.9 % (ref 0.0–3.0)
Eosinophils Absolute: 0.1 K/uL (ref 0.0–0.7)
Eosinophils Relative: 2.2 % (ref 0.0–5.0)
HCT: 35.4 % — ABNORMAL LOW (ref 36.0–46.0)
Hemoglobin: 11.7 g/dL — ABNORMAL LOW (ref 12.0–15.0)
Lymphocytes Relative: 32 % (ref 12.0–46.0)
Lymphs Abs: 1.6 K/uL (ref 0.7–4.0)
MCHC: 33 g/dL (ref 30.0–36.0)
MCV: 77.4 fl — ABNORMAL LOW (ref 78.0–100.0)
Monocytes Absolute: 0.3 K/uL (ref 0.1–1.0)
Monocytes Relative: 6.9 % (ref 3.0–12.0)
Neutro Abs: 2.9 K/uL (ref 1.4–7.7)
Neutrophils Relative %: 58 % (ref 43.0–77.0)
Platelets: 213 K/uL (ref 150.0–400.0)
RBC: 4.58 Mil/uL (ref 3.87–5.11)
RDW: 17.9 % — ABNORMAL HIGH (ref 11.5–15.5)
WBC: 5 K/uL (ref 4.0–10.5)

## 2024-02-28 LAB — COMPREHENSIVE METABOLIC PANEL WITH GFR
ALT: 12 U/L (ref 0–35)
AST: 17 U/L (ref 0–37)
Albumin: 4.4 g/dL (ref 3.5–5.2)
Alkaline Phosphatase: 51 U/L (ref 39–117)
BUN: 17 mg/dL (ref 6–23)
CO2: 30 meq/L (ref 19–32)
Calcium: 9.6 mg/dL (ref 8.4–10.5)
Chloride: 105 meq/L (ref 96–112)
Creatinine, Ser: 0.82 mg/dL (ref 0.40–1.20)
GFR: 94.96 mL/min (ref >60.00)
Glucose, Bld: 95 mg/dL (ref 70–99)
Potassium: 4.3 meq/L (ref 3.5–5.1)
Sodium: 142 meq/L (ref 135–145)
Total Bilirubin: 0.5 mg/dL (ref 0.2–1.2)
Total Protein: 6.6 g/dL (ref 6.0–8.3)

## 2024-02-28 LAB — VITAMIN D 25 HYDROXY (VIT D DEFICIENCY, FRACTURES): VITD: 25.37 ng/mL — ABNORMAL LOW (ref 30.00–100.00)

## 2024-02-28 LAB — TSH: TSH: 1.19 u[IU]/mL (ref 0.35–5.50)

## 2024-02-28 LAB — HEMOGLOBIN A1C: Hgb A1c MFr Bld: 5.1 % (ref 4.6–6.5)

## 2024-02-28 MED ORDER — BUSPIRONE HCL 5 MG PO TABS: 5.0000 mg | ORAL_TABLET | Freq: Two times a day (BID) | ORAL | 1 refills | Status: AC

## 2024-02-28 NOTE — Assessment & Plan Note (Signed)
 Currently maintained on Adderall 30 mg daily.  PDMP reviewed updated nonopioid controlled substance agreement today and UDS.  Continue medication as prescribed

## 2024-02-28 NOTE — Assessment & Plan Note (Signed)
 History of same schedule fairly busy currently patient getting around 5 to 6 hours of sleep.

## 2024-02-28 NOTE — Progress Notes (Signed)
 Established Patient Office Visit  Subjective   Patient ID: Lisa Logan, female    DOB: 1990-09-14  Age: 33 y.o. MRN: [id number]  Chief Complaint  Patient presents with   Annual Exam   Medication Refill    Buspar  5 mg.     HPI  ADHD: Patient currently maintained on Adderall 30 mg daily. State that she is able to focus sometimes too much. Working a lot and some at home   Mood disorder: Patient currently maintained on fluoxetine  60 mg daily and buspar  5mg  BID. Has been out or the buspar  for the past week and half and has noticed a difference   Menorrhagia: Patient currently maintained on medroxyprogesterone  10 mg daily as needed and OCP. She has stopped these medications in hopes of getting pregnant   for complete physical and follow up of chronic conditions.  Immunizations: -Tetanus: Completed in 2022 -Influenza: will consider getting it at work  -Shingles: Too young -Pneumonia: Too young - HPV: Up-to-date  Diet: Fair diet. She is eating 1 meal a day that is protein focused. States that she will snack some with sweets. She is drinking water but not enough. She will drink fruit with coconut water. Bottled tea.  Exercise: No regular exercise. States that work she is moving a lot.   Eye exam: Completes annually. Wears glasses. Has it coming up in march  Dental exam: Needs updating     Colonoscopy: Too young, currently risk Lung Cancer Screening: N/A  Pap smear: 04/14/2023 with positive HPV and ASCUS.  Recommend colposcopy colposcopy done in February.  She is followed by GYN  Mammogram: Too young, currently average risk  DEXA: Too young  Sleep: states that she is trying to get at least 6 hours of sleep sometimes 5 hours. States that she does not feel rested. This is seasonal       Review of Systems  Constitutional:  Negative for chills and fever.  Respiratory:  Negative for shortness of breath.   Cardiovascular:  Negative for chest pain and leg swelling.   Gastrointestinal:  Negative for abdominal pain, blood in stool, constipation, diarrhea, nausea and vomiting.       BM every third day  Genitourinary:  Negative for dysuria and hematuria.  Neurological:  Negative for dizziness, tingling and headaches.  Psychiatric/Behavioral:  Negative for hallucinations and suicidal ideas.       Objective:     BP 100/70   Pulse 73   Temp 98 F (36.7 C) (Oral)   Ht 5' 3.25 (1.607 m)   Wt 168 lb 9.6 oz (76.5 kg)   SpO2 99%   BMI 29.63 kg/m  BP Readings from Last 3 Encounters:  02/28/24 100/70  11/22/23 111/78  11/03/23 120/80   Wt Readings from Last 3 Encounters:  02/28/24 168 lb 9.6 oz (76.5 kg)  11/22/23 184 lb (83.5 kg)  11/03/23 180 lb 9.6 oz (81.9 kg)   SpO2 Readings from Last 3 Encounters:  02/28/24 99%  11/03/23 99%  09/14/22 98%      Physical Exam Vitals and nursing note reviewed.  Constitutional:      Appearance: Normal appearance.  HENT:     Right Ear: Tympanic membrane, ear canal and external ear normal.     Left Ear: Tympanic membrane, ear canal and external ear normal.     Mouth/Throat:     Mouth: Mucous membranes are moist.     Pharynx: Oropharynx is clear.  Eyes:     Extraocular Movements: Extraocular movements  intact.     Pupils: Pupils are equal, round, and reactive to light.  Cardiovascular:     Rate and Rhythm: Normal rate and regular rhythm.     Pulses: Normal pulses.     Heart sounds: Normal heart sounds.  Pulmonary:     Effort: Pulmonary effort is normal.     Breath sounds: Normal breath sounds.  Abdominal:     General: Bowel sounds are normal. There is no distension.     Palpations: There is no mass.     Tenderness: There is no abdominal tenderness.     Hernia: No hernia is present.  Musculoskeletal:     Right lower leg: No edema.     Left lower leg: No edema.  Lymphadenopathy:     Cervical: No cervical adenopathy.  Skin:    General: Skin is warm.  Neurological:     General: No focal  deficit present.     Mental Status: She is alert.     Deep Tendon Reflexes:     Reflex Scores:      Bicep reflexes are 2+ on the right side and 2+ on the left side.      Patellar reflexes are 2+ on the right side and 2+ on the left side.    Comments: Bilateral upper and lower extremity strength 5/5  Psychiatric:        Mood and Affect: Mood normal.        Behavior: Behavior normal.        Thought Content: Thought content normal.        Judgment: Judgment normal.      No results found for any visits on 02/28/24.    The ASCVD Risk score (Arnett DK, et al., 2019) failed to calculate for the following reasons:   The 2019 ASCVD risk score is only valid for ages 30 to 19    Assessment & Plan:   Problem List Items Addressed This Visit       Other   Anxiety   History of the same.  Current fluoxetine  60 mg daily along with BuSpar  5 mg twice daily.  Continue medication as prescribed      Relevant Medications   busPIRone  (BUSPAR ) 5 MG tablet   Other Relevant Orders   TSH   Healthcare maintenance - Primary   Discussed age-appropriate immunizations and screening exams.  Did review patient's personal, surgical, social, family history.  Patient is up-to-date with all age-appropriate vaccinations he would like.  Patient will get flu vaccine at work if she decides to get it.  Patient is too young for CRC screening or breast cancer screening.  She is followed by GYN for cervical cancer screening.  Patient was given information at discharge about preventative healthcare maintenance with anticipatory guidance.      Relevant Orders   CBC with Differential/Platelet   Comprehensive metabolic panel with GFR   TSH   Depression   Currently maintained on fluoxetine  60 mg daily along with BuSpar  5 mg twice daily.  Patient denies HI/SI/AVH.  Continue medication as prescribed      Relevant Medications   busPIRone  (BUSPAR ) 5 MG tablet   Other Relevant Orders   VITAMIN D  25 Hydroxy (Vit-D  Deficiency, Fractures)   Insomnia   History of same schedule fairly busy currently patient getting around 5 to 6 hours of sleep.      Vitamin D  deficiency   History of the same.  Pending vitamin D  level today  Relevant Orders   VITAMIN D  25 Hydroxy (Vit-D Deficiency, Fractures)   Attention deficit hyperactivity disorder (ADHD)   Currently maintained on Adderall 30 mg daily.  PDMP reviewed updated nonopioid controlled substance agreement today and UDS.  Continue medication as prescribed      Relevant Orders   DRUG MONITORING, PANEL 8 WITH CONFIRMATION, URINE   Other Visit Diagnoses       Screening for diabetes mellitus       Relevant Orders   Hemoglobin A1c     Screening for lipid disorders       Relevant Orders   Lipid panel       Return in about 3 months (around 05/28/2024) for ADD/GAD.    Adina Crandall, NP

## 2024-02-28 NOTE — Assessment & Plan Note (Signed)
 Currently maintained on fluoxetine  60 mg daily along with BuSpar  5 mg twice daily.  Patient denies HI/SI/AVH.  Continue medication as prescribed

## 2024-02-28 NOTE — Assessment & Plan Note (Signed)
 Discussed age-appropriate immunizations and screening exams.  Did review patient's personal, surgical, social, family history.  Patient is up-to-date with all age-appropriate vaccinations he would like.  Patient will get flu vaccine at work if she decides to get it.  Patient is too young for CRC screening or breast cancer screening.  She is followed by GYN for cervical cancer screening.  Patient was given information at discharge about preventative healthcare maintenance with anticipatory guidance.

## 2024-02-28 NOTE — Assessment & Plan Note (Signed)
 History of the same.  Current fluoxetine  60 mg daily along with BuSpar  5 mg twice daily.  Continue medication as prescribed

## 2024-02-28 NOTE — Assessment & Plan Note (Signed)
History of the same.  Pending vitamin D level today

## 2024-02-28 NOTE — Patient Instructions (Signed)
 Nice to see you today I will be in touch with the labs once I have them Follow up with me in 3 months, sooner if you need me

## 2024-02-29 ENCOUNTER — Ambulatory Visit

## 2024-02-29 DIAGNOSIS — R7989 Other specified abnormal findings of blood chemistry: Secondary | ICD-10-CM | POA: Diagnosis not present

## 2024-02-29 DIAGNOSIS — Z113 Encounter for screening for infections with a predominantly sexual mode of transmission: Secondary | ICD-10-CM | POA: Diagnosis not present

## 2024-02-29 DIAGNOSIS — F418 Other specified anxiety disorders: Secondary | ICD-10-CM | POA: Diagnosis not present

## 2024-02-29 DIAGNOSIS — Z202 Contact with and (suspected) exposure to infections with a predominantly sexual mode of transmission: Secondary | ICD-10-CM | POA: Diagnosis not present

## 2024-02-29 DIAGNOSIS — Z3202 Encounter for pregnancy test, result negative: Secondary | ICD-10-CM | POA: Diagnosis not present

## 2024-02-29 LAB — IBC + FERRITIN
Ferritin: 3.1 ng/mL — ABNORMAL LOW (ref 10.0–291.0)
Iron: 39 ug/dL — ABNORMAL LOW (ref 42–145)
Saturation Ratios: 7.9 % — ABNORMAL LOW (ref 20.0–50.0)
TIBC: 494.2 ug/dL — ABNORMAL HIGH (ref 250.0–450.0)
Transferrin: 353 mg/dL (ref 212.0–360.0)

## 2024-02-29 LAB — VITAMIN B12: Vitamin B-12: 235 pg/mL (ref 211–911)

## 2024-02-29 LAB — FOLATE: Folate: 17.7 ng/mL (ref >5.9)

## 2024-02-29 NOTE — Addendum Note (Signed)
 Addended by: HOPE VEVA PARAS on: 02/29/2024 10:29 AM   Modules accepted: Orders

## 2024-03-01 ENCOUNTER — Ambulatory Visit: Payer: Self-pay | Admitting: Nurse Practitioner

## 2024-03-01 DIAGNOSIS — D509 Iron deficiency anemia, unspecified: Secondary | ICD-10-CM

## 2024-03-01 MED ORDER — IRON (FERROUS SULFATE) 325 (65 FE) MG PO TABS: 325.0000 mg | ORAL_TABLET | Freq: Every day | ORAL | 0 refills | Status: AC

## 2024-03-02 LAB — DRUG MONITORING, PANEL 8 WITH CONFIRMATION, URINE
6 Acetylmorphine: NEGATIVE ng/mL (ref <10)
Alcohol Metabolites: NEGATIVE ng/mL (ref <500)
Amphetamine: 5653 ng/mL — ABNORMAL HIGH (ref <250)
Amphetamines: POSITIVE ng/mL — AB (ref <500)
Benzodiazepines: NEGATIVE ng/mL (ref <100)
Buprenorphine, Urine: NEGATIVE ng/mL (ref <5)
Cocaine Metabolite: NEGATIVE ng/mL (ref <150)
Creatinine: 228 mg/dL (ref >=20.0)
MDMA: NEGATIVE ng/mL (ref <500)
Marijuana Metabolite: NEGATIVE ng/mL (ref <20)
Methamphetamine: NEGATIVE ng/mL (ref <250)
Opiates: NEGATIVE ng/mL (ref <100)
Oxidant: NEGATIVE ug/mL (ref <200)
Oxycodone: NEGATIVE ng/mL (ref <100)
pH: 6.3 (ref 4.5–9.0)

## 2024-03-02 LAB — DM TEMPLATE

## 2024-03-14 ENCOUNTER — Ambulatory Visit: Admitting: Nurse Practitioner

## 2024-03-14 VITALS — BP 112/80 | HR 74 | Temp 98.6°F | Ht 63.25 in | Wt 171.2 lb

## 2024-03-14 DIAGNOSIS — B029 Zoster without complications: Secondary | ICD-10-CM | POA: Diagnosis not present

## 2024-03-14 DIAGNOSIS — Z202 Contact with and (suspected) exposure to infections with a predominantly sexual mode of transmission: Secondary | ICD-10-CM

## 2024-03-14 MED ORDER — CETIRIZINE HCL 10 MG PO TABS: 10.0000 mg | ORAL_TABLET | Freq: Every day | ORAL | 0 refills | Status: DC

## 2024-03-14 MED ORDER — VALACYCLOVIR HCL 1 G PO TABS: 1000.0000 mg | ORAL_TABLET | Freq: Three times a day (TID) | ORAL | 0 refills | Status: AC

## 2024-03-14 NOTE — Patient Instructions (Signed)
Nice to see you today I have sent in medications to the pharmacy Follow up if you do not improve

## 2024-03-14 NOTE — Progress Notes (Signed)
 Established Patient Office Visit  Subjective   Patient ID: Lisa Logan, female    DOB: 29-Mar-1991  Age: 33 y.o. MRN: [id number]  Chief Complaint  Patient presents with   Rash    Pt complains of rash located on neck and left shoulder. Pt rash has radiated to her stomach area/side. Pt states that ex partner has syphilis. Complains she may have mouth herpes. Urgent care recommended getting tested in 6 weeks.     Discussed the use of AI scribe software for clinical note transcription with the patient, who gave verbal consent to proceed.  History of Present Illness Lisa Logan is a 33 year old female who presents with a rash and concerns about potential syphilis exposure.  She developed a rash on the front of her shoulder approximately three days ago, which began after a hot shower. The rash is itchy,  but not burning, or stinging. She has applied over-the-counter anti-itch cream with some relief. There have been no changes in detergents or body washes, although she uses a [employer] sleep spray. She recently resumed birth control but doubts it is related to the rash.  She visited urgent care after her last appointment, where a full panel was conducted, and syphilis was ruled out. Her partner,  was recently diagnosed with syphilis, but she has not had intercourse since August. She is uncertain about potential contamination from at-home IVF kits used in the past.  She has a recent diagnosis of oral herpes (HSV-1), which she associates with stress or dietary triggers like cheese. She uses a cream that resolves her symptoms and is unsure if this is related to previous mouth breakouts.  Her grandmother has Raynaud's disease, and she and her sister experience similar symptoms. She manages cold temperatures at work with heated gloves purchased online.  No fever, chills, chest pain, shortness of breath, or any discharge or weeping from the rash. She reports increased stress levels  recently.     Review of Systems  Constitutional:  Negative for chills and fever.  Respiratory:  Negative for shortness of breath.   Cardiovascular:  Negative for chest pain.  Skin:  Positive for itching and rash.      Objective:     BP 112/80   Pulse 74   Temp 98.6 F (37 C) (Oral)   Ht 5' 3.25 (1.607 m)   Wt 171 lb 3.2 oz (77.7 kg)   SpO2 96%   BMI 30.09 kg/m    Physical Exam Vitals and nursing note reviewed.  Constitutional:      Appearance: Normal appearance.  Cardiovascular:     Rate and Rhythm: Normal rate and regular rhythm.     Heart sounds: Normal heart sounds.  Pulmonary:     Effort: Pulmonary effort is normal.     Breath sounds: Normal breath sounds.  Skin:    Findings: Lesion and rash present.         Comments: Rash is patchy erythematous base and dry skin exterior.  Possible vesicle formulating  Neurological:     Mental Status: She is alert.      No results found for any visits on 03/14/24.    The ASCVD Risk score (Arnett DK, et al., 2019) failed to calculate for the following reasons:   The 2019 ASCVD risk score is only valid for ages 60 to 67    Assessment & Plan:   Problem List Items Addressed This Visit   None Visit Diagnoses  Herpes zoster without complication    -  Primary   Relevant Medications   valACYclovir  (VALTREX ) 1000 MG tablet   cetirizine  (ZYRTEC ) 10 MG tablet     Exposure to syphilis       Relevant Orders   RPR W/RFLX TO RPR TITER, TREPONEMAL AB, SCREEN AND DIAGNOSIS      Assessment and Plan Assessment & Plan Herpes zoster's without complication Rash on shoulder, itchy, likely due to increased stress given information from previous partner. Negative syphilis test at urgent care per patient report. - Continue over-the-counter anti-itch cream as needed. 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 Discussed HSV-1 and HSV-2,  transmission, and diagnosis criteria. - Avoid kissing and oral sex until lesions heal.  Exposure to syphilis -Patient's partner tested positive for syphilis.  She went to urgent care with negative syphilis test I recommend repeat in 6 weeks.  Future order placed encouraged abstinence until retest is complete and come back negative   Return if symptoms worsen or fail to improve.    Adina Crandall, NP

## 2024-03-25 ENCOUNTER — Telehealth

## 2024-03-25 ENCOUNTER — Other Ambulatory Visit: Payer: Self-pay | Admitting: Nurse Practitioner

## 2024-04-05 ENCOUNTER — Other Ambulatory Visit: Payer: Self-pay | Admitting: Nurse Practitioner

## 2024-04-05 DIAGNOSIS — B029 Zoster without complications: Secondary | ICD-10-CM

## 2024-04-25 ENCOUNTER — Other Ambulatory Visit (INDEPENDENT_AMBULATORY_CARE_PROVIDER_SITE_OTHER)

## 2024-04-25 ENCOUNTER — Other Ambulatory Visit

## 2024-04-25 DIAGNOSIS — D509 Iron deficiency anemia, unspecified: Secondary | ICD-10-CM

## 2024-04-25 DIAGNOSIS — Z202 Contact with and (suspected) exposure to infections with a predominantly sexual mode of transmission: Secondary | ICD-10-CM

## 2024-04-26 LAB — CBC
HCT: 30.3 % — ABNORMAL LOW (ref 36.0–46.0)
Hemoglobin: 9.9 g/dL — ABNORMAL LOW (ref 12.0–15.0)
MCHC: 32.8 g/dL (ref 30.0–36.0)
MCV: 77.9 fl — ABNORMAL LOW (ref 78.0–100.0)
Platelets: 289 10*3/uL (ref 150.0–400.0)
RBC: 3.88 Mil/uL (ref 3.87–5.11)
RDW: 15.4 % (ref 11.5–15.5)
WBC: 7.7 10*3/uL (ref 4.0–10.5)

## 2024-04-26 LAB — IBC + FERRITIN
Ferritin: 1.6 ng/mL — ABNORMAL LOW (ref 10.0–291.0)
Iron: 19 ug/dL — ABNORMAL LOW (ref 42–145)
Saturation Ratios: 2.9 % — ABNORMAL LOW (ref 20.0–50.0)
TIBC: 666.4 ug/dL — ABNORMAL HIGH (ref 250.0–450.0)
Transferrin: 476 mg/dL — ABNORMAL HIGH (ref 212.0–360.0)

## 2024-04-26 LAB — SYPHILIS: RPR W/REFLEX TO RPR TITER AND TREPONEMAL ANTIBODIES, TRADITIONAL SCREENING AND DIAGNOSIS ALGORITHM: RPR Ser Ql: NONREACTIVE

## 2024-04-30 ENCOUNTER — Ambulatory Visit: Payer: Self-pay | Admitting: Nurse Practitioner

## 2024-04-30 DIAGNOSIS — D509 Iron deficiency anemia, unspecified: Secondary | ICD-10-CM

## 2024-05-02 ENCOUNTER — Encounter: Payer: Self-pay | Admitting: Certified Nurse Midwife

## 2024-05-02 ENCOUNTER — Ambulatory Visit: Admitting: Certified Nurse Midwife

## 2024-05-02 VITALS — BP 119/77 | HR 72 | Wt 181.0 lb

## 2024-05-02 DIAGNOSIS — Z124 Encounter for screening for malignant neoplasm of cervix: Secondary | ICD-10-CM

## 2024-05-02 DIAGNOSIS — Z3041 Encounter for surveillance of contraceptive pills: Secondary | ICD-10-CM

## 2024-05-02 DIAGNOSIS — Z8742 Personal history of other diseases of the female genital tract: Secondary | ICD-10-CM | POA: Diagnosis not present

## 2024-05-02 DIAGNOSIS — Z01419 Encounter for gynecological examination (general) (routine) without abnormal findings: Secondary | ICD-10-CM | POA: Diagnosis not present

## 2024-05-02 NOTE — Progress Notes (Signed)
 "  GYNECOLOGY OFFICE VISIT NOTE  History:   Lisa Logan is a 34 y.o. G0P0000 here today for Annual Well Woman visit. She is currently sexually active on birth control pills. She recently restarted meds and having some breakthrough bleeding but otherwise normal. She declines STD testing today. She denies any abnormal vaginal discharge, bleeding, pelvic pain or other concerns.     Past Medical History:  Diagnosis Date   Abnormal Pap smear of cervix    Anxiety    Arm fracture    C. 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   Depression    Epilepsy (HCC)    High grade squamous intraepithelial lesion (HGSIL), grade 3 CIN, on biopsy of cervix    HPV in female    Lactose intolerance    Migraine    Obesity    SOB (shortness of breath)    Vaccine for human papilloma virus (HPV) types 6, 11, 16, and 18 administered    Vitamin D  deficiency     Past Surgical History:  Procedure Laterality Date   LEEP  2019   CIN 1-2, unclear margins    The following portions of the patient's history were reviewed and updated as appropriate: allergies, current medications, past family history, past medical history, past social history, past surgical history and problem list.   Health Maintenance:  Abnormal pap and Positive HRHPV on 04/13/24. Colpo A. CERVIX, 7 O'CLOCK, BIOPSY:  Low-grade squamous intraep- CIN-1  Mammogram not indicated at this time.   Review of Systems:  Pertinent items noted in HPI and remainder of comprehensive ROS otherwise negative.  Physical Exam:  BP 119/77   Pulse 72   Wt 181 lb (82.1 kg)   BMI 31.81 kg/m  CONSTITUTIONAL: Well-developed, well-nourished female in no acute distress.  HEENT:  Normocephalic, atraumatic. External right and left ear normal. No scleral icterus.  NECK: Normal range of motion, supple, no masses noted on observation SKIN: No rash noted. Not diaphoretic. No erythema. No pallor. MUSCULOSKELETAL: Normal range of  motion. No edema noted. NEUROLOGIC: Alert and oriented to person, place, and time. Normal muscle tone coordination. No cranial nerve deficit noted. PSYCHIATRIC: Normal mood and affect. Normal behavior. Normal judgment and thought content. CARDIOVASCULAR: Normal heart rate noted RESPIRATORY: Effort and breath sounds normal, no problems with respiration noted ABDOMEN: No masses noted. No other overt distention noted.   PELVIC: Normal appearing external genitalia; normal urethral meatus; normal appearing vaginal mucosa and cervix.  No abnormal discharge noted.  Normal uterine size, no other palpable masses, no uterine or adnexal tenderness. Performed in the presence of a chaperone [name] and Imaging Results for orders placed or performed in visit on 04/25/24 (from the past week)  RPR W/RFLX TO RPR TITER, TREPONEMAL AB, SCREEN AND DIAGNOSIS   Collection Time: 04/25/24  4:09 PM  Result Value Ref Range   RPR Ser Ql NON-REACTIVE NON-REACTIVE  IBC + Ferritin   Collection Time: 04/25/24  4:09 PM  Result Value Ref Range   Iron  19 (L) 42 - 145 ug/dL   Transferrin 523.9 (H) 212.0 - 360.0 mg/dL   Saturation Ratios 2.9 (L) 20.0 - 50.0 %   Ferritin 1.6 (L) 10.0 - 291.0 ng/mL   TIBC 666.4 (H) 250.0 - 450.0 mcg/dL  CBC   Collection Time: 04/25/24  4:09 PM  Result Value Ref Range   WBC 7.7 4.0 - 10.5 K/uL   RBC 3.88 3.87 - 5.11 Mil/uL  Platelets 289.0 150.0 - 400.0 K/uL   Hemoglobin 9.9 (L) 12.0 - 15.0 g/dL   HCT 69.6 (L) 63.9 - 53.9 %   MCV 77.9 (L) 78.0 - 100.0 fl   MCHC 32.8 30.0 - 36.0 g/dL   RDW 84.5 88.4 - 84.4 %   No results found.    Assessment and Plan:    1. Well woman exam with routine gynecological exam (Primary) - Patient overall doing well.   2. History of abnormal cervical Pap smear - repeat PAP testing completed today.  - Concern given amount of cervical biopsies, and patient desiring pregnancy later.    3. Pap smear for cervical cancer screening - Cytology -  PAP( [place])  4. Encounter for birth control pills maintenance - Continue as prescribed. If breakthrough bleeding persists, make an appointment to be seen. Patient verbalized understanding.    Routine preventative health maintenance measures emphasized. Please refer to After Visit Summary for other counseling recommendations.   Return in about 1 year (around 05/02/2025) for [place].    I spent 30 minutes dedicated to the care of this patient including pre-visit review of records, face to face time with the patient discussing her conditions and treatments and post visit orders.  Meiko Stranahan Erven) Emilio, MSN, CNM  [facility]  05/02/24 12:00 PM        "

## 2024-05-02 NOTE — Telephone Encounter (Signed)
 She will need labs rechecked 30 days after starting the iron  supplement  Orders have been placed

## 2024-05-02 NOTE — Telephone Encounter (Signed)
-----   Message from [facility] T sent at 05/01/2024  3:48 PM EST ----- Called patient reviewed all information and repeated back to me. Will call if any questions.   Pt has not started taking the iron  supplement but states she will stop by pharmacy.  ----- Message ----- From: Wendee Lynwood HERO, NP Sent: 04/30/2024  12:14 PM EST To: Wendee Gunnels  Notified via My Chart   See below

## 2024-05-29 ENCOUNTER — Ambulatory Visit: Admitting: Nurse Practitioner

## 2024-06-01 ENCOUNTER — Other Ambulatory Visit
